# Patient Record
Sex: Female | Born: 1940 | Race: White | Hispanic: No | Smoking: Former smoker
Health system: Southern US, Community
[De-identification: ages and names within clinical notes are randomized; demographics above are authoritative.]

## PROBLEM LIST (undated history)

## (undated) DIAGNOSIS — I10 Essential (primary) hypertension: Secondary | ICD-10-CM

## (undated) DIAGNOSIS — M1990 Unspecified osteoarthritis, unspecified site: Secondary | ICD-10-CM

## (undated) DIAGNOSIS — K219 Gastro-esophageal reflux disease without esophagitis: Secondary | ICD-10-CM

## (undated) DIAGNOSIS — I4891 Unspecified atrial fibrillation: Secondary | ICD-10-CM

## (undated) DIAGNOSIS — N189 Chronic kidney disease, unspecified: Secondary | ICD-10-CM

## (undated) HISTORY — PX: CHOLECYSTECTOMY: SHX55

## (undated) HISTORY — PX: EYE SURGERY: SHX253

## (undated) HISTORY — PX: TONSILLECTOMY: SUR1361

## (undated) HISTORY — PX: BREAST ENHANCEMENT SURGERY: SHX7

---

## 1998-10-26 ENCOUNTER — Other Ambulatory Visit: Admission: RE | Admit: 1998-10-26 | Discharge: 1998-10-26 | Payer: Self-pay | Admitting: Obstetrics and Gynecology

## 2000-03-07 ENCOUNTER — Encounter: Admission: RE | Admit: 2000-03-07 | Discharge: 2000-03-07 | Payer: Self-pay | Admitting: Obstetrics and Gynecology

## 2000-03-07 ENCOUNTER — Encounter: Payer: Self-pay | Admitting: Obstetrics and Gynecology

## 2000-03-15 ENCOUNTER — Other Ambulatory Visit: Admission: RE | Admit: 2000-03-15 | Discharge: 2000-03-15 | Payer: Self-pay | Admitting: Obstetrics and Gynecology

## 2002-05-07 ENCOUNTER — Other Ambulatory Visit: Admission: RE | Admit: 2002-05-07 | Discharge: 2002-05-07 | Payer: Self-pay | Admitting: Obstetrics and Gynecology

## 2004-04-06 ENCOUNTER — Other Ambulatory Visit: Admission: RE | Admit: 2004-04-06 | Discharge: 2004-04-06 | Payer: Self-pay | Admitting: Obstetrics and Gynecology

## 2004-04-08 ENCOUNTER — Ambulatory Visit: Payer: Self-pay | Admitting: Urology

## 2005-04-07 ENCOUNTER — Other Ambulatory Visit: Admission: RE | Admit: 2005-04-07 | Discharge: 2005-04-07 | Payer: Self-pay | Admitting: Obstetrics and Gynecology

## 2005-07-19 ENCOUNTER — Ambulatory Visit: Payer: Self-pay

## 2010-06-29 ENCOUNTER — Ambulatory Visit: Payer: Self-pay | Admitting: Internal Medicine

## 2012-01-12 ENCOUNTER — Ambulatory Visit: Payer: Self-pay | Admitting: Gastroenterology

## 2012-01-13 LAB — PATHOLOGY REPORT

## 2013-05-02 ENCOUNTER — Observation Stay: Payer: Self-pay | Admitting: Internal Medicine

## 2013-05-02 LAB — BASIC METABOLIC PANEL
Anion Gap: 7 (ref 7–16)
BUN: 29 mg/dL — ABNORMAL HIGH (ref 7–18)
Calcium, Total: 9.4 mg/dL (ref 8.5–10.1)
Chloride: 105 mmol/L (ref 98–107)
Co2: 27 mmol/L (ref 21–32)
Creatinine: 1.13 mg/dL (ref 0.60–1.30)
EGFR (African American): 56 — ABNORMAL LOW
EGFR (Non-African Amer.): 49 — ABNORMAL LOW
Glucose: 135 mg/dL — ABNORMAL HIGH (ref 65–99)
Osmolality: 285 (ref 275–301)
Potassium: 3.1 mmol/L — ABNORMAL LOW (ref 3.5–5.1)
Sodium: 139 mmol/L (ref 136–145)

## 2013-05-02 LAB — CK TOTAL AND CKMB (NOT AT ARMC)
CK, Total: 186 U/L (ref 21–215)
CK, Total: 135 U/L (ref 21–215)
CK-MB: 5.3 ng/mL — ABNORMAL HIGH (ref 0.5–3.6)
CK-MB: 4.6 ng/mL — ABNORMAL HIGH (ref 0.5–3.6)

## 2013-05-02 LAB — CBC
HCT: 41.2 % (ref 35.0–47.0)
HGB: 14.4 g/dL (ref 12.0–16.0)
MCH: 30.2 pg (ref 26.0–34.0)
MCHC: 35 g/dL (ref 32.0–36.0)
MCV: 86 fL (ref 80–100)
Platelet: 215 10*3/uL (ref 150–440)
RBC: 4.78 10*6/uL (ref 3.80–5.20)
RDW: 13.5 % (ref 11.5–14.5)
WBC: 7.9 10*3/uL (ref 3.6–11.0)

## 2013-05-02 LAB — HEPATIC FUNCTION PANEL A (ARMC)
Albumin: 3.5 g/dL (ref 3.4–5.0)
Alkaline Phosphatase: 91 U/L (ref 50–136)
Bilirubin, Direct: <0.1 mg/dL (ref 0.00–0.20)
Bilirubin,Total: 0.7 mg/dL (ref 0.2–1.0)
SGOT(AST): 39 U/L — ABNORMAL HIGH (ref 15–37)
SGPT (ALT): 31 U/L (ref 12–78)
Total Protein: 7 g/dL (ref 6.4–8.2)

## 2013-05-02 LAB — LIPASE, BLOOD: Lipase: 233 U/L (ref 73–393)

## 2013-05-02 LAB — TROPONIN I
Troponin-I: <0.02 ng/mL
Troponin-I: <0.02 ng/mL

## 2013-05-02 LAB — TSH: Thyroid Stimulating Horm: 2.51 u[IU]/mL

## 2013-07-02 ENCOUNTER — Ambulatory Visit: Payer: Self-pay | Admitting: Internal Medicine

## 2014-01-08 ENCOUNTER — Encounter (INDEPENDENT_AMBULATORY_CARE_PROVIDER_SITE_OTHER): Payer: Medicare Other | Admitting: Ophthalmology

## 2014-01-08 DIAGNOSIS — I10 Essential (primary) hypertension: Secondary | ICD-10-CM

## 2014-01-08 DIAGNOSIS — H43819 Vitreous degeneration, unspecified eye: Secondary | ICD-10-CM

## 2014-01-08 DIAGNOSIS — H2510 Age-related nuclear cataract, unspecified eye: Secondary | ICD-10-CM

## 2014-01-08 DIAGNOSIS — H35039 Hypertensive retinopathy, unspecified eye: Secondary | ICD-10-CM

## 2014-03-29 ENCOUNTER — Emergency Department: Payer: Self-pay | Admitting: Emergency Medicine

## 2014-03-29 LAB — CBC
HCT: 41.6 % (ref 35.0–47.0)
HGB: 14.1 g/dL (ref 12.0–16.0)
MCH: 29.6 pg (ref 26.0–34.0)
MCHC: 33.9 g/dL (ref 32.0–36.0)
MCV: 87 fL (ref 80–100)
Platelet: 186 10*3/uL (ref 150–440)
RBC: 4.77 10*6/uL (ref 3.80–5.20)
RDW: 14.4 % (ref 11.5–14.5)
WBC: 6.5 10*3/uL (ref 3.6–11.0)

## 2014-03-29 LAB — BASIC METABOLIC PANEL
Anion Gap: 9 (ref 7–16)
BUN: 13 mg/dL (ref 7–18)
CO2: 26 mmol/L (ref 21–32)
CREATININE: 0.88 mg/dL (ref 0.60–1.30)
Calcium, Total: 8.7 mg/dL (ref 8.5–10.1)
Chloride: 101 mmol/L (ref 98–107)
EGFR (Non-African Amer.): >60
Glucose: 132 mg/dL — ABNORMAL HIGH (ref 65–99)
OSMOLALITY: 274 (ref 275–301)
POTASSIUM: 3.2 mmol/L — AB (ref 3.5–5.1)
Sodium: 136 mmol/L (ref 136–145)

## 2014-03-29 LAB — TROPONIN I: Troponin-I: 0.03 ng/mL

## 2014-03-29 LAB — PROTIME-INR
INR: 0.9
Prothrombin Time: 11.8 secs (ref 11.5–14.7)

## 2014-03-29 LAB — PRO B NATRIURETIC PEPTIDE: B-Type Natriuretic Peptide: 154 pg/mL — ABNORMAL HIGH (ref 0–125)

## 2014-10-10 NOTE — H&P (Signed)
PATIENT NAME:  [name], [name] MR#:  [id number] DATE OF BIRTH:  14-May-1941  DATE OF ADMISSION:  05/02/2013  PRIMARY CARE PHYSICIAN:  Dr. Bethann Punches.   REFERRING PHYSICIAN:  Dr. Chiquita Loth.   CHIEF COMPLAINT:  Chest pain and palpitations.   HISTORY OF PRESENT ILLNESS:  [name] is a 74 year old female with a history of hypertension, gastroesophageal reflux disease, presented to the Emergency Department with complaints of palpitations and chest pain, started about midnight.  The patient is well active,  dancing 4 times a week, independent of ADLs and IADLs.  The patient states that today lawn-mowing with her push mower, cleaned all the gutters, did not experience any chest pain.  Went for dancing and came back.  The patient states ate some steak with barbecue sauce.  Came back and looking into the computer.  The patient started to experience skipped beats with a rapid heart rate.  Concerning this, the patient checked her blood pressure.  It showed 206/130.  Rechecked the blood pressure, slightly improved to 102, diastolic and systolic remained over 200.  Concerning this, called husband and is brought to the Emergency Department.  Work-up in the Emergency Department with EKG and cardiac enzymes was completely unremarkable.  The patient did not have any episodes of irregular heartbeats while in the Emergency Department.  Considering the patient's age, the decision is made to observe the patient.   PAST MEDICAL HISTORY: 1.  Hypertension.  2.  Gastroesophageal reflux disease.   PAST SURGICAL HISTORY: 1.  Cholecystectomy.  2.  Lump removal from the left breast.  3.  Tummy tuck.   ALLERGIES:   1.  MELOXICAM. 2.  PREDNISONE.  3.  BIAXIN.   HOME MEDICATIONS: 1.  Ambien 10 mg at bedtime.  2.  Vicodin every six hours.  3.  Ranitidine 150 mg once a day.  4.  Omeprazole 20 mg once a day.  5.  Lisinopril hydrochlorothiazide 1 tablet once a day.   SOCIAL HISTORY:  Smoked heavily in the past,  quit 15 years back.  Smoked about a pack a day.  Drinks alcohol about 2 cocktail drinks at dancing.  No illicit drugs.  Lives by herself, retired.   FAMILY HISTORY:  All the family members have largely lived into their 39s.  Only father died at the age of 67 from the brain tumor.   REVIEW OF SYSTEMS:  CONSTITUTIONAL:  The patient experiences some generalized weakness about a week back.  EYES:  No change in vision.  EARS, NOSE, THROAT:  No change in hearing.  RESPIRATORY:  No cough, shortness of breath.  CARDIOVASCULAR:  Has experienced some chest pain and palpitations, et Karie Soda.  GASTROINTESTINAL:  No nausea, vomiting, abdominal pain.  GENITOURINARY:  No dysuria or hematuria.  ENDOCRINE:  No polyuria or polydipsia.  SKIN:  No rash or lesions. NEUROLOGIC:  No weakness or numbness in any part of the body.   PHYSICAL EXAMINATION: GENERAL:  This is a well-built, well-nourished, age-appropriate female lying down in the bed, not in distress.  VITAL SIGNS:  Temperature 98.2, pulse 70, blood pressure 160/60, respiratory rate of 16, oxygen saturation is 98% on room air.  HEENT:  Head normocephalic, atraumatic.  Eyes, no scleral icterus.  Conjunctivae normal.  Pupils equal and react to light.  Extraocular movements are intact.  Mucous membranes moist.  No pharyngeal erythema.  NECK:  Supple.  No lymphadenopathy.  No carotid bruit.  CHEST:  Has no focal tenderness.  LUNGS:  Bilaterally clear  to auscultation.  HEART:  S1, S2 regular.  No murmurs are heard.  ABDOMEN:  Bowel sounds plus.  Soft, nontender, nondistended.  No hepatosplenomegaly.  EXTREMITIES:  No pedal edema.  Pulses 2+.  NEUROLOGIC:  The patient is alert, oriented to place, person and time.  Cranial nerves II through XII intact.  Motor 5 by 5 in upper and lower extremities.  SKIN:  No rashes or lesions.  MUSCULOSKELETAL:  Good range of motion in all the extremities.   EKG 12-lead:  Normal sinus rhythm with no ST-T wave abnormalities.    LABORATORY DATA:  CBC and CMP are completely within normal limits.  Cardiac enzymes, CK-MB of 5.3.  Troponin less than 0.02.   ASSESSMENT AND PLAN:  [name] is a 74 year old female who comes to the Emergency Department with complaints of chest discomfort, palpitations.  1.  Chest pain.  We will rule out with cardiac enzymes.  If negative, we will obtain stress test prior to discharge.  The patient's risk factors are age, tobacco use. 2.  Hypertension, accelerated.  We will continue to follow up.  Continue the home medications.  3.  Palpitations.  We will continue to follow on telemetry.    4.  Keep the patient on DVT prophylaxis with Lovenox.   TIME SPENT:  45 minutes.    ____________________________ Susa GriffinsPadmaja Evangelina Delancey, MD pv:ea D: 05/02/2013 04:56:57 ET T: 05/02/2013 06:03:35 ET JOB#: [id number]  cc: Susa GriffinsPadmaja Kaedance Magos, MD, <Dictator> Danella PentonMark F. Miller, MD Susa GriffinsPADMAJA Shields Pautz MD ELECTRONICALLY SIGNED 05/19/2013 0:34

## 2014-10-10 NOTE — Discharge Summary (Signed)
PATIENT NAME:  [name], [name] MR#:  [id number] DATE OF BIRTH:  [birth year]  DATE OF ADMISSION:  05/02/2013 DATE OF DISCHARGE: 05/02/2013   DISCHARGE DIAGNOSES:  1.  Tachyarrhythmia with hypokalemia.  2.  Osteoarthritis with lumbar disk disease.  3.  Hypertension.  4.  Gastroesophageal reflux disease.   DISCHARGE MEDICATIONS: Lisinopril/HCT 20/25 mg daily, ranitidine 150 mg at bedtime, Ambien 10 mg at bedtime, Norco 5/325 mg 1 tab q.6 hours p.r.n. pain, omeprazole 20 mg q.a.Garcia., aspirin 81 mg daily, Toprol-XL 25 mg daily and potassium chloride 10 mEq daily.   REASON FOR ADMISSION: A 74 year old female presents with tachyarrhythmic feeling and some mild chest pain. Please see H and P for HPI, past medical history and physical exam.   HOSPITAL COURSE: The patient was admitted. Enzymes were negative. Her symptoms resolved prior to EMS coming. Laboratory evaluation significant for potassium of 3.1. She was replaced with 40 mEq potassium. More than likely, with all of her activities the day prior and then dancing with volume depletion, she became hypokalemic which led to, what sounds like, ventricular ectopy or possibly transient A. fib. She will be on aspirin daily, on low-dose Toprol-XL and electrolyte replacement as the main issue. We will follow up on her potassium and magnesium. Overall prognosis is good. There is no indication for acute coronary syndrome in that she daily has a stress test in blowing leaves and dancing and she has not had any issues as an outpatient. We will proceed with stress testing if clinically warranted. ____________________________ Danella PentonMark F. Jacobi Ryant, MD mfm:aw D: 05/02/2013 07:54:24 ET T: 05/02/2013 08:14:05 ET JOB#: [id number]  cc: Danella PentonMark F. Jillianna Stanek, MD, <Dictator> Kaylan Yates Sherlene ShamsF Rodgerick Gilliand MD ELECTRONICALLY SIGNED 05/03/2013 7:57

## 2014-11-24 ENCOUNTER — Other Ambulatory Visit: Payer: Self-pay | Admitting: Surgery

## 2014-11-24 DIAGNOSIS — M1711 Unilateral primary osteoarthritis, right knee: Secondary | ICD-10-CM

## 2014-11-24 DIAGNOSIS — M4726 Other spondylosis with radiculopathy, lumbar region: Secondary | ICD-10-CM

## 2014-12-01 ENCOUNTER — Ambulatory Visit
Admission: RE | Admit: 2014-12-01 | Discharge: 2014-12-01 | Disposition: A | Discharge status: Home / Self Care | Payer: Medicare Other | Source: Ambulatory Visit | Attending: Surgery | Admitting: Surgery

## 2014-12-01 DIAGNOSIS — M1711 Unilateral primary osteoarthritis, right knee: Secondary | ICD-10-CM | POA: Insufficient documentation

## 2014-12-01 DIAGNOSIS — G544 Lumbosacral root disorders, not elsewhere classified: Secondary | ICD-10-CM | POA: Insufficient documentation

## 2014-12-01 DIAGNOSIS — S83241A Other tear of medial meniscus, current injury, right knee, initial encounter: Secondary | ICD-10-CM | POA: Insufficient documentation

## 2014-12-01 DIAGNOSIS — M5126 Other intervertebral disc displacement, lumbar region: Secondary | ICD-10-CM | POA: Insufficient documentation

## 2014-12-01 DIAGNOSIS — M25461 Effusion, right knee: Secondary | ICD-10-CM | POA: Insufficient documentation

## 2014-12-01 DIAGNOSIS — S83101A Unspecified subluxation of right knee, initial encounter: Secondary | ICD-10-CM | POA: Insufficient documentation

## 2014-12-01 DIAGNOSIS — M4726 Other spondylosis with radiculopathy, lumbar region: Secondary | ICD-10-CM

## 2014-12-01 DIAGNOSIS — M2241 Chondromalacia patellae, right knee: Secondary | ICD-10-CM | POA: Diagnosis not present

## 2014-12-01 DIAGNOSIS — M545 Low back pain: Secondary | ICD-10-CM | POA: Diagnosis present

## 2014-12-01 DIAGNOSIS — M4806 Spinal stenosis, lumbar region: Secondary | ICD-10-CM | POA: Insufficient documentation

## 2014-12-01 DIAGNOSIS — M5416 Radiculopathy, lumbar region: Secondary | ICD-10-CM | POA: Diagnosis present

## 2015-01-24 ENCOUNTER — Emergency Department: Payer: Medicare Other

## 2015-01-24 ENCOUNTER — Emergency Department
Admission: EM | Admit: 2015-01-24 | Discharge: 2015-01-24 | Disposition: A | Discharge status: Home / Self Care | Payer: Medicare Other | Attending: Emergency Medicine | Admitting: Emergency Medicine

## 2015-01-24 DIAGNOSIS — Y9341 Activity, dancing: Secondary | ICD-10-CM | POA: Insufficient documentation

## 2015-01-24 DIAGNOSIS — W010XXA Fall on same level from slipping, tripping and stumbling without subsequent striking against object, initial encounter: Secondary | ICD-10-CM | POA: Insufficient documentation

## 2015-01-24 DIAGNOSIS — S0083XA Contusion of other part of head, initial encounter: Secondary | ICD-10-CM | POA: Insufficient documentation

## 2015-01-24 DIAGNOSIS — S0990XA Unspecified injury of head, initial encounter: Secondary | ICD-10-CM

## 2015-01-24 DIAGNOSIS — Y9289 Other specified places as the place of occurrence of the external cause: Secondary | ICD-10-CM | POA: Diagnosis not present

## 2015-01-24 DIAGNOSIS — S3992XA Unspecified injury of lower back, initial encounter: Secondary | ICD-10-CM | POA: Insufficient documentation

## 2015-01-24 DIAGNOSIS — Y998 Other external cause status: Secondary | ICD-10-CM | POA: Insufficient documentation

## 2015-01-24 DIAGNOSIS — M545 Low back pain, unspecified: Secondary | ICD-10-CM

## 2015-01-24 DIAGNOSIS — W19XXXA Unspecified fall, initial encounter: Secondary | ICD-10-CM

## 2015-01-24 MED ORDER — OXYCODONE-ACETAMINOPHEN 5-325 MG PO TABS
1.0000 | ORAL_TABLET | Freq: Once | ORAL | Status: AC
  Administered 2015-01-24: 1 via ORAL
  Filled 2015-01-24: qty 1

## 2015-01-24 NOTE — ED Notes (Signed)
Patient reports she dancing and hit a bad spot in the floor and she feel backwards and struck head on the floor.  Pt reports she felt dizzy at the time.  Patient reports she took 1/2 of an aspirin today.

## 2015-01-24 NOTE — ED Provider Notes (Signed)
[facility] Emergency Department Provider Note  ____________________________________________  Time seen: Approximately 6:34 AM  I have reviewed the triage vital signs and the nursing notes.   HISTORY  Chief Complaint Head Injury    HPI [name] is a 74 y.o. female who comes in today with a head injury. The patient reports that she was dancing and hit a bad place on the floor where her foot caught and she fell. The patient reports she fell initially on her bottom and hit her head on the floor. She reports that after the fall she felt lightheaded. The patient is concerned because she is older and has a history of breast arthritis. The patient does have a history also of back pain but denies any increased to her back pain since the fall. The patient did not have any loss of consciousness but the back of her head feels sore where she hit. The patient has no blurred vision no neck pain. She reports that her bottom also feels sore possibly from the fall and the hip.The patient reports that her pain is a 4 out of 10 in intensity which is no worse than what it normally is.   No past medical history on file. Osteoarthritis  There are no active problems to display for this patient.   No past surgical history on file.  No current outpatient prescriptions on file.  Allergies Meloxicam and Prednisone  No family history on file.  Social History History  Substance Use Topics  . Smoking status: Not on file  . Smokeless tobacco: Not on file  . Alcohol Use: Not on file    Review of Systems Constitutional: No fever/chills Eyes: No visual changes. ENT: No sore throat. Cardiovascular: Denies chest pain. Respiratory: Denies shortness of breath. Gastrointestinal: No abdominal pain.  No nausea, no vomiting.  No diarrhea.  No constipation. Genitourinary: Negative for dysuria. Musculoskeletal: back pain. Skin: Negative for rash. Neurological: Negative for  headaches, focal weakness or numbness.  10-point ROS otherwise negative.  ____________________________________________   PHYSICAL EXAM:  VITAL SIGNS: ED Triage Vitals  Enc Vitals Group     BP 01/24/15 0015 180/74 mmHg     Pulse Rate 01/24/15 0015 64     Resp 01/24/15 0015 20     Temp 01/24/15 0015 98.6 F (37 C)     Temp Source 01/24/15 0015 Oral     SpO2 01/24/15 0015 97 %     Weight 01/24/15 0015 170 lb (77.111 kg)     Height 01/24/15 0015  (1.626 m)     Head Cir --      Peak Flow --      Pain Score 01/24/15 0013 0     Pain Loc --      Pain Edu? --      Excl. in GC? --    Constitutional: Alert and oriented. Well appearing and in mild distress. Eyes: Conjunctivae are normal. PERRL. EOMI. Head: Normal Hematoma to occiput. Nose: No congestion/rhinnorhea. Mouth/Throat: Mucous membranes are moist.  Oropharynx non-erythematous. Neck: No cervical spine tenderness to palpation. Cardiovascular: Normal rate, regular rhythm. Grossly normal heart sounds.  Good peripheral circulation. Respiratory: Normal respiratory effort.  No retractions. Lungs CTAB. Gastrointestinal: Soft and nontender. No distention. No abdominal bruits. No CVA tenderness. Musculoskeletal: Pelvis stable, no lower extremity tenderness to palpation, no pain with passive range of motion. Mild pain to low spine S1-S2. Neurologic:  Normal speech and language. No gross focal neurologic deficits are appreciated.  Skin:  Skin is warm, dry and intact.  Psychiatric: Mood and affect are normal.   ____________________________________________   LABS (all labs ordered are listed, but only abnormal results are displayed)  Labs Reviewed - No data to display ____________________________________________  EKG  None ____________________________________________  RADIOLOGY  CT head: Normal brain Lumbar spine: No acute lumbar spine fracture or deformity, stable grade 1 L3-for anterolisthesis on degenerative  basis ____________________________________________   PROCEDURES  Procedure(s) performed: None  Critical Care performed: No  ____________________________________________   INITIAL IMPRESSION / ASSESSMENT AND PLAN / ED COURSE  Pertinent labs & imaging results that were available during my care of the patient were reviewed by me and considered in my medical decision making (see chart for details).  The patient is a 74 year old female who comes in tonight after a fall. The patient reports that she does have some low back pain which is not much increased from her typical pain. The patient has had an epidural injection recently. The patient's x-rays are unremarkable. I did the patient a dose of Percocet. At this time the patient be discharged home to follow-up with her primary care physician. Otherwise the patient's imaging is negative. ____________________________________________   FINAL CLINICAL IMPRESSION(S) / ED DIAGNOSES  Final diagnoses:  Fall, initial encounter  Head injury, initial encounter  Midline low back pain without sciatica      Rebecka Apley, MD 01/24/15 [phone]

## 2015-01-24 NOTE — Discharge Instructions (Signed)
Back Pain, Adult Low back pain is very common. About 1 in 5 people have back pain.The cause of low back pain is rarely dangerous. The pain often gets better over time.About half of people with a sudden onset of back pain feel better in just 2 weeks. About 8 in 10 people feel better by 6 weeks.  CAUSES Some common causes of back pain include:  Strain of the muscles or ligaments supporting the spine.  Wear and tear (degeneration) of the spinal discs.  Arthritis.  Direct injury to the back. DIAGNOSIS Most of the time, the direct cause of low back pain is not known.However, back pain can be treated effectively even when the exact cause of the pain is unknown.Answering your caregiver's questions about your overall health and symptoms is one of the most accurate ways to make sure the cause of your pain is not dangerous. If your caregiver needs more information, he or she may order lab work or imaging tests (X-rays or MRIs).However, even if imaging tests show changes in your back, this usually does not require surgery. HOME CARE INSTRUCTIONS For many people, back pain returns.Since low back pain is rarely dangerous, it is often a condition that people can learn to [facility] their own.   Remain active. It is stressful on the back to sit or stand in one place. Do not sit, drive, or stand in one place for more than 30 minutes at a time. Take short walks on level surfaces as soon as pain allows.Try to increase the length of time you walk each day.  Do not stay in bed.Resting more than 1 or 2 days can delay your recovery.  Do not avoid exercise or work.Your body is made to move.It is not dangerous to be active, even though your back may hurt.Your back will likely heal faster if you return to being active before your pain is gone.  Pay attention to your body when you bend and lift. Many people have less discomfortwhen lifting if they bend their knees, keep the load close to their bodies,and  avoid twisting. Often, the most comfortable positions are those that put less stress on your recovering back.  Find a comfortable position to sleep. Use a firm mattress and lie on your side with your knees slightly bent. If you lie on your back, put a pillow under your knees.  Only take over-the-counter or prescription medicines as directed by your caregiver. Over-the-counter medicines to reduce pain and inflammation are often the most helpful.Your caregiver may prescribe muscle relaxant drugs.These medicines help dull your pain so you can more quickly return to your normal activities and healthy exercise.  Put ice on the injured area.  Put ice in a plastic bag.  Place a towel between your skin and the bag.  Leave the ice on for 15-20 minutes, 03-04 times a day for the first 2 to 3 days. After that, ice and heat may be alternated to reduce pain and spasms.  Ask your caregiver about trying back exercises and gentle massage. This may be of some benefit.  Avoid feeling anxious or stressed.Stress increases muscle tension and can worsen back pain.It is important to recognize when you are anxious or stressed and learn ways to manage it.Exercise is a great option. SEEK MEDICAL CARE IF:  You have pain that is not relieved with rest or medicine.  You have pain that does not improve in 1 week.  You have new symptoms.  You are generally not feeling well. SEEK  IMMEDIATE MEDICAL CARE IF:   You have pain that radiates from your back into your legs.  You develop new bowel or bladder control problems.  You have unusual weakness or numbness in your arms or legs.  You develop nausea or vomiting.  You develop abdominal pain.  You feel faint. Document Released: 06/06/2005 Document Revised: 12/06/2011 Document Reviewed: 10/08/2013 [facility] Patient Information 2015 [place], [place]. This information is not intended to replace advice given to you by your health care provider. Make sure you  discuss any questions you have with your health care provider.  Concussion A concussion, or closed-head injury, is a brain injury caused by a direct blow to the head or by a quick and sudden movement (jolt) of the head or neck. Concussions are usually not life-threatening. Even so, the effects of a concussion can be serious. If you have had a concussion before, you are more likely to experience concussion-like symptoms after a direct blow to the head.  CAUSES  Direct blow to the head, such as from running into another player during a soccer game, being hit in a fight, or hitting your head on a hard surface.  A jolt of the head or neck that causes the brain to move back and forth inside the skull, such as in a car crash. SIGNS AND SYMPTOMS The signs of a concussion can be hard to notice. Early on, they may be missed by you, family members, and health care providers. You may look fine but act or feel differently. Symptoms are usually temporary, but they may last for days, weeks, or even longer. Some symptoms may appear right away while others may not show up for hours or days. Every head injury is different. Symptoms include:  Mild to moderate headaches that will not go away.  A feeling of pressure inside your head.  Having more trouble than usual:  Learning or remembering things you have heard.  Answering questions.  Paying attention or concentrating.  Organizing daily tasks.  Making decisions and solving problems.  Slowness in thinking, acting or reacting, speaking, or reading.  Getting lost or being easily confused.  Feeling tired all the time or lacking energy (fatigued).  Feeling drowsy.  Sleep disturbances.  Sleeping more than usual.  Sleeping less than usual.  Trouble falling asleep.  Trouble sleeping (insomnia).  Loss of balance or feeling lightheaded or dizzy.  Nausea or vomiting.  Numbness or tingling.  Increased sensitivity  to:  Sounds.  Lights.  Distractions.  Vision problems or eyes that tire easily.  Diminished sense of taste or smell.  Ringing in the ears.  Mood changes such as feeling sad or anxious.  Becoming easily irritated or angry for little or no reason.  Lack of motivation.  Seeing or hearing things other people do not see or hear (hallucinations). DIAGNOSIS Your health care provider can usually diagnose a concussion based on a description of your injury and symptoms. He or she will ask whether you passed out (lost consciousness) and whether you are having trouble remembering events that happened right before and during your injury. Your evaluation might include:  A brain scan to look for signs of injury to the brain. Even if the test shows no injury, you may still have a concussion.  Blood tests to be sure other problems are not present. TREATMENT  Concussions are usually treated in an emergency department, in urgent care, or at a clinic. You may need to stay in the hospital overnight for further treatment.  Tell your health care provider if you are taking any medicines, including prescription medicines, over-the-counter medicines, and natural remedies. Some medicines, such as blood thinners (anticoagulants) and aspirin, may increase the chance of complications. Also tell your health care provider whether you have had alcohol or are taking illegal drugs. This information may affect treatment.  Your health care provider will send you home with important instructions to follow.  How fast you will recover from a concussion depends on many factors. These factors include how severe your concussion is, what part of your brain was injured, your age, and how healthy you were before the concussion.  Most people with mild injuries recover fully. Recovery can take time. In general, recovery is slower in older persons. Also, persons who have had a concussion in the past or have other medical  problems may find that it takes longer to recover from their current injury. HOME CARE INSTRUCTIONS General Instructions  Carefully follow the directions your health care provider gave you.  Only take over-the-counter or prescription medicines for pain, discomfort, or fever as directed by your health care provider.  Take only those medicines that your health care provider has approved.  Do not drink alcohol until your health care provider says you are well enough to do so. Alcohol and certain other drugs may slow your recovery and can put you at risk of further injury.  If it is harder than usual to remember things, write them down.  If you are easily distracted, try to do one thing at a time. For example, do not try to watch TV while fixing dinner.  Talk with family members or close friends when making important decisions.  Keep all follow-up appointments. Repeated evaluation of your symptoms is recommended for your recovery.  Watch your symptoms and tell others to do the same. Complications sometimes occur after a concussion. Older adults with a brain injury may have a higher risk of serious complications, such as a blood clot on the brain.  Tell your teachers, school nurse, school counselor, coach, athletic trainer, or work Production designer, theatre/television/film about your injury, symptoms, and restrictions. Tell them about what you can or cannot do. They should watch for:  Increased problems with attention or concentration.  Increased difficulty remembering or learning new information.  Increased time needed to complete tasks or assignments.  Increased irritability or decreased ability to cope with stress.  Increased symptoms.  Rest. Rest helps the brain to heal. Make sure you:  Get plenty of sleep at night. Avoid staying up late at night.  Keep the same bedtime hours on weekends and weekdays.  Rest during the day. Take daytime naps or rest breaks when you feel tired.  Limit activities that require a  lot of thought or concentration. These include:  Doing homework or job-related work.  Watching TV.  Working on the computer.  Avoid any situation where there is potential for another head injury (football, hockey, soccer, basketball, martial arts, downhill snow sports and horseback riding). Your condition will get worse every time you experience a concussion. You should avoid these activities until you are evaluated by the appropriate follow-up health care providers. Returning To Your Regular Activities You will need to return to your normal activities slowly, not all at once. You must give your body and brain enough time for recovery.  Do not return to sports or other athletic activities until your health care provider tells you it is safe to do so.  Ask your health care provider when  you can drive, ride a bicycle, or operate heavy machinery. Your ability to react may be slower after a brain injury. Never do these activities if you are dizzy.  Ask your health care provider about when you can return to work or school. Preventing Another Concussion It is very important to avoid another brain injury, especially before you have recovered. In rare cases, another injury can lead to permanent brain damage, brain swelling, or death. The risk of this is greatest during the first 7-10 days after a head injury. Avoid injuries by:  Wearing a seat belt when riding in a car.  Drinking alcohol only in moderation.  Wearing a helmet when biking, skiing, skateboarding, skating, or doing similar activities.  Avoiding activities that could lead to a second concussion, such as contact or recreational sports, until your health care provider says it is okay.  Taking safety measures in your home.  Remove clutter and tripping hazards from floors and stairways.  Use grab bars in bathrooms and handrails by stairs.  Place non-slip mats on floors and in bathtubs.  Improve lighting in dim areas. SEEK MEDICAL  CARE IF:  You have increased problems paying attention or concentrating.  You have increased difficulty remembering or learning new information.  You need more time to complete tasks or assignments than before.  You have increased irritability or decreased ability to cope with stress.  You have more symptoms than before. Seek medical care if you have any of the following symptoms for more than 2 weeks after your injury:  Lasting (chronic) headaches.  Dizziness or balance problems.  Nausea.  Vision problems.  Increased sensitivity to noise or light.  Depression or mood swings.  Anxiety or irritability.  Memory problems.  Difficulty concentrating or paying attention.  Sleep problems.  Feeling tired all the time. SEEK IMMEDIATE MEDICAL CARE IF:  You have severe or worsening headaches. These may be a sign of a blood clot in the brain.  You have weakness (even if only in one hand, leg, or part of the face).  You have numbness.  You have decreased coordination.  You vomit repeatedly.  You have increased sleepiness.  One pupil is larger than the other.  You have convulsions.  You have slurred speech.  You have increased confusion. This may be a sign of a blood clot in the brain.  You have increased restlessness, agitation, or irritability.  You are unable to recognize people or places.  You have neck pain.  It is difficult to wake you up.  You have unusual behavior changes.  You lose consciousness. MAKE SURE YOU:  Understand these instructions.  Will watch your condition.  Will get help right away if you are not doing well or get worse. Document Released: 08/27/2003 Document Revised: 06/11/2013 Document Reviewed: 12/27/2012 [facility] Patient Information 2015 [place], [place]. This information is not intended to replace advice given to you by your health care provider. Make sure you discuss any questions you have with your health care provider.  Fall  Prevention and Home Safety Falls cause injuries and can affect all age groups. It is possible to use preventive measures to significantly decrease the likelihood of falls. There are many simple measures which can make your home safer and prevent falls. OUTDOORS  Repair cracks and edges of walkways and driveways.  Remove high doorway thresholds.  Trim shrubbery on the main path into your home.  Have good outside lighting.  Clear walkways of tools, rocks, debris, and clutter.  Check  that handrails are not broken and are securely fastened. Both sides of steps should have handrails.  Have leaves, snow, and ice cleared regularly.  Use sand or salt on walkways during winter months.  In the garage, clean up grease or oil spills. BATHROOM  Install night lights.  Install grab bars by the toilet and in the tub and shower.  Use non-skid mats or decals in the tub or shower.  Place a plastic non-slip stool in the shower to sit on, if needed.  Keep floors dry and clean up all water on the floor immediately.  Remove soap buildup in the tub or shower on a regular basis.  Secure bath mats with non-slip, double-sided rug tape.  Remove throw rugs and tripping hazards from the floors. BEDROOMS  Install night lights.  Make sure a bedside light is easy to reach.  Do not use oversized bedding.  Keep a telephone by your bedside.  Have a firm chair with side arms to use for getting dressed.  Remove throw rugs and tripping hazards from the floor. KITCHEN  Keep handles on pots and pans turned toward the center of the stove. Use back burners when possible.  Clean up spills quickly and allow time for drying.  Avoid walking on wet floors.  Avoid hot utensils and knives.  Position shelves so they are not too high or low.  Place commonly used objects within easy reach.  If necessary, use a sturdy step stool with a grab bar when reaching.  Keep electrical cables out of the  way.  Do not use floor polish or wax that makes floors slippery. If you must use wax, use non-skid floor wax.  Remove throw rugs and tripping hazards from the floor. STAIRWAYS  Never leave objects on stairs.  Place handrails on both sides of stairways and use them. Fix any loose handrails. Make sure handrails on both sides of the stairways are as long as the stairs.  Check carpeting to make sure it is firmly attached along stairs. Make repairs to worn or loose carpet promptly.  Avoid placing throw rugs at the top or bottom of stairways, or properly secure the rug with carpet tape to prevent slippage. Get rid of throw rugs, if possible.  Have an electrician put in a light switch at the top and bottom of the stairs. OTHER FALL PREVENTION TIPS  Wear low-heel or rubber-soled shoes that are supportive and fit well. Wear closed toe shoes.  When using a stepladder, make sure it is fully opened and both spreaders are firmly locked. Do not climb a closed stepladder.  Add color or contrast paint or tape to grab bars and handrails in your home. Place contrasting color strips on first and last steps.  Learn and use mobility aids as needed. Install an electrical emergency response system.  Turn on lights to avoid dark areas. Replace light bulbs that burn out immediately. Get light switches that glow.  Arrange furniture to create clear pathways. Keep furniture in the same place.  Firmly attach carpet with non-skid or double-sided tape.  Eliminate uneven floor surfaces.  Select a carpet pattern that does not visually hide the edge of steps.  Be aware of all pets. OTHER HOME SAFETY TIPS  Set the water temperature for 120 F (48.8 C).  Keep emergency numbers on or near the telephone.  Keep smoke detectors on every level of the home and near sleeping areas. Document Released: 05/27/2002 Document Revised: 12/06/2011 Document Reviewed: 08/26/2011 [employer] Patient Information  2015  [employer]. This information is not intended to replace advice given to you by your health care provider. Make sure you discuss any questions you have with your health care provider.

## 2015-01-24 NOTE — ED Notes (Signed)
Pt reports falling last night while dancing and hitting the back of her head. Pt denies any N/V, denies any LOC. Pt denies any loss of motor function or sensory perception in her extremities. No visible injury is observed, pt reports mild to moderate tenderness at the back of her head.

## 2015-02-12 ENCOUNTER — Encounter
Admission: RE | Admit: 2015-02-12 | Discharge: 2015-02-12 | Disposition: A | Discharge status: Home / Self Care | Payer: Medicare Other | Source: Ambulatory Visit | Attending: Surgery | Admitting: Surgery

## 2015-02-12 DIAGNOSIS — Z01810 Encounter for preprocedural cardiovascular examination: Secondary | ICD-10-CM | POA: Insufficient documentation

## 2015-02-12 HISTORY — DX: Gastro-esophageal reflux disease without esophagitis: K21.9

## 2015-02-12 HISTORY — DX: Unspecified atrial fibrillation: I48.91

## 2015-02-12 HISTORY — DX: Unspecified osteoarthritis, unspecified site: M19.90

## 2015-02-12 HISTORY — DX: Chronic kidney disease, unspecified: N18.9

## 2015-02-12 HISTORY — DX: Essential (primary) hypertension: I10

## 2015-02-12 LAB — SURGICAL PCR SCREEN
MRSA, PCR: NEGATIVE
STAPHYLOCOCCUS AUREUS: NEGATIVE

## 2015-02-12 LAB — PROTIME-INR
INR: 0.91
PROTHROMBIN TIME: 12.5 s (ref 11.4–15.0)

## 2015-02-12 LAB — CBC
HCT: 39.7 % (ref 35.0–47.0)
HEMOGLOBIN: 13.2 g/dL (ref 12.0–16.0)
MCH: 27.3 pg (ref 26.0–34.0)
MCHC: 33.3 g/dL (ref 32.0–36.0)
MCV: 82.2 fL (ref 80.0–100.0)
PLATELETS: 214 10*3/uL (ref 150–440)
RBC: 4.83 MIL/uL (ref 3.80–5.20)
RDW: 14.4 % (ref 11.5–14.5)
WBC: 5.9 10*3/uL (ref 3.6–11.0)

## 2015-02-12 LAB — URINALYSIS COMPLETE WITH MICROSCOPIC (ARMC ONLY)
BILIRUBIN URINE: NEGATIVE
Glucose, UA: NEGATIVE mg/dL
HGB URINE DIPSTICK: NEGATIVE
KETONES UR: NEGATIVE mg/dL
NITRITE: NEGATIVE
PROTEIN: NEGATIVE mg/dL
Specific Gravity, Urine: 1.023 (ref 1.005–1.030)
pH: 5 (ref 5.0–8.0)

## 2015-02-12 LAB — BASIC METABOLIC PANEL
Anion gap: 7 (ref 5–15)
BUN: 12 mg/dL (ref 6–20)
CO2: 29 mmol/L (ref 22–32)
CREATININE: 0.77 mg/dL (ref 0.44–1.00)
Calcium: 9.9 mg/dL (ref 8.9–10.3)
Chloride: 105 mmol/L (ref 101–111)
GFR calc Af Amer: >60 mL/min (ref >60)
Glucose, Bld: 91 mg/dL (ref 65–99)
Potassium: 3.7 mmol/L (ref 3.5–5.1)
SODIUM: 141 mmol/L (ref 135–145)

## 2015-02-12 LAB — ABO/RH: ABO/RH(D): O NEG

## 2015-02-12 LAB — APTT: aPTT: 26 seconds (ref 24–36)

## 2015-02-12 LAB — SEDIMENTATION RATE: Sed Rate: 16 mm/hr (ref 0–30)

## 2015-02-12 LAB — TYPE AND SCREEN
ABO/RH(D): O NEG
Antibody Screen: NEGATIVE

## 2015-02-12 NOTE — Patient Instructions (Signed)
  Your procedure is scheduled on: February 24, 2015 (Tuesday) Report to Day Surgery.[facility]) To find out your arrival time please call [phone] between 1PM - 3PM on February 20, 2015(Friday).  Remember: Instructions that are not followed completely may result in serious medical risk, up to and including death, or upon the discretion of your surgeon and anesthesiologist your surgery may need to be rescheduled.    __x__ 1. Do not eat food or drink liquids after midnight. No gum chewing or hard candies.     __x__ 2. No Alcohol for 24 hours before or after surgery.   ____ 3. Bring all medications with you on the day of surgery if instructed.    _x___ 4. Notify your doctor if there is any change in your medical condition     (cold, fever, infections).     Do not wear jewelry, make-up, hairpins, clips or nail polish.  Do not wear lotions, powders, or perfumes. You may wear deodorant.  Do not shave 48 hours prior to surgery. Men may shave face and neck.  Do not bring valuables to the hospital.    [facility] is not responsible for any belongings or valuables.               Contacts, dentures or bridgework may not be worn into surgery.  Leave your suitcase in the car. After surgery it may be brought to your room.  For patients admitted to the hospital, discharge time is determined by your                treatment team.   Patients discharged the day of surgery will not be allowed to drive home.   Please read over the following fact sheets that you were given:   MRSA Information and Surgical Site Infection Prevention   ____ Take these medicines the morning of surgery with A SIP OF WATER:    1. Omeprazole  2.   3.   4.  5.  6.  ____ Fleet Enema (as directed)   __x__ Use CHG Soap as directed  ____ Use inhalers on the day of surgery  ____ Stop metformin 2 days prior to surgery    ____ Take 1/2 of usual insulin dose the night before surgery and none on the  morning of surgery.   __x__ Stop Coumadin/Plavix/aspirin on (STOP ASPIRIN ONE WEEK PRIOR TO SURGERY)  ____ Stop Anti-inflammatories on    __x__ Stop supplements until after surgery.  (STOP ALIGN, BIOTIN, CRANBERRY, OSTEO-BI FLEX NOW) ____ Bring C-Pap to the hospital.

## 2015-02-24 ENCOUNTER — Encounter: Payer: Self-pay | Admitting: *Deleted

## 2015-02-24 ENCOUNTER — Inpatient Hospital Stay: Payer: Medicare Other | Admitting: Certified Registered Nurse Anesthetist

## 2015-02-24 ENCOUNTER — Inpatient Hospital Stay
Admission: RE | Admit: 2015-02-24 | Discharge: 2015-02-27 | DRG: 470 | Disposition: A | Discharge status: Skilled Nursing Facility | Payer: Medicare Other | Source: Ambulatory Visit | Attending: Surgery | Admitting: Surgery

## 2015-02-24 ENCOUNTER — Encounter
Admission: RE | Disposition: A | Discharge status: Skilled Nursing Facility | Payer: Self-pay | Source: Ambulatory Visit | Attending: Surgery

## 2015-02-24 DIAGNOSIS — M179 Osteoarthritis of knee, unspecified: Principal | ICD-10-CM | POA: Diagnosis present

## 2015-02-24 DIAGNOSIS — I129 Hypertensive chronic kidney disease with stage 1 through stage 4 chronic kidney disease, or unspecified chronic kidney disease: Secondary | ICD-10-CM | POA: Diagnosis present

## 2015-02-24 DIAGNOSIS — K219 Gastro-esophageal reflux disease without esophagitis: Secondary | ICD-10-CM | POA: Diagnosis present

## 2015-02-24 DIAGNOSIS — Z87891 Personal history of nicotine dependence: Secondary | ICD-10-CM | POA: Diagnosis not present

## 2015-02-24 DIAGNOSIS — I4891 Unspecified atrial fibrillation: Secondary | ICD-10-CM | POA: Diagnosis present

## 2015-02-24 DIAGNOSIS — R5082 Postprocedural fever: Secondary | ICD-10-CM

## 2015-02-24 DIAGNOSIS — N189 Chronic kidney disease, unspecified: Secondary | ICD-10-CM | POA: Diagnosis present

## 2015-02-24 DIAGNOSIS — R109 Unspecified abdominal pain: Secondary | ICD-10-CM

## 2015-02-24 DIAGNOSIS — Z79899 Other long term (current) drug therapy: Secondary | ICD-10-CM | POA: Diagnosis not present

## 2015-02-24 DIAGNOSIS — Z96651 Presence of right artificial knee joint: Secondary | ICD-10-CM

## 2015-02-24 HISTORY — PX: PARTIAL KNEE ARTHROPLASTY: SHX2174

## 2015-02-24 SURGERY — ARTHROPLASTY, KNEE, UNICOMPARTMENTAL
Anesthesia: Spinal | Site: Knee | Laterality: Right | Wound class: Clean

## 2015-02-24 MED ORDER — ADULT MULTIVITAMIN W/MINERALS CH
1.0000 | ORAL_TABLET | Freq: Every day | ORAL | Status: DC
  Administered 2015-02-27: 1 via ORAL
  Filled 2015-02-24 (×5): qty 1

## 2015-02-24 MED ORDER — FLEET ENEMA 7-19 GM/118ML RE ENEM: 1.0000 | ENEMA | Freq: Once | RECTAL | Status: DC | PRN

## 2015-02-24 MED ORDER — CLINDAMYCIN PHOSPHATE 600 MG/50ML IV SOLN
600.0000 mg | Freq: Four times a day (QID) | INTRAVENOUS | Status: AC
  Administered 2015-02-24 (×3): 600 mg via INTRAVENOUS
  Filled 2015-02-24 (×3): qty 50

## 2015-02-24 MED ORDER — LIDOCAINE HCL (CARDIAC) 20 MG/ML IV SOLN
INTRAVENOUS | Status: DC | PRN
  Administered 2015-02-24: 25 mg via INTRAVENOUS

## 2015-02-24 MED ORDER — ZOLPIDEM TARTRATE 5 MG PO TABS
10.0000 mg | ORAL_TABLET | Freq: Every evening | ORAL | Status: DC | PRN
  Administered 2015-02-24 – 2015-02-25 (×2): 10 mg via ORAL
  Filled 2015-02-24 (×2): qty 2

## 2015-02-24 MED ORDER — BISACODYL 5 MG PO TBEC
5.0000 mg | DELAYED_RELEASE_TABLET | Freq: Every day | ORAL | Status: DC
  Administered 2015-02-26 – 2015-02-27 (×2): 5 mg via ORAL
  Filled 2015-02-24 (×3): qty 1

## 2015-02-24 MED ORDER — CRANBERRY 400 MG PO CAPS: 1.0000 | ORAL_CAPSULE | Freq: Every day | ORAL | Status: DC

## 2015-02-24 MED ORDER — SODIUM CHLORIDE 0.9 % IV SOLN
INTRAVENOUS | Status: DC | PRN
  Administered 2015-02-24: 60 mL

## 2015-02-24 MED ORDER — MIDAZOLAM HCL 5 MG/5ML IJ SOLN
INTRAMUSCULAR | Status: DC | PRN
  Administered 2015-02-24: 2 mg via INTRAVENOUS

## 2015-02-24 MED ORDER — EPHEDRINE SULFATE 50 MG/ML IJ SOLN
INTRAMUSCULAR | Status: DC | PRN
  Administered 2015-02-24: 5 mg via INTRAVENOUS

## 2015-02-24 MED ORDER — BUPIVACAINE-EPINEPHRINE (PF) 0.5% -1:200000 IJ SOLN
INTRAMUSCULAR | Status: DC | PRN
  Administered 2015-02-24: 30 mL via PERINEURAL

## 2015-02-24 MED ORDER — PHENYLEPHRINE HCL 10 MG/ML IJ SOLN
INTRAMUSCULAR | Status: DC | PRN
  Administered 2015-02-24: 200 ug via INTRAVENOUS
  Administered 2015-02-24 (×2): 100 ug via INTRAVENOUS
  Administered 2015-02-24: 200 ug via INTRAVENOUS
  Administered 2015-02-24: 100 ug via INTRAVENOUS

## 2015-02-24 MED ORDER — FENTANYL CITRATE (PF) 100 MCG/2ML IJ SOLN
25.0000 ug | INTRAMUSCULAR | Status: AC | PRN
  Administered 2015-02-24 (×6): 25 ug via INTRAVENOUS

## 2015-02-24 MED ORDER — METOCLOPRAMIDE HCL 5 MG/ML IJ SOLN
5.0000 mg | Freq: Three times a day (TID) | INTRAMUSCULAR | Status: DC | PRN
  Administered 2015-02-25: 10 mg via INTRAVENOUS
  Filled 2015-02-24: qty 2

## 2015-02-24 MED ORDER — FENTANYL CITRATE (PF) 100 MCG/2ML IJ SOLN
INTRAMUSCULAR | Status: DC | PRN
  Administered 2015-02-24: 100 ug via INTRAVENOUS

## 2015-02-24 MED ORDER — PROPOFOL 10 MG/ML IV BOLUS
INTRAVENOUS | Status: DC | PRN
  Administered 2015-02-24: 20 mg via INTRAVENOUS

## 2015-02-24 MED ORDER — DIPHENHYDRAMINE HCL 12.5 MG/5ML PO ELIX: 12.5000 mg | ORAL_SOLUTION | ORAL | Status: DC | PRN

## 2015-02-24 MED ORDER — HYDROMORPHONE HCL 1 MG/ML IJ SOLN
0.5000 mg | INTRAMUSCULAR | Status: DC | PRN
  Administered 2015-02-24 (×3): 0.5 mg via INTRAVENOUS
  Administered 2015-02-24: 1 mg via INTRAVENOUS
  Administered 2015-02-25: 0.5 mg via INTRAVENOUS
  Filled 2015-02-24 (×6): qty 1

## 2015-02-24 MED ORDER — NEOMYCIN-POLYMYXIN B GU 40-200000 IR SOLN
Status: DC | PRN
  Administered 2015-02-24: 16 mL

## 2015-02-24 MED ORDER — CLINDAMYCIN PHOSPHATE 900 MG/50ML IV SOLN
900.0000 mg | Freq: Once | INTRAVENOUS | Status: AC
  Administered 2015-02-24: 900 mg via INTRAVENOUS

## 2015-02-24 MED ORDER — MAGNESIUM HYDROXIDE 400 MG/5ML PO SUSP: 30.0000 mL | Freq: Every day | ORAL | Status: DC | PRN

## 2015-02-24 MED ORDER — CALCIUM CARBONATE-VITAMIN D 500-200 MG-UNIT PO TABS
1.0000 | ORAL_TABLET | Freq: Every day | ORAL | Status: DC
  Administered 2015-02-26 – 2015-02-27 (×2): 1 via ORAL
  Filled 2015-02-24 (×3): qty 1

## 2015-02-24 MED ORDER — ALIGN 4 MG PO CAPS: 1.0000 | ORAL_CAPSULE | Freq: Every day | ORAL | Status: DC

## 2015-02-24 MED ORDER — POTASSIUM CHLORIDE CRYS ER 10 MEQ PO TBCR
10.0000 meq | EXTENDED_RELEASE_TABLET | Freq: Every day | ORAL | Status: DC
  Administered 2015-02-26 – 2015-02-27 (×2): 10 meq via ORAL
  Filled 2015-02-24 (×3): qty 1

## 2015-02-24 MED ORDER — BIOTIN 5000 MCG PO CAPS: 1.0000 | ORAL_CAPSULE | Freq: Every day | ORAL | Status: DC

## 2015-02-24 MED ORDER — ONDANSETRON HCL 4 MG/2ML IJ SOLN
INTRAMUSCULAR | Status: DC | PRN
  Administered 2015-02-24: 4 mg via INTRAVENOUS

## 2015-02-24 MED ORDER — ASPIRIN EC 81 MG PO TBEC
81.0000 mg | DELAYED_RELEASE_TABLET | Freq: Every day | ORAL | Status: DC
  Administered 2015-02-24 – 2015-02-27 (×3): 81 mg via ORAL
  Filled 2015-02-24 (×3): qty 1

## 2015-02-24 MED ORDER — ACETAMINOPHEN 325 MG PO TABS
650.0000 mg | ORAL_TABLET | Freq: Four times a day (QID) | ORAL | Status: DC | PRN
  Administered 2015-02-26: 650 mg via ORAL
  Filled 2015-02-24: qty 2

## 2015-02-24 MED ORDER — TRANEXAMIC ACID 1000 MG/10ML IV SOLN
1000.0000 mg | INTRAVENOUS | Status: DC | PRN
  Administered 2015-02-24: 1000 mg via INTRAVENOUS

## 2015-02-24 MED ORDER — ONDANSETRON HCL 4 MG/2ML IJ SOLN: 4.0000 mg | Freq: Once | INTRAMUSCULAR | Status: DC | PRN

## 2015-02-24 MED ORDER — GLYCOPYRROLATE 0.2 MG/ML IJ SOLN
INTRAMUSCULAR | Status: DC | PRN
  Administered 2015-02-24: .15 mg via INTRAVENOUS

## 2015-02-24 MED ORDER — ONDANSETRON HCL 4 MG PO TABS
4.0000 mg | ORAL_TABLET | Freq: Four times a day (QID) | ORAL | Status: DC | PRN
  Administered 2015-02-26 – 2015-02-27 (×2): 4 mg via ORAL
  Filled 2015-02-24 (×2): qty 1

## 2015-02-24 MED ORDER — BISACODYL 10 MG RE SUPP
10.0000 mg | Freq: Every day | RECTAL | Status: DC | PRN
  Administered 2015-02-25: 10 mg via RECTAL
  Filled 2015-02-24: qty 1

## 2015-02-24 MED ORDER — INFLUENZA VAC SPLIT QUAD 0.5 ML IM SUSY
0.5000 mL | PREFILLED_SYRINGE | INTRAMUSCULAR | Status: AC
  Administered 2015-02-26: 0.5 mL via INTRAMUSCULAR
  Filled 2015-02-24: qty 0.5

## 2015-02-24 MED ORDER — OXYCODONE HCL 5 MG PO TABS
5.0000 mg | ORAL_TABLET | ORAL | Status: DC | PRN
  Administered 2015-02-24 – 2015-02-25 (×7): 5 mg via ORAL
  Administered 2015-02-26 – 2015-02-27 (×6): 10 mg via ORAL
  Filled 2015-02-24 (×2): qty 1
  Filled 2015-02-24 (×2): qty 2
  Filled 2015-02-24: qty 1
  Filled 2015-02-24: qty 2
  Filled 2015-02-24: qty 1
  Filled 2015-02-24 (×2): qty 2
  Filled 2015-02-24: qty 1
  Filled 2015-02-24: qty 2
  Filled 2015-02-24: qty 1
  Filled 2015-02-24 (×2): qty 2

## 2015-02-24 MED ORDER — ESTRADIOL 0.1 MG/GM VA CREA
1.0000 | TOPICAL_CREAM | Freq: Every day | VAGINAL | Status: DC
  Administered 2015-02-25 – 2015-02-26 (×2): 1 via VAGINAL
  Filled 2015-02-24: qty 42.5

## 2015-02-24 MED ORDER — KCL IN DEXTROSE-NACL 20-5-0.9 MEQ/L-%-% IV SOLN
INTRAVENOUS | Status: DC
  Administered 2015-02-24 – 2015-02-25 (×3): via INTRAVENOUS
  Filled 2015-02-24 (×10): qty 1000

## 2015-02-24 MED ORDER — LACTATED RINGERS IV SOLN
INTRAVENOUS | Status: DC
  Administered 2015-02-24 (×2): via INTRAVENOUS

## 2015-02-24 MED ORDER — LISINOPRIL 20 MG PO TABS
20.0000 mg | ORAL_TABLET | Freq: Every day | ORAL | Status: DC
  Administered 2015-02-24 – 2015-02-26 (×2): 20 mg via ORAL
  Filled 2015-02-24 (×3): qty 1

## 2015-02-24 MED ORDER — OSTEO BI-FLEX TRIPLE STRENGTH PO TABS: 1.0000 | ORAL_TABLET | Freq: Two times a day (BID) | ORAL | Status: DC

## 2015-02-24 MED ORDER — PANTOPRAZOLE SODIUM 40 MG PO TBEC
40.0000 mg | DELAYED_RELEASE_TABLET | Freq: Two times a day (BID) | ORAL | Status: DC
  Administered 2015-02-24 – 2015-02-27 (×5): 40 mg via ORAL
  Filled 2015-02-24 (×6): qty 1

## 2015-02-24 MED ORDER — NYSTATIN-TRIAMCINOLONE 100000-0.1 UNIT/GM-% EX CREA: 1.0000 "application " | TOPICAL_CREAM | CUTANEOUS | Status: DC | PRN

## 2015-02-24 MED ORDER — DOCUSATE SODIUM 100 MG PO CAPS
100.0000 mg | ORAL_CAPSULE | Freq: Two times a day (BID) | ORAL | Status: DC
  Administered 2015-02-24 – 2015-02-27 (×6): 100 mg via ORAL
  Filled 2015-02-24 (×7): qty 1

## 2015-02-24 MED ORDER — FLUTICASONE PROPIONATE 50 MCG/ACT NA SUSP
1.0000 | Freq: Every day | NASAL | Status: DC
  Administered 2015-02-25 – 2015-02-26 (×2): 1 via NASAL
  Filled 2015-02-24: qty 16

## 2015-02-24 MED ORDER — ONDANSETRON HCL 4 MG/2ML IJ SOLN
4.0000 mg | Freq: Four times a day (QID) | INTRAMUSCULAR | Status: DC | PRN
  Administered 2015-02-24 – 2015-02-27 (×4): 4 mg via INTRAVENOUS
  Filled 2015-02-24 (×5): qty 2

## 2015-02-24 MED ORDER — HYDROCHLOROTHIAZIDE 12.5 MG PO CAPS
12.5000 mg | ORAL_CAPSULE | Freq: Every day | ORAL | Status: DC
  Administered 2015-02-24 – 2015-02-26 (×2): 12.5 mg via ORAL
  Filled 2015-02-24 (×3): qty 1

## 2015-02-24 MED ORDER — ACETAMINOPHEN 650 MG RE SUPP: 650.0000 mg | Freq: Four times a day (QID) | RECTAL | Status: DC | PRN

## 2015-02-24 MED ORDER — ENOXAPARIN SODIUM 40 MG/0.4ML ~~LOC~~ SOLN
40.0000 mg | SUBCUTANEOUS | Status: DC
  Administered 2015-02-25 – 2015-02-27 (×3): 40 mg via SUBCUTANEOUS
  Filled 2015-02-24 (×3): qty 0.4

## 2015-02-24 MED ORDER — VITAMIN D 1000 UNITS PO TABS
1000.0000 [IU] | ORAL_TABLET | Freq: Every day | ORAL | Status: DC
  Administered 2015-02-26 – 2015-02-27 (×2): 1000 [IU] via ORAL
  Filled 2015-02-24 (×3): qty 1

## 2015-02-24 MED ORDER — LISINOPRIL-HYDROCHLOROTHIAZIDE 20-12.5 MG PO TABS: 1.0000 | ORAL_TABLET | Freq: Every day | ORAL | Status: DC

## 2015-02-24 MED ORDER — POLYVINYL ALCOHOL 1.4 % OP SOLN
1.0000 [drp] | Freq: Two times a day (BID) | OPHTHALMIC | Status: DC
  Administered 2015-02-24 – 2015-02-26 (×5): 1 [drp] via OPHTHALMIC
  Filled 2015-02-24: qty 15

## 2015-02-24 MED ORDER — METOCLOPRAMIDE HCL 5 MG PO TABS
5.0000 mg | ORAL_TABLET | Freq: Three times a day (TID) | ORAL | Status: DC | PRN
  Filled 2015-02-24: qty 2

## 2015-02-24 MED ORDER — PSYLLIUM 95 % PO PACK
1.0000 | PACK | Freq: Every day | ORAL | Status: DC
  Administered 2015-02-24 – 2015-02-27 (×3): 1 via ORAL
  Filled 2015-02-24 (×4): qty 1

## 2015-02-24 MED ORDER — PROPOFOL INFUSION 10 MG/ML OPTIME
INTRAVENOUS | Status: DC | PRN
Start: 2015-02-24 — End: 2015-02-24
  Administered 2015-02-24: 70 ug/kg/min via INTRAVENOUS

## 2015-02-24 SURGICAL SUPPLY — 74 items
BANDAGE ELASTIC 6 CLIP ST LF (GAUZE/BANDAGES/DRESSINGS) ×3 IMPLANT
BIT DRILL QUICK REL 1/8 2PK SL (DRILL) ×1 IMPLANT
BLADE SURG SZ10 CARB STEEL (BLADE) ×12 IMPLANT
BNDG COHESIVE 4X5 TAN STRL (GAUZE/BANDAGES/DRESSINGS) ×3 IMPLANT
BNDG COHESIVE 6X5 TAN STRL LF (GAUZE/BANDAGES/DRESSINGS) ×3 IMPLANT
BNDG ESMARK 6X12 TAN STRL LF (GAUZE/BANDAGES/DRESSINGS) ×3 IMPLANT
BONE CEMENT PALACOSE (Orthopedic Implant) ×3 IMPLANT
BOWL CEMENT MIX W SPATULA BONE (MISCELLANEOUS) ×3 IMPLANT
CANISTER SUCT 1200ML W/VALVE (MISCELLANEOUS) ×3 IMPLANT
CANISTER SUCT 3000ML (MISCELLANEOUS) ×3 IMPLANT
CAPT KNEE PARTIAL 2 ×3 IMPLANT
CATH FOL LEG HOLDER (MISCELLANEOUS) ×3 IMPLANT
CATH TRAY METER 16FR LF (MISCELLANEOUS) ×3 IMPLANT
CEMENT BONE PALACOSE (Orthopedic Implant) ×1 IMPLANT
CHLORAPREP W/TINT 26ML (MISCELLANEOUS) ×6 IMPLANT
CNTNR SPEC C3OZ STD GRAD LEK (MISCELLANEOUS) IMPLANT
CONT SPEC 3OZ W/LID STRL (MISCELLANEOUS)
COOLER POLAR GLACIER W/PUMP (MISCELLANEOUS) ×3 IMPLANT
COVER MAYO STAND STRL (DRAPES) ×3 IMPLANT
DRAPE C-ARM XRAY 36X54 (DRAPES) ×3 IMPLANT
DRAPE INCISE IOBAN 66X45 STRL (DRAPES) ×3 IMPLANT
DRAPE TABLE BACK 80X90 (DRAPES) ×3 IMPLANT
DRILL QUICK RELEASE 1/8 INCH (DRILL) ×2
DRSG OPSITE POSTOP 4X12 (GAUZE/BANDAGES/DRESSINGS) IMPLANT
DRSG OPSITE POSTOP 4X14 (GAUZE/BANDAGES/DRESSINGS) IMPLANT
DRSG OPSITE POSTOP 4X6 (GAUZE/BANDAGES/DRESSINGS) ×3 IMPLANT
ELECT CAUTERY BLADE 6.4 (BLADE) ×3 IMPLANT
GAUZE PETRO XEROFOAM 1X8 (MISCELLANEOUS) IMPLANT
GAUZE SPONGE 4X4 12PLY STRL (GAUZE/BANDAGES/DRESSINGS) IMPLANT
GLOVE BIO SURGEON STRL SZ7.5 (GLOVE) ×6 IMPLANT
GLOVE BIO SURGEON STRL SZ8 (GLOVE) ×6 IMPLANT
GLOVE BIOGEL PI IND STRL 8 (GLOVE) ×1 IMPLANT
GLOVE BIOGEL PI INDICATOR 8 (GLOVE) ×2
GLOVE INDICATOR 8.0 STRL GRN (GLOVE) ×3 IMPLANT
GOWN STRL REUS W/ TWL LRG LVL3 (GOWN DISPOSABLE) ×2 IMPLANT
GOWN STRL REUS W/ TWL XL LVL3 (GOWN DISPOSABLE) ×1 IMPLANT
GOWN STRL REUS W/TWL LRG LVL3 (GOWN DISPOSABLE) ×4
GOWN STRL REUS W/TWL XL LVL3 (GOWN DISPOSABLE) ×2
GRADUATE 1200CC STRL 31836 (MISCELLANEOUS) ×3 IMPLANT
HANDLE YANKAUER SUCT BULB TIP (MISCELLANEOUS) ×3 IMPLANT
HANDPIECE SUCTION TUBG SURGILV (MISCELLANEOUS) ×3 IMPLANT
HOOD PEEL AWAY FACE SHEILD DIS (HOOD) ×9 IMPLANT
MAT BLUE FLOOR 46X72 FLO (MISCELLANEOUS) ×3 IMPLANT
NDL SAFETY 18GX1.5 (NEEDLE) ×3 IMPLANT
NEEDLE SPNL 18GX3.5 QUINCKE PK (NEEDLE) ×3 IMPLANT
NEEDLE SPNL 20GX3.5 QUINCKE YW (NEEDLE) ×3 IMPLANT
NS IRRIG 1000ML POUR BTL (IV SOLUTION) ×3 IMPLANT
PACK ARTHROSCOPY KNEE (MISCELLANEOUS) ×3 IMPLANT
PACK BLADE SAW RECIP 70 3 PT (BLADE) ×3 IMPLANT
PAD GROUND ADULT SPLIT (MISCELLANEOUS) ×3 IMPLANT
PAD WRAPON POLAR KNEE (MISCELLANEOUS) ×1 IMPLANT
PADDING CAST 4IN STRL (MISCELLANEOUS)
PADDING CAST BLEND 4X4 STRL (MISCELLANEOUS) IMPLANT
PENCIL ELECTRO HAND CTR (MISCELLANEOUS) ×3 IMPLANT
SOL .9 NS 3000ML IRR  AL (IV SOLUTION) ×2
SOL .9 NS 3000ML IRR UROMATIC (IV SOLUTION) ×1 IMPLANT
SPONGE LAP 18X18 5 PK (GAUZE/BANDAGES/DRESSINGS) ×6 IMPLANT
SPONGE XRAY 4X4 16PLY STRL (MISCELLANEOUS) ×3 IMPLANT
STAPLER SKIN PROX 35W (STAPLE) ×3 IMPLANT
STRAP SAFETY BODY (MISCELLANEOUS) ×3 IMPLANT
SUCTION FRAZIER TIP 10 FR DISP (SUCTIONS) ×3 IMPLANT
SUT VIC AB 0 CT1 36 (SUTURE) ×3 IMPLANT
SUT VIC AB 2-0 CT1 27 (SUTURE) ×4
SUT VIC AB 2-0 CT1 TAPERPNT 27 (SUTURE) ×2 IMPLANT
SUT VIC AB 2-0 CT2 27 (SUTURE) ×3 IMPLANT
SYR 20CC LL (SYRINGE) ×3 IMPLANT
SYR 30ML LL (SYRINGE) ×9 IMPLANT
SYR BULB IRRIG 60ML STRL (SYRINGE) ×3 IMPLANT
SYRINGE 10CC LL (SYRINGE) ×3 IMPLANT
TAPE TRANSPORE STRL 2 31045 (GAUZE/BANDAGES/DRESSINGS) IMPLANT
TUBING CONNECTING 10 (TUBING) ×2 IMPLANT
TUBING CONNECTING 10' (TUBING) ×1
WATER STERILE IRR 1000ML POUR (IV SOLUTION) IMPLANT
WRAPON POLAR PAD KNEE (MISCELLANEOUS) ×3

## 2015-02-24 NOTE — Evaluation (Signed)
Physical Therapy Evaluation Patient Details Name: [name] MRN: [id number] DOB: 1941/04/25 Today's Date: 02/24/2015   History of Present Illness  Pt is a 74 yo female who was admitted to the hospital s/p R partial knee replacement on 02/24/15  Clinical Impression  Pt presents with hx of A-fib, HTN, CKD, GERD, and arthritis. Examination reveals that pt performs bed mobility with min assist, transfers with CGA, and ambulation with CGA. Per Dr. Joice Lofts, pt is full weight bearing status, so mobility was performed as such. Pt also performed mobility on room air after she was found to be at 97% O2 sat at rest on room air. Following ambulation she was still at 97%, so was left off of the O2. Pt is a very pleasant person to work with and is motivated to participated in therapy. She has acute ROM, strength, and mobility deficits that will continue to benefit from skilled PT in order for her to safely return home. Pt was able to perform 10 AROM SLRs successfully and therefore no KI was donned for mobility.     Follow Up Recommendations Home health PT    Equipment Recommendations  None recommended by PT    Recommendations for Other Services       Precautions / Restrictions Precautions Precautions: Fall Restrictions Weight Bearing Restrictions: Yes RLE Weight Bearing: Weight bearing as tolerated      Mobility  Bed Mobility Overal bed mobility: Needs Assistance Bed Mobility: Supine to Sit     Supine to sit: Min assist     General bed mobility comments: Pt performs bed mobility with good strength and trunk control getting to EOB. She requires assist to guide leg to floor. Also needs cues on hand placement  Transfers Overall transfer level: Needs assistance Equipment used: Rolling walker (2 wheeled) Transfers: Sit to/from Stand Sit to Stand: Min guard         General transfer comment: Pt shows good strength in LLE and arms to get herself into standing with good confidence in her  movement. No LOB getting into standing  Ambulation/Gait Ambulation/Gait assistance: Min guard Ambulation Distance (Feet): 3 Feet Assistive device: Rolling walker (2 wheeled) Gait Pattern/deviations: Decreased step length - right;Decreased step length - left;Step-to pattern;Decreased stride length;Shuffle Gait velocity: decreased   General Gait Details: Pt ambulates with cues for sequencing of RW with gait. No buckling noted.    Stairs            Wheelchair Mobility    Modified Rankin (Stroke Patients Only)       Balance Overall balance assessment: No apparent balance deficits (not formally assessed)                                           Pertinent Vitals/Pain Pain Assessment: 0-10 Pain Score: 5  Pain Location: R hip (not operative site) Pain Descriptors / Indicators: Constant Pain Intervention(s): Limited activity within patient's tolerance;Monitored during session;Premedicated before session;Ice applied    Home Living Family/patient expects to be discharged to:: Private residence Living Arrangements: Alone Available Help at Discharge: Family;Available 24 hours/day Type of Home: House Home Access: Stairs to enter Entrance Stairs-Rails: Can reach both Entrance Stairs-Number of Steps: 2 Home Layout: One level   Additional Comments: Pt lives alone but daughter will be providing assistance after discharge     Prior Function Level of Independence: Independent  Comments: Pt was ambulating and very active in the community.      Hand Dominance        Extremity/Trunk Assessment   Upper Extremity Assessment: Overall WFL for tasks assessed           Lower Extremity Assessment: Overall WFL for tasks assessed;RLE deficits/detail RLE Deficits / Details: Pt gross MMT at least 3/5 in RLE.       Communication   Communication: No difficulties  Cognition Arousal/Alertness: Awake/alert Behavior During Therapy: WFL for tasks  assessed/performed Overall Cognitive Status: Within Functional Limits for tasks assessed                      General Comments      Exercises Total Joint Exercises Goniometric ROM: R knee AAROM: 5 - 86 degrees Other Exercises Other Exercises: Pt performed therex on RLE x 10 reps with supervision for proper technique. Exercises included: ankle pumps, quad sets, glute sets, SLR, hip abd/add      Assessment/Plan    PT Assessment Patient needs continued PT services  PT Diagnosis Difficulty walking;Abnormality of gait;Acute pain   PT Problem List Decreased strength;Decreased range of motion;Decreased activity tolerance;Decreased knowledge of use of DME;Decreased safety awareness;Decreased knowledge of precautions;Decreased mobility  PT Treatment Interventions DME instruction;Gait training;Stair training;Functional mobility training;Therapeutic activities;Therapeutic exercise;Balance training;Neuromuscular re-education;Cognitive remediation;Patient/family education;Manual techniques   PT Goals (Current goals can be found in the Care Plan section) Acute Rehab PT Goals Patient Stated Goal: to return home PT Goal Formulation: With patient Time For Goal Achievement: 03/10/15 Potential to Achieve Goals: Good    Frequency BID   Barriers to discharge        Co-evaluation               End of Session Equipment Utilized During Treatment: Gait belt Activity Tolerance: Patient tolerated treatment well Patient left: in chair;with call bell/phone within reach;with chair alarm set;with SCD's reapplied;with nursing/sitter in room Nurse Communication: Mobility status         Time: 1440-1505 PT Time Calculation (min) (ACUTE ONLY): 25 min   Charges:   PT Evaluation $Initial PT Evaluation Tier I: 1 Procedure PT Treatments $Therapeutic Exercise: 8-22 mins   PT G CodesBenna Dunks Mar 09, 2015, 4:58 PM  Benna Dunks, SPT. [phone]

## 2015-02-24 NOTE — Progress Notes (Signed)
To OR with thermal cap, thigh high stocking to non-op/left leg.  Sacral dressing sent to OR with patient if needed.

## 2015-02-24 NOTE — Anesthesia Preprocedure Evaluation (Addendum)
Anesthesia Evaluation  Patient identified by MRN, date of birth, ID band Patient awake    Reviewed: Allergy & Precautions, H&P , NPO status , Patient's Chart, lab work & pertinent test results, reviewed documented beta blocker date and time   History of Anesthesia Complications Negative for: history of anesthetic complications  Airway Mallampati: II  TM Distance: >3 FB Neck ROM: full    Dental no notable dental hx. (+) Poor Dentition, Edentulous Upper, Upper Dentures, Partial Lower   Pulmonary neg pulmonary ROS, former smoker,  breath sounds clear to auscultation  Pulmonary exam normal       Cardiovascular Exercise Tolerance: Good hypertension, - angina- CAD, - Past MI, - Cardiac Stents and - CABG Normal cardiovascular exam+ dysrhythmias (one episode) Atrial Fibrillation - Valvular Problems/MurmursRhythm:regular Rate:Normal     Neuro/Psych negative neurological ROS  negative psych ROS   GI/Hepatic Neg liver ROS, GERD-  Medicated and Controlled,  Endo/Other  negative endocrine ROS  Renal/GU CRFRenal disease  negative genitourinary   Musculoskeletal   Abdominal   Peds  Hematology negative hematology ROS (+)   Anesthesia Other Findings Past Medical History:   Atrial fibrillation                                          Hypertension                                                 Chronic kidney disease                                         Comment:UTI   GERD (gastroesophageal reflux disease)                       Arthritis                                                    Reproductive/Obstetrics negative OB ROS                            Anesthesia Physical Anesthesia Plan  ASA: II  Anesthesia Plan: Spinal   Post-op Pain Management:    Induction:   Airway Management Planned:   Additional Equipment:   Intra-op Plan:   Post-operative Plan:   Informed Consent: I have reviewed  the patients History and Physical, chart, labs and discussed the procedure including the risks, benefits and alternatives for the proposed anesthesia with the patient or authorized representative who has indicated his/her understanding and acceptance.   Dental Advisory Given  Plan Discussed with: Anesthesiologist, CRNA and Surgeon  Anesthesia Plan Comments:        Anesthesia Quick Evaluation

## 2015-02-24 NOTE — Evaluation (Signed)
Physical Therapy Evaluation Patient Details Name: [name] MRN: [id number] DOB: 10/18/1940 Today's Date: 02/24/2015   History of Present Illness  Pt is a 74 yo female who was admitted to the hospital s/p R partial knee replacement on 02/24/15  Clinical Impression  Pt presents with hx of A-fib, HTN, CKD, GERD, and arthritis. Examination reveals that pt performs bed mobility with min assist, transfers with CGA, and ambulation with CGA. Per Dr. Joice Lofts, pt is full weight bearing status, so mobility was performed as such. Pt also performed mobility on room air after she was found to be at 97% O2 sat at rest on room air. Following ambulation she was still at 97%, so was left off of the O2. Pt is a very pleasant person to work with and is motivated to participated in therapy. She has acute ROM, strength, and mobility deficits that will continue to benefit from skilled PT in order for her to safely return home.     Follow Up Recommendations Home health PT    Equipment Recommendations  None recommended by PT    Recommendations for Other Services       Precautions / Restrictions Precautions Precautions: Fall Restrictions Weight Bearing Restrictions: Yes RLE Weight Bearing: Weight bearing as tolerated      Mobility  Bed Mobility Overal bed mobility: Needs Assistance Bed Mobility: Supine to Sit     Supine to sit: Min assist     General bed mobility comments: Pt performs bed mobility with good strength and trunk control getting to EOB. She requires assist to guide leg to floor. Also needs cues on hand placement  Transfers Overall transfer level: Needs assistance Equipment used: Rolling walker (2 wheeled) Transfers: Sit to/from Stand Sit to Stand: Min guard         General transfer comment: Pt shows good strength in LLE and arms to get herself into standing with good confidence in her movement. No LOB getting into standing  Ambulation/Gait Ambulation/Gait assistance: Min  guard Ambulation Distance (Feet): 3 Feet Assistive device: Rolling walker (2 wheeled) Gait Pattern/deviations: Decreased step length - right;Decreased step length - left;Step-to pattern;Decreased stride length;Shuffle Gait velocity: decreased   General Gait Details: Pt ambulates with cues for sequencing of RW with gait. No buckling noted.    Stairs            Wheelchair Mobility    Modified Rankin (Stroke Patients Only)       Balance Overall balance assessment: No apparent balance deficits (not formally assessed)                                           Pertinent Vitals/Pain Pain Assessment: 0-10 Pain Score: 5  Pain Location: R hip (not operative site) Pain Descriptors / Indicators: Constant Pain Intervention(s): Limited activity within patient's tolerance;Monitored during session;Premedicated before session;Ice applied    Home Living Family/patient expects to be discharged to:: Private residence Living Arrangements: Alone Available Help at Discharge: Family;Available 24 hours/day Type of Home: House Home Access: Stairs to enter Entrance Stairs-Rails: Can reach both Entrance Stairs-Number of Steps: 2 Home Layout: One level   Additional Comments: Pt lives alone but daughter will be providing assistance after discharge     Prior Function Level of Independence: Independent         Comments: Pt was ambulating and very active in the community.  Hand Dominance        Extremity/Trunk Assessment   Upper Extremity Assessment: Overall WFL for tasks assessed           Lower Extremity Assessment: Overall WFL for tasks assessed;RLE deficits/detail RLE Deficits / Details: Pt gross MMT at least 3/5 in RLE.       Communication   Communication: No difficulties  Cognition Arousal/Alertness: Awake/alert Behavior During Therapy: WFL for tasks assessed/performed Overall Cognitive Status: Within Functional Limits for tasks assessed                       General Comments      Exercises Total Joint Exercises Goniometric ROM: R knee AAROM: 5 - 86 degrees Other Exercises Other Exercises: Pt performed therex on RLE x 10 reps with supervision for proper technique. Exercises included: ankle pumps, quad sets, glute sets, SLR, hip abd/add      Assessment/Plan    PT Assessment Patient needs continued PT services  PT Diagnosis Difficulty walking;Abnormality of gait;Acute pain   PT Problem List Decreased strength;Decreased range of motion;Decreased activity tolerance;Decreased knowledge of use of DME;Decreased safety awareness;Decreased knowledge of precautions;Decreased mobility  PT Treatment Interventions DME instruction;Gait training;Stair training;Functional mobility training;Therapeutic activities;Therapeutic exercise;Balance training;Neuromuscular re-education;Cognitive remediation;Patient/family education;Manual techniques   PT Goals (Current goals can be found in the Care Plan section) Acute Rehab PT Goals Patient Stated Goal: to return home PT Goal Formulation: With patient Time For Goal Achievement: 03/10/15 Potential to Achieve Goals: Good    Frequency BID   Barriers to discharge        Co-evaluation               End of Session Equipment Utilized During Treatment: Gait belt Activity Tolerance: Patient tolerated treatment well Patient left: in chair;with call bell/phone within reach;with chair alarm set;with SCD's reapplied;with nursing/sitter in room Nurse Communication: Mobility status         Time: 1440-1505 PT Time Calculation (min) (ACUTE ONLY): 25 min   Charges:         PT G CodesBenna Dunks March 22, 2015, 4:40 PM  Benna Dunks, SPT. [phone]

## 2015-02-24 NOTE — Anesthesia Procedure Notes (Signed)
Date/Time: 02/24/2015 7:35 AM Performed by: Derinda Late Pre-anesthesia Checklist: Patient identified, Emergency Drugs available, Suction available, Patient being monitored and Timeout performed Patient Re-evaluated:Patient Re-evaluated prior to inductionOxygen Delivery Method: Simple face mask Preoxygenation: Pre-oxygenation with 100% oxygen

## 2015-02-24 NOTE — Op Note (Signed)
02/24/2015  9:55 AM  Patient:   [name]  Pre-Op Diagnosis:   Osteoarthritis of medial compartment, right knee.  Post-Op Diagnosis:   Same  Procedure:   Right unicondylar knee arthroplasty.  Surgeon:   Maryagnes Amos, MD  Assistant:   Horris Latino, PA-C  Anesthesia:   Spinal  Findings:   As above.  Complications:   None  EBL:   10 cc  Fluids:   1000 cc crystalloid  UOP:   100 cc  TT:   90 minutes at 300 mmHg  Drains:   None  Closure:   Staples  Implants:   All-cemented Biomet Oxford system with a Small femoral component, a "C" sized tibial tray, and a 5 mm meniscal bearing insert.  Brief Clinical Note:   The patient is a 74 year old female with a 6+ month history of progressively worsening medial sided right knee pain. Her symptoms have progressed despite medications, activity modification, etc. Her history and examination were consistent with significant degenerative joint disease confirmed by plain radiographs. An MRI scan demonstrated excellent preservation of the lateral and patellofemoral compartments. She presents at this time for a right partial knee replacement.  Procedure:   The patient was brought into the operating room and a spinal placed by the anesthesiologist. She was lain in the supine position and a Foley catheter inserted. The patient was repositioned so that the non-surgical leg was placed in a flexed and abducted position in the yellow fin leg holder while the surgical site was placed over the Biomet leg holder. The right lower extremity was prepped with ChloraPrep solution before being draped sterilely. Preoperative antibiotics were administered. After performing a timeout to verify the appropriate surgical site, the limb was exsanguinated with an Esmarch and the tourniquet inflated to 300 mmHg. A standard anterior approach to the knee was made through an approximately 3.5-4 inch incision. The incision was carried down through the subcutaneous  tissues to expose the superficial retinaculum. This was split the length the incision and medial flap elevated sufficiently to expose the medial retinaculum. This was incised along the medial border of the patella tendon and extended proximally along the medial border of the patella, leaving a 3-4 mm cuff of tissue. The soft tissues were elevated off the anteromedial aspect of the proximal tibia. The anterior portion of the meniscus was removed after performing a subtotal excision of the infrapatellar fat pad. The anterior cruciate ligament was inspected and found to be in excellent condition. Osteophytes were removed from the inferior pole of the patella as well as from the notch using a quarter-inch osteotome. There were significant degenerative changes of both the femur and tibia on the medial side. The medial femoral condyle was sized using the small and medium sizers. It was felt that the small guide best optimized the contour of the femur. This was left in place and the external tibial guide positioned. The coupling device was used to connect the guide to the medial femoral condylar sizer to optimize appropriate orientation. Two guide pins were inserted into the cutting block before the coupling device and sizer were removed. The appropriate tibial cut was made using the oscillating and reciprocating saws. The piece was removed in its entirety and taken to the back table where it was sized and found to be optimally replicated by a "C" sized component. The 9 mm spacer was inserted to verify that sufficient bone had been removed.  Attention was directed to femoral side. Intramedullary canal was accessed  through a 4 mm drill hole. The intramedullary guide was positioned before the guide for the femoral condylar holes was positioned. The appropriate coupling device connected this in to the intramedullary guide before both drill holes were placed into the distal aspect of the medial femoral condyle. The devices  were removed and the posterior condylar cutting block inserted. The appropriate cut was made using the reciprocating saw and this piece removed. The #0 spigot was inserted and the initial bone milling performed. A trial femoral component was inserted and both the flexion and extension gaps measured. In flexion, the gap measured 8 mm whereas in extension, it measured 6 mm. Therefore, the #2 spigot was selected and the secondary bone milling performed. Repeat sizing demonstrated symmetric flexion and extension gaps. The bone was removed from the postero-medial and postero-lateral aspects of the femoral condyle, as well as from the beneath the collar of the spigot. Bone also was removed from the anterior portion of the femur so as to minimize any potential impingement with the meniscal bearing insert. The trial components removed and several drill holes placed into the distal femoral condyle to further augment cement fixation.  Attention was redirected to the tibial side. The "C" sized tibial tray was positioned and temporarily secured using the appropriate spiked nail. The keel was created using the bi-bladed reciprocating saw and hoe. The keeled "C" sized trial tibial tray was inserted to be sure that it seated properly. At this point, a total of 20 cc of Exparel diluted out to 60 cc with normal saline and 30 cc of half percent Sensorcaine was injected in and around the posterior and medial capsular tissues, as well as the peri-incisional tissues to help with postoperative pain control.  The bony surfaces were prepared for cementing by irrigating them thoroughly with bacitracin saline solution using the jet lavage system before packing them with a dry Ray-Tec sponge. Meanwhile, cement was being mixed on the back table. When the cement was ready, the tibial tray was cemented in first. The excess cement was removed using a Public house manager after impacting it into place. Next, the femoral component was impacted into  place. Again the excess cement was removed using a Public house manager. The 6 mm spacer was inserted and the knee brought into near full extension while the cement hardened. Once the cement hardened, the spacer was removed and the 5 mm meniscal bearing insert trial. This demonstrated excellent tracking while the knee was placed through a range of motion, and showed no evidence towards subluxation or dislocation. Therefore, the permanent 5 mm meniscal bearing insert was snapped into position after verifying that no cement in the retained posteriorly. Again the knee was placed through a range of motion with the findings as described above.  The wound was copiously irrigated with bacitracin saline solution via the jet lavage system before the retinacular layer was reapproximated using #0 Vicryl interrupted sutures. At this point, 1 g of transexemic acid in 10 cc of normal saline was injected intra-articularly. The subcutaneous tissues were closed in two layers using 2-0 Vicryl interrupted sutures before the skin was closed using staples. A sterile occlusive dressing was applied to the knee before the patient was awakened. She was transferred back to her hospital bed and returned to the recovery room in satisfactory condition after tolerating the procedure well. A Polar Care device was applied to the knee as well.

## 2015-02-24 NOTE — Care Management (Signed)
Patient received from PACU. List of home health agencies left with patient. RNCM will follow up with patient tomorrow.  

## 2015-02-24 NOTE — H&P (Signed)
Paper H&P to be scanned into permanent record. H&P reviewed. No changes. 

## 2015-02-24 NOTE — Transfer of Care (Signed)
Immediate Anesthesia Transfer of Care Note  Patient: [name]  Procedure(s) Performed: Procedure(s): UNICOMPARTMENTAL KNEE (Right)  Patient Location: PACU  Anesthesia Type:Spinal  Level of Consciousness: awake, alert  and oriented  Airway & Oxygen Therapy: Patient Spontanous Breathing and Patient connected to face mask oxygen  Post-op Assessment: Report given to RN and Post -op Vital signs reviewed and stable  Post vital signs: Reviewed and stable  Last Vitals:  Filed Vitals:   02/24/15 1015  BP:   Pulse:   Temp: 36.3 C  Resp:     Complications: No apparent anesthesia complications

## 2015-02-25 LAB — CBC WITH DIFFERENTIAL/PLATELET
Basophils Absolute: 0 10*3/uL (ref 0–0.1)
Basophils Relative: 0 %
EOS ABS: 0 10*3/uL (ref 0–0.7)
EOS PCT: 1 %
HCT: 32.9 % — ABNORMAL LOW (ref 35.0–47.0)
HEMOGLOBIN: 11.5 g/dL — AB (ref 12.0–16.0)
LYMPHS ABS: 0.9 10*3/uL — AB (ref 1.0–3.6)
LYMPHS PCT: 15 %
MCH: 28.9 pg (ref 26.0–34.0)
MCHC: 35 g/dL (ref 32.0–36.0)
MCV: 82.7 fL (ref 80.0–100.0)
MONOS PCT: 12 %
Monocytes Absolute: 0.7 10*3/uL (ref 0.2–0.9)
Neutro Abs: 4.4 10*3/uL (ref 1.4–6.5)
Neutrophils Relative %: 72 %
Platelets: 151 10*3/uL (ref 150–440)
RBC: 3.98 MIL/uL (ref 3.80–5.20)
RDW: 14.1 % (ref 11.5–14.5)
WBC: 6.1 10*3/uL (ref 3.6–11.0)

## 2015-02-25 LAB — BASIC METABOLIC PANEL
Anion gap: 4 — ABNORMAL LOW (ref 5–15)
BUN: 8 mg/dL (ref 6–20)
CHLORIDE: 106 mmol/L (ref 101–111)
CO2: 29 mmol/L (ref 22–32)
CREATININE: 0.8 mg/dL (ref 0.44–1.00)
Calcium: 8.6 mg/dL — ABNORMAL LOW (ref 8.9–10.3)
GFR calc Af Amer: >60 mL/min (ref >60)
GFR calc non Af Amer: >60 mL/min (ref >60)
GLUCOSE: 118 mg/dL — AB (ref 65–99)
POTASSIUM: 3.8 mmol/L (ref 3.5–5.1)
SODIUM: 139 mmol/L (ref 135–145)

## 2015-02-25 MED ORDER — TAPENTADOL HCL 50 MG PO TABS
50.0000 mg | ORAL_TABLET | ORAL | Status: DC | PRN
  Administered 2015-02-25 (×3): 50 mg via ORAL
  Filled 2015-02-25 (×3): qty 1

## 2015-02-25 MED ORDER — TRAMADOL HCL 50 MG PO TABS
50.0000 mg | ORAL_TABLET | Freq: Four times a day (QID) | ORAL | Status: DC | PRN
  Administered 2015-02-27: 100 mg via ORAL
  Filled 2015-02-25: qty 2

## 2015-02-25 NOTE — Progress Notes (Signed)
Referred to CSW for ?SNF. Chart reviewed and patient discussed in unit rounds with RNCM and RN who indicate patient plans to d/c home with [facility] and DME. CSW to sign off- please contact us if SW needs arise. Reece Levy, MSW, Theresia Majors [phone]

## 2015-02-25 NOTE — Progress Notes (Signed)
  Subjective: 1 Day Post-Op Procedure(s) (LRB): UNICOMPARTMENTAL KNEE (Right) Patient reports pain as 8 on 0-10 scale.   Patient is well, but has had some minor complaints of reflux symptoms Plan is to go home with homehealth PT after hospital stay. Negative for chest pain and shortness of breath Fever: no Gastrointestinal:Positive for nausea and vomiting last night.  She is not nauseated this AM.  Objective: Vital signs in last 24 hours: Temp:  [96.5 F (35.8 C)-98.4 F (36.9 C)] 98.1 F (36.7 C) (09/07 0404) Pulse Rate:  [43-90] 67 (09/07 0404) Resp:  [16-22] 22 (09/07 0404) BP: (105-149)/(48-67) 120/55 mmHg (09/07 0404) SpO2:  [94 %-100 %] 99 % (09/07 0404) FiO2 (%):  [28 %] 28 % (09/06 1118)  Intake/Output from previous day:  Intake/Output Summary (Last 24 hours) at 02/25/15 0808 Last data filed at 02/25/15 0600  Gross per 24 hour  Intake   3375 ml  Output   3235 ml  Net    140 ml    Intake/Output this shift:    Labs:  Recent Labs  02/25/15 0431  HGB 11.5*    Recent Labs  02/25/15 0431  WBC 6.1  RBC 3.98  HCT 32.9*  PLT 151    Recent Labs  02/25/15 0431  NA 139  K 3.8  CL 106  CO2 29  BUN 8  CREATININE 0.80  GLUCOSE 118*  CALCIUM 8.6*   No results for input(s): LABPT, INR in the last 72 hours.   EXAM General - Patient is Alert, Appropriate and Oriented Extremity - Neurologically intact Sensation intact distally Intact pulses distally Dorsiflexion/Plantar flexion intact Incision: dressing C/D/I No cellulitis present Dressing/Incision - clean, dry, no drainage Motor Function - intact, moving foot and toes well on exam.   Abdomen is slightly distended with tympany this AM.  Normal BS.  Past Medical History  Diagnosis Date  . Atrial fibrillation   . Hypertension   . Chronic kidney disease     UTI  . GERD (gastroesophageal reflux disease)   . Arthritis     Assessment/Plan: 1 Day Post-Op Procedure(s) (LRB): UNICOMPARTMENTAL KNEE  (Right) Active Problems:   Status post right partial knee replacement  Estimated body mass index is 29.51 kg/(m^2) as calculated from the following:   Height as of this encounter:  (1.626 m).   Weight as of this encounter: 78.019 kg (172 lb). Advance diet Up with therapy D/C IV fluids   Foley removed this AM.  Pt will need to urinate before discharge. Abdomen distended with tympany.  Dr. Joice Lofts spoke with her about using suppository or enema for BM.  If she develops abdominal pain and continues to vomit, will consider KUB for post-op ileus. D/C IVF when patient tolerating PO intake. Labs reviewed.  CBC and BMP ordered for tomorrow. Plan will be for discharge tomorrow.  DVT Prophylaxis - Lovenox, Foot Pumps and TED hose Weight-Bearing as tolerated to right leg  J. Horris Latino, PA-C [facility] Orthopaedic Surgery 02/25/2015, 8:08 AM

## 2015-02-25 NOTE — Progress Notes (Deleted)
Physical Therapy Treatment Patient Details Name: [name] MRN: [id number] DOB: 1941-06-20 Today's Date: 02/25/2015    History of Present Illness Pt is a 74 yo female who was admitted to the hospital s/p R partial knee replacement on 02/24/15    PT Comments    Pt limited by pain this afternoon and is unable to perform mobility with therapy. Pt states that she wants to, but is in too much pain. She was agreeable to bed exercise though. She understands that walking will be a priority for tomorrow, and she is prepared for that. Nurse notified of pain. Due to pain, ROM, and mobility deficits, pt will continue to benefit from skilled PT in order for her to return home safely.   Follow Up Recommendations  Home health PT     Equipment Recommendations  None recommended by PT    Recommendations for Other Services       Precautions / Restrictions Precautions Precautions: Fall Restrictions Weight Bearing Restrictions: Yes RLE Weight Bearing: Weight bearing as tolerated    Mobility  Bed Mobility Overal bed mobility:  (Not assessed secondary to pain) Bed Mobility: Supine to Sit     Supine to sit: Min assist     General bed mobility comments: Pt performs bed mobility with good strength and trunk control getting to EOB. She requires assist to guide leg to floor. Also needs cues on hand placement (Mobility causes nausea today)  Transfers Overall transfer level:  (Not assessed secondary to pain) Equipment used: Rolling walker (2 wheeled) Transfers: Sit to/from Stand Sit to Stand: Min guard         General transfer comment: Pt shows good strength in LLE and arms to get herself into standing with good confidence in her movement. No LOB getting into standing  Ambulation/Gait Ambulation/Gait assistance:  (Not assessed secondary to pain) Ambulation Distance (Feet): 5 Feet Assistive device: Rolling walker (2 wheeled) Gait Pattern/deviations: Step-to pattern;Decreased step length  - right;Decreased step length - left;Decreased stride length;Shuffle;Antalgic Gait velocity: decreased   General Gait Details: Pt ambulates with cues for sequencing of RW with gait. No buckling noted.   (Pt limited this AM secondary to nausea)   Stairs            Wheelchair Mobility    Modified Rankin (Stroke Patients Only)       Balance Overall balance assessment: No apparent balance deficits (not formally assessed)                                  Cognition Arousal/Alertness: Awake/alert Behavior During Therapy: WFL for tasks assessed/performed Overall Cognitive Status: Within Functional Limits for tasks assessed                      Exercises Total Joint Exercises Goniometric ROM: R knee AAROM: 5 - 91 degrees Other Exercises Other Exercises: Pt performed therex on RLE x 10 reps with supervision for proper technique. Exercises included: ankle pumps, quad sets, glute sets, SLR, hip abd/add (Limited to bed exercise this afternoon due to pain)    General Comments        Pertinent Vitals/Pain Pain Assessment: 0-10 Pain Score: 9  Pain Location: R knee, R hip, lower back Pain Descriptors / Indicators: Constant Pain Intervention(s): Limited activity within patient's tolerance;Monitored during session;Premedicated before session;Ice applied    Home Living  Prior Function            PT Goals (current goals can now be found in the care plan section) Acute Rehab PT Goals Patient Stated Goal: to stay in bed PT Goal Formulation: With patient Time For Goal Achievement: 03/10/15 Potential to Achieve Goals: Good    Frequency  BID    PT Plan Current plan remains appropriate    Co-evaluation             End of Session Equipment Utilized During Treatment: Gait belt Activity Tolerance: Patient limited by pain Patient left: in bed;with bed alarm set;with call bell/phone within reach;with family/visitor  present;with SCD's reapplied     Time: 1455-1505 PT Time Calculation (min) (ACUTE ONLY): 10 min  Charges:  $Gait Training: 8-22 mins $Therapeutic Exercise: 8-22 mins                    G CodesBenna Dunks 20-Mar-2015, 4:56 PM  Benna Dunks, SPT. [phone]

## 2015-02-25 NOTE — Care Management Note (Addendum)
Case Management Note  Patient Details  Name: [name] MRN: [id number] Date of Birth: 02-18-1941  Subjective/Objective:                   Met with patient to discuss discharge planning. She would like to return home at discharge. She is limited at this time with information due to feeling sick and "like she is going to throw up". RN is aware and has treated symptoms. She has a rolling walker and a 4-pronged cane. She states she uses [facility] : [phone] for Rx. She is undecided on home health agency and wishes that I return.   Action/Plan: List of home health agencies provided. RNCM will continue to follow. Lovenox 56m #14 called in to pharmacy/[facility].   Expected Discharge Date:                  Expected Discharge Plan:     In-House Referral:     Discharge planning Services  CM Consult  Post Acute Care Choice:  Home Health, Durable Medical Equipment Choice offered to:  Patient  DME Arranged:    DME Agency:     HH Arranged:  PT HH Agency:     Status of Service:  In process, will continue to follow  Medicare Important Message Given:    Date Medicare IM Given:    Medicare IM give by:    Date Additional Medicare IM Given:    Additional Medicare Important Message give by:     If discussed at [place] Meetings, dates discussed:    Additional Comments: [facility] to Tim/Jerry with [facility] Lovenox $127.69 Nucynta will probably be more expensive. I will share this with patient.   AMarshell Garfinkel RN 02/25/2015, 9:31 AM

## 2015-02-25 NOTE — Progress Notes (Signed)
MD Poggi notified about pts nausea, small emesis and pain, also that pt requested to not take her morning meds due to her nausea. See [facility] for new orders that MD ordered. Will continue to monitor.

## 2015-02-25 NOTE — Progress Notes (Signed)
Physical Therapy Treatment Patient Details Name: [name] MRN: [id number] DOB: 1941-05-27 Today's Date: 02/25/2015    History of Present Illness Pt is a 74 yo female who was admitted to the hospital s/p R partial knee replacement on 02/24/15    PT Comments    Pt very nauseous this morning, which limited her ability to perform mobility tasks. Pt was still willing to participate despite this and was able to perform therex and minor ambulation. Pt did not vomit. Pt also complaining of greater pain with ambulation. Nurse notified of nausea and pain. Due to range of motion, strength, and gait impairments, pt will benefit from skilled PT in order for her to return home safely.    Follow Up Recommendations  Home health PT     Equipment Recommendations  None recommended by PT    Recommendations for Other Services       Precautions / Restrictions Precautions Precautions: Fall Restrictions Weight Bearing Restrictions: Yes RLE Weight Bearing: Weight bearing as tolerated    Mobility  Bed Mobility Overal bed mobility: Needs Assistance Bed Mobility: Supine to Sit     Supine to sit: Min assist     General bed mobility comments: Pt performs bed mobility with good strength and trunk control getting to EOB. She requires assist to guide leg to floor. Also needs cues on hand placement (Mobility causes nausea today)  Transfers Overall transfer level: Needs assistance Equipment used: Rolling walker (2 wheeled) Transfers: Sit to/from Stand Sit to Stand: Min guard         General transfer comment: Pt shows good strength in LLE and arms to get herself into standing with good confidence in her movement. No LOB getting into standing  Ambulation/Gait Ambulation/Gait assistance: Min guard Ambulation Distance (Feet): 5 Feet Assistive device: Rolling walker (2 wheeled) Gait Pattern/deviations: Step-to pattern;Decreased step length - right;Decreased step length - left;Decreased stride  length;Shuffle;Antalgic Gait velocity: decreased   General Gait Details: Pt ambulates with cues for sequencing of RW with gait. No buckling noted.   (Pt limited this AM secondary to nausea)   Stairs            Wheelchair Mobility    Modified Rankin (Stroke Patients Only)       Balance Overall balance assessment: No apparent balance deficits (not formally assessed)                                  Cognition Arousal/Alertness: Awake/alert Behavior During Therapy: WFL for tasks assessed/performed Overall Cognitive Status: Within Functional Limits for tasks assessed                      Exercises Total Joint Exercises Goniometric ROM: R knee AAROM: 5 - 91 degrees Other Exercises Other Exercises: Pt performed therex on RLE x 10 reps with supervision for proper technique. Exercises included: ankle pumps, quad sets, glute sets, SLR, hip abd/add    General Comments        Pertinent Vitals/Pain Pain Assessment: 0-10 Pain Score: 10-Worst pain ever Pain Location: R knee Pain Descriptors / Indicators: Constant Pain Intervention(s): Limited activity within patient's tolerance;Monitored during session;Utilized relaxation techniques;Ice applied    Home Living                      Prior Function            PT Goals (current goals can now  be found in the care plan section) Acute Rehab PT Goals Patient Stated Goal: to return home PT Goal Formulation: With patient Time For Goal Achievement: 03/10/15 Potential to Achieve Goals: Good    Frequency  BID    PT Plan Current plan remains appropriate    Co-evaluation             End of Session Equipment Utilized During Treatment: Gait belt Activity Tolerance: Patient tolerated treatment well Patient left: in chair;with call bell/phone within reach;with chair alarm set;with SCD's reapplied;with nursing/sitter in room     Time: 1610-9604 PT Time Calculation (min) (ACUTE ONLY): 24  min  Charges:                       G CodesBenna Dunks 2015-03-16, 1:16 PM  Benna Dunks, SPT. [phone]

## 2015-02-25 NOTE — Progress Notes (Signed)
Physical Therapy Treatment Patient Details Name: [name] MRN: [id number] DOB: Nov 12, 1940 Today's Date: 02/25/2015    History of Present Illness Pt is a 74 yo female who was admitted to the hospital s/p R partial knee replacement on 02/24/15    PT Comments    Pt limited by pain this afternoon and is unable to perform mobility with therapy. Pt states that she wants to, but is in too much pain. She was agreeable to bed exercise though. She understands that walking will be a priority for tomorrow, and she is prepared for that. Nurse notified of pain. Due to pain, ROM, and mobility deficits, pt will continue to benefit from skilled PT in order for her to return home safely.   Follow Up Recommendations  Home health PT     Equipment Recommendations  None recommended by PT    Recommendations for Other Services       Precautions / Restrictions Precautions Precautions: Fall Restrictions Weight Bearing Restrictions: Yes RLE Weight Bearing: Weight bearing as tolerated    Mobility  Bed Mobility Overal bed mobility:  (Not assessed secondary to pain)                Transfers Overall transfer level:  (Not assessed secondary to pain)                  Ambulation/Gait Ambulation/Gait assistance:  (Not assessed secondary to pain)               Stairs            Wheelchair Mobility    Modified Rankin (Stroke Patients Only)       Balance Overall balance assessment: No apparent balance deficits (not formally assessed)                                  Cognition Arousal/Alertness: Awake/alert Behavior During Therapy: WFL for tasks assessed/performed Overall Cognitive Status: Within Functional Limits for tasks assessed                      Exercises Other Exercises Other Exercises: Pt performed therex on RLE x 10 reps with supervision for proper technique. Exercises included: ankle pumps, quad sets, glute sets, SLR, hip  abd/add (Limited to bed exercise this afternoon due to pain)    General Comments        Pertinent Vitals/Pain Pain Assessment: 0-10 Pain Score: 9  Pain Location: R knee, R hip, lower back Pain Descriptors / Indicators: Constant Pain Intervention(s): Limited activity within patient's tolerance;Monitored during session;Premedicated before session;Ice applied    Home Living                      Prior Function            PT Goals (current goals can now be found in the care plan section) Acute Rehab PT Goals Patient Stated Goal: to stay in bed PT Goal Formulation: With patient Time For Goal Achievement: 03/10/15 Potential to Achieve Goals: Good    Frequency  BID    PT Plan Current plan remains appropriate    Co-evaluation             End of Session Equipment Utilized During Treatment: Gait belt Activity Tolerance: Patient limited by pain Patient left: in bed;with bed alarm set;with call bell/phone within reach;with family/visitor present;with SCD's reapplied     Time: 4098-1191  PT Time Calculation (min) (ACUTE ONLY): 10 min  Charges:  $Therapeutic Exercise: 8-22 mins                    G CodesBenna Dunks 03/25/2015, 5:16 PM  Benna Dunks, SPT. [phone]

## 2015-02-25 NOTE — Anesthesia Postprocedure Evaluation (Signed)
  Anesthesia Post-op Note  Patient: [name]  Procedure(s) Performed: Procedure(s): UNICOMPARTMENTAL KNEE (Right)  Anesthesia type:Spinal  Patient location: Floor  Post pain: Pain level controlled  Post assessment: Post-op Vital signs reviewed, Patient's Cardiovascular Status Stable, Respiratory Function Stable, Patent Airway and No signs of Nausea or vomiting  Post vital signs: Reviewed and stable  Last Vitals:  Filed Vitals:   02/25/15 0404  BP: 120/55  Pulse: 67  Temp: 36.7 C  Resp: 22    Level of consciousness: awake, alert  and patient cooperative  Complications: No apparent anesthesia complications

## 2015-02-26 ENCOUNTER — Inpatient Hospital Stay: Payer: Medicare Other

## 2015-02-26 DIAGNOSIS — M179 Osteoarthritis of knee, unspecified: Secondary | ICD-10-CM | POA: Diagnosis not present

## 2015-02-26 LAB — BASIC METABOLIC PANEL
Anion gap: 7 (ref 5–15)
BUN: 9 mg/dL (ref 6–20)
CHLORIDE: 106 mmol/L (ref 101–111)
CO2: 24 mmol/L (ref 22–32)
CREATININE: 0.74 mg/dL (ref 0.44–1.00)
Calcium: 8.8 mg/dL — ABNORMAL LOW (ref 8.9–10.3)
GFR calc Af Amer: >60 mL/min (ref >60)
GFR calc non Af Amer: >60 mL/min (ref >60)
GLUCOSE: 166 mg/dL — AB (ref 65–99)
Potassium: 4 mmol/L (ref 3.5–5.1)
Sodium: 137 mmol/L (ref 135–145)

## 2015-02-26 LAB — CBC
HCT: 33.9 % — ABNORMAL LOW (ref 35.0–47.0)
Hemoglobin: 11.7 g/dL — ABNORMAL LOW (ref 12.0–16.0)
MCH: 28.1 pg (ref 26.0–34.0)
MCHC: 34.5 g/dL (ref 32.0–36.0)
MCV: 81.4 fL (ref 80.0–100.0)
PLATELETS: 157 10*3/uL (ref 150–440)
RBC: 4.16 MIL/uL (ref 3.80–5.20)
RDW: 14.4 % (ref 11.5–14.5)
WBC: 8.4 10*3/uL (ref 3.6–11.0)

## 2015-02-26 MED ORDER — OXYCODONE HCL 5 MG PO TABS: 5.0000 mg | ORAL_TABLET | ORAL | Status: DC | PRN

## 2015-02-26 NOTE — Discharge Summary (Addendum)
Physician Discharge Summary  Patient ID: [name] MRN: [id number] DOB/AGE: 03-17-1941 74 y.o.  Admit date: 02/24/2015 Discharge date: 02/27/15  Admission Diagnoses:  Osteoarthritis of medial compartment, right knee  Discharge Diagnoses: Patient Active Problem List   Diagnosis Date Noted  . Status post right partial knee replacement 02/24/2015  Osteoarthritis of medial compartment, right knee  Past Medical History  Diagnosis Date  . Atrial fibrillation   . Hypertension   . Chronic kidney disease     UTI  . GERD (gastroesophageal reflux disease)   . Arthritis      Transfusion: None   Consultants (if any):  None  Discharged Condition: Improved  Hospital Course: [name] is an 74 y.o. female who was admitted 02/24/2015 with a diagnosis of osteoarthritis of medial compartment, right knee and went to the operating room on 02/24/2015 and underwent the above named procedures.    Surgeries: Procedure(s): UNICOMPARTMENTAL KNEE on 02/24/2015 Patient tolerated the surgery well. Taken to PACU where she was stabilized and then transferred to the orthopedic floor.  Started on Lovenox  q 24 hrs. Foot pumps applied bilaterally at 80 mm. Heels elevated on bed with rolled towels. No evidence of DVT. Negative Homan. Physical therapy started on day #1 for gait training and transfer. OT started day #1 for ADL and assisted devices.  Patient's IV was d/c on POD2 and Foley was removed POD1.  Implants: All-cemented [employer] [place] system with a Small femoral component, a "C" sized tibial tray, and a 5 mm meniscal bearing insert.  She was given perioperative antibiotics:  Anti-infectives    Start     Dose/Rate Route Frequency Ordered Stop   02/24/15 1130  clindamycin (CLEOCIN) IVPB 600 mg     600 mg 100 mL/hr over 30 Minutes Intravenous Every 6 hours 02/24/15 1118 02/24/15 2309   02/24/15 0600  clindamycin (CLEOCIN) IVPB 900 mg     900 mg 100 mL/hr over 30 Minutes Intravenous   Once 02/24/15 0548 02/24/15 0750    .  She was given sequential compression devices, early ambulation, and lovenox for DVT prophylaxis.  She benefited maximally from the hospital stay and there were no complications.    Recent vital signs:  Filed Vitals:   02/26/15 0744  BP: 129/66  Pulse: 80  Temp: 98.7 F (37.1 C)  Resp: 18    Recent laboratory studies:  Lab Results  Component Value Date   HGB 11.7* 02/26/2015   HGB 11.5* 02/25/2015   HGB 13.2 02/12/2015   Lab Results  Component Value Date   WBC 8.4 02/26/2015   PLT 157 02/26/2015   Lab Results  Component Value Date   INR 0.91 02/12/2015   Lab Results  Component Value Date   NA 137 02/26/2015   K 4.0 02/26/2015   CL 106 02/26/2015   CO2 24 02/26/2015   BUN 9 02/26/2015   CREATININE 0.74 02/26/2015   GLUCOSE 166* 02/26/2015    Discharge Medications:     Medication List    TAKE these medications        ALIGN 4 MG Caps  Take 1 capsule by mouth daily.     BAYER ASPIRIN EC LOW DOSE 81 MG EC tablet  Generic drug:  aspirin  Take 81 mg by mouth daily. Swallow whole.     Biotin 5000 MCG Caps  Take 1 capsule by mouth daily.     bisacodyl 5 MG EC tablet  Commonly known as:  DULCOLAX  Take 5 mg  by mouth daily.     CALTRATE 600+D 600-800 MG-UNIT Tabs  Generic drug:  Calcium Carb-Cholecalciferol  Take 1 tablet by mouth daily.     CENTRUM PO  Take 1 tablet by mouth daily.     cholecalciferol 1000 UNITS tablet  Commonly known as:  VITAMIN D  Take 1,000 Units by mouth daily.     Cranberry 400 MG Caps  Take 1 capsule by mouth daily.     estradiol 0.1 MG/GM vaginal cream  Commonly known as:  ESTRACE  Place 1 Applicatorful vaginally daily.     fluticasone 50 MCG/ACT nasal spray  Commonly known as:  FLONASE  Place 1 spray into both nostrils daily.     HYDROcodone-acetaminophen 5-325 MG per tablet  Commonly known as:  NORCO/VICODIN  Take 1 tablet by mouth every 6 (six) hours as needed for moderate  pain.     lisinopril-hydrochlorothiazide 20-12.5 MG per tablet  Commonly known as:  PRINZIDE,ZESTORETIC  Take 1 tablet by mouth daily.     nystatin-triamcinolone cream  Commonly known as:  MYCOLOG II  Apply 1 application topically as needed.     omeprazole 20 MG capsule  Commonly known as:  PRILOSEC  Take 20 mg by mouth at bedtime.     OSTEO BI-FLEX TRIPLE STRENGTH Tabs  Take 1 tablet by mouth 2 (two) times daily.     oxyCODONE 5 MG immediate release tablet  Commonly known as:  Oxy IR/ROXICODONE  Take 1-2 tablets (5-10 mg total) by mouth every 4 (four) hours as needed for breakthrough pain.     potassium chloride 10 MEQ CR capsule  Commonly known as:  MICRO-K  Take 10 mEq by mouth daily.     psyllium 58.6 % powder  Commonly known as:  METAMUCIL  Take 1 packet by mouth daily.     ranitidine 150 MG tablet  Commonly known as:  ZANTAC  Take 150 mg by mouth daily.     SYSTANE BALANCE 0.6 % Soln  Generic drug:  Propylene Glycol  Apply 1 drop to eye 2 (two) times daily.     zolpidem 10 MG tablet  Commonly known as:  AMBIEN  Take 10 mg by mouth at bedtime as needed for sleep.        Diagnostic Studies: No results found.  Disposition: 01-Home or Self Care with home-health PT.  The patient's nausea and vomiting has significantly improved.  She complains of mild pain this AM.  Most recent temp 98.7 and HR 80.  Pt has urinated without a Foley and is tolerating PO intake well.  Will ambulate with PT today.  Depending on PT evaluation today, and pain under control, will discharge home today with home-health PT.  Pain medications prescribed and Care-management has called in Lovenox 40mg  q 24 hours.  Addendum:  The patient was kept in the hospital for one more additional day due to poor results with PT and pain control.  She has urinated without a Foley and is tolerating PO intake well.  Pt will be discharged to SNF due to slow advancement with PT.  Pain medications have been  prescribe, will prescribe Lovenox 40mg  q 24 hours as well as some Zofran for the patient.       Follow-up Information    Follow up with Christena Flake, MD In 10 days.   Specialty:  Surgery   Why:  For staple removal, For wound re-check   Contact information:   [address] [place]  [address] [phone]      Signed: Meriel Pica PA-C 02/26/2015, 7:48 AM

## 2015-02-26 NOTE — Progress Notes (Signed)
Physical Therapy Treatment Patient Details Name: [name] MRN: [id number] DOB: 04/29/1941 Today's Date: 02/26/2015    History of Present Illness Pt is a 74 yo female who was admitted to the hospital s/p R partial knee replacement on 02/24/15    PT Comments    Pt progressing slowly. Pt dealing with severe pain currently in RLE and nausea. Pt demonstrates significant weakness in R quad. Limited range of motion in R knee extension and flexion. Pt demonstrates safety concerns with STS transfers and requires assist for RLE in and out of bed. Pt relies on trapeze with cues to use BLEs as well for repositioning upward in bed. Ambulation limited and slow with difficulty weightbearing on RLE. Pt also requires cueing for proper rolling walker placement and to avoid stepping too close into rolling walker for balance/safety. Pt relies heavily on rolling walker. Pt states "I;m wiped out" post session. Lengthy discussion with patient regarding home situation. Pt notes she does have a means to enter the home without steps; however it is a narrow space and she is unsure if she can maneuver; therapist did have pt practice sidestepping with rolling walker. Pt performs with effort. Upon further questioning pt lives alone and will not have 24 hour family care; pt's daughter lives in [place] with her own commitments. Pt cannot safely return home alone at this time to care for herself and manage household activities. Discussed with care management. Plan to see pt this pm.   Follow Up Recommendations  SNF     Equipment Recommendations  None recommended by PT    Recommendations for Other Services       Precautions / Restrictions Precautions Precautions: Fall Restrictions Weight Bearing Restrictions: Yes RLE Weight Bearing: Weight bearing as tolerated    Mobility  Bed Mobility Overal bed mobility: Needs Assistance Bed Mobility: Supine to Sit     Supine to sit: Min assist     General bed mobility  comments:  (Needs assist for RLE)  Transfers Overall transfer level: Needs assistance Equipment used: Rolling walker (2 wheeled) Transfers: Sit to/from Stand Sit to Stand: Min guard (Cues for proper hand placement and use of RLE)         General transfer comment: Pt attempts to pull up from rw despite cueing. Performs with little weightbearing through RLE due to pain increasing overall effort  Ambulation/Gait Ambulation/Gait assistance: Min guard Ambulation Distance (Feet): 65 Feet Assistive device: Rolling walker (2 wheeled) Gait Pattern/deviations: Step-to pattern;Decreased step length - right;Decreased step length - left;Decreased stance time - right;Decreased weight shift to right;Decreased dorsiflexion - right;Antalgic (cues for proper rw placement before stepping) Gait velocity: decreased Gait velocity interpretation: <1.8 ft/sec, indicative of risk for recurrent falls General Gait Details: Difficulty placing R foot flat on floor and bearing weight through RLE. R knee partial flexion. Pt notes she feels as if R knee will buckle (note pt has very poor QS)    Stairs            Wheelchair Mobility    Modified Rankin (Stroke Patients Only)       Balance Overall balance assessment: No apparent balance deficits (not formally assessed)                                  Cognition Arousal/Alertness: Awake/alert Behavior During Therapy: WFL for tasks assessed/performed Overall Cognitive Status: Within Functional Limits for tasks assessed  Exercises Total Joint Exercises Ankle Circles/Pumps: AROM;20 reps;Supine;Both Quad Sets: Strengthening;Both;20 reps;Supine (with RLE elevated at ankle. Poor QS on R) Short Arc Quad: AAROM;Right;20 reps;Supine Long Arc Quad: AAROM;Right;10 reps;Seated Knee Flexion: AAROM;Right;10 reps;Seated (performed in sets of 3 positions each rep for stretch) Goniometric ROM: 5-68 degrees Other  Exercises Other Exercises: also attempted QS in stand with weight shifting to the R with poor result    General Comments        Pertinent Vitals/Pain Pain Assessment: 0-10 Pain Score: 10-Worst pain ever Pain Location: R knee Pain Descriptors / Indicators: Constant (nauseating) Pain Intervention(s): Limited activity within patient's tolerance;Monitored during session;Premedicated before session    Home Living                      Prior Function            PT Goals (current goals can now be found in the care plan section) Progress towards PT goals: Progressing toward goals (slowly)    Frequency  BID    PT Plan Discharge plan needs to be updated    Co-evaluation             End of Session Equipment Utilized During Treatment: Gait belt Activity Tolerance: Patient limited by pain Patient left: in bed;with call bell/phone within reach;with bed alarm set (nursing contacted for nausea medication)     Time: 6045-4098 PT Time Calculation (min) (ACUTE ONLY): 39 min  Charges:  $Gait Training: 8-22 mins $Therapeutic Exercise: 23-37 mins                    G Codes:      [name] 02/26/2015, 10:37 AM

## 2015-02-26 NOTE — Care Management Important Message (Signed)
Important Message  Patient Details  Name: MARIVEL MCCLARTY MRN: [id number] Date of Birth: March 23, 1941   Medicare Important Message Given:  Yes-second notification given    Olegario Messier A Allmond 02/26/2015, 10:58 AM

## 2015-02-26 NOTE — Care Management (Signed)
PT is now recommending SNF and patient requests [facility]; CSW following. RNCM to notify [facility] and cancel Lovenox with her pharmacy if patient goes to SNF.

## 2015-02-26 NOTE — Progress Notes (Signed)
CSW seeking SNF placement for patient.  Full assessment to follow.   Shresta Risden, MSW, LCSWA  [phone]   

## 2015-02-26 NOTE — Progress Notes (Signed)
Pt. Transferred to room 158 d/t buzzing noise in room.

## 2015-02-26 NOTE — Discharge Instructions (Signed)

## 2015-02-26 NOTE — Progress Notes (Signed)
  Subjective: 2 Days Post-Op Procedure(s) (LRB): UNICOMPARTMENTAL KNEE (Right) Patient reports pain as patient is just waking up and is reporting no pain at the moment.  Nurse stating that when she ambulates she complains of 10 out of 10 pain. Patient is well, and has had no acute complaints or problems Plan is to go home with homehealth PT. after hospital stay. Negative for chest pain and shortness of breath Fever: yes, most recent temp 101.6.  Pt's O2 dropped during the night following dosage of Nucenta. Gastrointestinal:Negative for nausea and vomiting  Objective: Vital signs in last 24 hours: Temp:  [99.1 F (37.3 C)-101.6 F (38.7 C)] 101.6 F (38.7 C) (09/08 0405) Pulse Rate:  [72-115] 114 (09/08 0408) Resp:  [16-18] 18 (09/08 0405) BP: (111-158)/(44-79) 158/54 mmHg (09/08 0405) SpO2:  [77 %-100 %] 91 % (09/08 0410)  Intake/Output from previous day:  Intake/Output Summary (Last 24 hours) at 02/26/15 0734 Last data filed at 02/26/15 0427  Gross per 24 hour  Intake   3120 ml  Output   1000 ml  Net   2120 ml    Intake/Output this shift:    Labs:  Recent Labs  02/25/15 0431 02/26/15 0436  HGB 11.5* 11.7*    Recent Labs  02/25/15 0431 02/26/15 0436  WBC 6.1 8.4  RBC 3.98 4.16  HCT 32.9* 33.9*  PLT 151 157    Recent Labs  02/25/15 0431 02/26/15 0436  NA 139 137  K 3.8 4.0  CL 106 106  CO2 29 24  BUN 8 9  CREATININE 0.80 0.74  GLUCOSE 118* 166*  CALCIUM 8.6* 8.8*   No results for input(s): LABPT, INR in the last 72 hours.   EXAM General - Patient is Alert, Appropriate and Oriented Extremity - Neurologically intact ABD soft Intact pulses distally Dorsiflexion/Plantar flexion intact Incision: scant drainage No cellulitis present Dressing/Incision - blood tinged drainage Motor Function - intact, moving foot and toes well on exam.   Abdomen is soft this morning, less distention than yesterday.  Past Medical History  Diagnosis Date  .  Atrial fibrillation   . Hypertension   . Chronic kidney disease     UTI  . GERD (gastroesophageal reflux disease)   . Arthritis     Assessment/Plan: 2 Days Post-Op Procedure(s) (LRB): UNICOMPARTMENTAL KNEE (Right) Active Problems:   Status post right partial knee replacement  Estimated body mass index is 29.51 kg/(m^2) as calculated from the following:   Height as of this encounter:  (1.626 m).   Weight as of this encounter: 78.019 kg (172 lb). Advance diet Up with therapy D/C IV fluids   Temp 101.6 and HR 114 at 0405, she took Nucynta for sleep and her O2 sats dropped during the night.  WBC 8.4 and she has no complaints of SOB or burning with urination. Repeat vitals at 745 show temp 98.7, HR 80 and SpO2 95. Encouraged using incentive spirometer. Pt has urinated since Foley has been removed.  Pt is tolerating PO intake without N/V. Will ambulate with PT today without O2.  If patient is able to accomplish this and her pain is tolerated while ambulating will discharge home today with PT.  DVT Prophylaxis - Lovenox, Foot Pumps and TED hose Weight-Bearing as tolerated to right leg  J. Horris Latino, PA-C [facility] 02/26/2015, 7:34 AM

## 2015-02-26 NOTE — Progress Notes (Signed)
Physical Therapy Treatment Patient Details Name: [name] MRN: [id number] DOB: 10/18/1940 Today's Date: 02/26/2015    History of Present Illness Pt is a 74 yo female who was admitted to the hospital s/p R partial knee replacement on 02/24/15    PT Comments    Pt with less nausea this p.m and less pain although pt notes R knee is still very painful. Pt's daughter is present for session. Pt re educated and daughter educated on bed mobility, transfers, exercises and ambulation for proper safety and techniques. Pt continues to require assist for in/out of bed with RLE and taught self assist, which pt able to demonstrate getting out of bed, but not into bed. Continues to require cues for safe hand placement with STS transfers. Increased time spent with seated active assisted range of motion and stretching of R knee and in static stand with weight shifting and quad set in preparation for ambulation. Pt demonstrated an improved ability to place R foot flat on the floor for ambulation for weight bearing. Pt continues to rely heavily on rolling walker. Continue to recommend skilled nursing facility for discharge to progress all functional mobility quality and safety.   Follow Up Recommendations  SNF     Equipment Recommendations  None recommended by PT    Recommendations for Other Services       Precautions / Restrictions Precautions Precautions: Fall Restrictions Weight Bearing Restrictions: Yes RLE Weight Bearing: Weight bearing as tolerated    Mobility  Bed Mobility Overal bed mobility: Needs Assistance Bed Mobility: Supine to Sit     Supine to sit: Min assist     General bed mobility comments:  (; taught pt self assist as well)  Transfers Overall transfer level: Needs assistance Equipment used: Rolling walker (2 wheeled) Transfers: Sit to/from Stand Sit to Stand: Min guard (Cues for proper hand placement and use of RLE)         General transfer comment: Pt attempts  to pull up from rw despite cueing. Performs with little weightbearing through RLE due to pain increasing overall effort  Ambulation/Gait Ambulation/Gait assistance: Min guard Ambulation Distance (Feet): 60 Feet Assistive device: Rolling walker (2 wheeled) Gait Pattern/deviations: Step-through pattern (Partial step through) Gait velocity: decreased Gait velocity interpretation: <1.8 ft/sec, indicative of risk for recurrent falls General Gait Details: Improved ability to place weight through RLE after practicing weight shifting with quad set in static stand x several minutes. Also performed seated stretching and aarom exercises to loosen R knee    Stairs            Wheelchair Mobility    Modified Rankin (Stroke Patients Only)       Balance Overall balance assessment: No apparent balance deficits (not formally assessed)                                  Cognition Arousal/Alertness: Awake/alert Behavior During Therapy: WFL for tasks assessed/performed Overall Cognitive Status: Within Functional Limits for tasks assessed                      Exercises Total Joint Exercises Ankle Circles/Pumps: AROM;20 reps;Supine;Both Quad Sets: Strengthening;Standing;20 reps (with weightshifting and 3-5 second hold) Short Arc Quad: AAROM;Right;20 reps;Supine Long Arc Quad: AAROM;Right;10 reps;Seated Knee Flexion: AAROM;Right;10 reps;Seated (performed in sets of 3 positions each rep for stretch) Other Exercises Other Exercises: also attempted QS in stand with weight shifting to the  R with poor result    General Comments        Pertinent Vitals/Pain Pain Assessment: 0-10 Pain Score: 6  Pain Location: R knee Pain Descriptors / Indicators: Constant;Aching;Operative site guarding Pain Intervention(s): Monitored during session;Ice applied    Home Living                      Prior Function            PT Goals (current goals can now be found in the  care plan section) Progress towards PT goals: Progressing toward goals (slowly)    Frequency  BID    PT Plan Current plan remains appropriate    Co-evaluation             End of Session Equipment Utilized During Treatment: Gait belt Activity Tolerance: Patient limited by pain Patient left: in bed;with call bell/phone within reach;with bed alarm set (nursing contacted for nausea medication)     Time: 1610-9604 PT Time Calculation (min) (ACUTE ONLY): 39 min  Charges:  $Gait Training: 8-22 mins $Therapeutic Exercise: 23-37 mins                    G Codes:      [name] 02/26/2015, 2:15 PM

## 2015-02-27 ENCOUNTER — Encounter
Admission: RE | Admit: 2015-02-27 | Discharge: 2015-02-27 | Disposition: A | Discharge status: Home / Self Care | Payer: Medicare Other | Source: Ambulatory Visit | Attending: Internal Medicine | Admitting: Internal Medicine

## 2015-02-27 ENCOUNTER — Inpatient Hospital Stay: Payer: Medicare Other

## 2015-02-27 LAB — LIPASE, BLOOD: LIPASE: 15 U/L — AB (ref 22–51)

## 2015-02-27 LAB — URINALYSIS COMPLETE WITH MICROSCOPIC (ARMC ONLY)
BILIRUBIN URINE: NEGATIVE
Bacteria, UA: NONE SEEN
GLUCOSE, UA: NEGATIVE mg/dL
Hgb urine dipstick: NEGATIVE
Ketones, ur: NEGATIVE mg/dL
Leukocytes, UA: NEGATIVE
Nitrite: NEGATIVE
Protein, ur: NEGATIVE mg/dL
Specific Gravity, Urine: 1.005 (ref 1.005–1.030)
pH: 5 (ref 5.0–8.0)

## 2015-02-27 LAB — BASIC METABOLIC PANEL
ANION GAP: 7 (ref 5–15)
BUN: 10 mg/dL (ref 6–20)
CHLORIDE: 99 mmol/L — AB (ref 101–111)
CO2: 32 mmol/L (ref 22–32)
CREATININE: 0.87 mg/dL (ref 0.44–1.00)
Calcium: 9 mg/dL (ref 8.9–10.3)
GFR calc non Af Amer: >60 mL/min (ref >60)
Glucose, Bld: 99 mg/dL (ref 65–99)
POTASSIUM: 3.2 mmol/L — AB (ref 3.5–5.1)
SODIUM: 138 mmol/L (ref 135–145)

## 2015-02-27 LAB — BILIRUBIN, TOTAL: BILIRUBIN TOTAL: 0.8 mg/dL (ref 0.3–1.2)

## 2015-02-27 MED ORDER — ONDANSETRON HCL 4 MG PO TABS: 4.0000 mg | ORAL_TABLET | Freq: Four times a day (QID) | ORAL | Status: DC | PRN

## 2015-02-27 MED ORDER — POTASSIUM CHLORIDE CRYS ER 20 MEQ PO TBCR: 20.0000 meq | EXTENDED_RELEASE_TABLET | Freq: Two times a day (BID) | ORAL | Status: DC

## 2015-02-27 MED ORDER — SIMETHICONE 80 MG PO CHEW
80.0000 mg | CHEWABLE_TABLET | Freq: Four times a day (QID) | ORAL | Status: DC | PRN
  Administered 2015-02-27 (×2): 80 mg via ORAL
  Filled 2015-02-27 (×2): qty 1

## 2015-02-27 MED ORDER — SIMETHICONE 80 MG PO CHEW: 80.0000 mg | CHEWABLE_TABLET | Freq: Four times a day (QID) | ORAL | Status: DC | PRN

## 2015-02-27 MED ORDER — ENOXAPARIN SODIUM 40 MG/0.4ML ~~LOC~~ SOLN: 40.0000 mg | SUBCUTANEOUS | Status: DC

## 2015-02-27 MED ORDER — POTASSIUM CHLORIDE CRYS ER 20 MEQ PO TBCR
20.0000 meq | EXTENDED_RELEASE_TABLET | Freq: Three times a day (TID) | ORAL | Status: DC
  Administered 2015-02-27 (×2): 20 meq via ORAL
  Filled 2015-02-27 (×2): qty 1

## 2015-02-27 NOTE — Progress Notes (Signed)
Pt. Awake with c/o nausea

## 2015-02-27 NOTE — Clinical Social Work Placement (Addendum)
   CLINICAL SOCIAL WORK PLACEMENT  NOTE  Date:  02/27/2015  Patient Details  Name: [name] MRN: [id number] Date of Birth: 1940/08/09  Clinical Social Work is seeking post-discharge placement for this patient at the Skilled  Nursing Facility level of care (*CSW will initial, date and re-position this form in  chart as items are completed):  Yes   Patient/family provided with [facility] Clinical Social Work Department's list of facilities offering this level of care within the geographic area requested by the patient (or if unable, by the patient's family).  Yes   Patient/family informed of their freedom to choose among providers that offer the needed level of care, that participate in Medicare, Medicaid or managed care program needed by the patient, have an available bed and are willing to accept the patient.  Yes   Patient/family informed of [facility]'s ownership interest in [facility] and [facility], as well as of the fact that they are under no obligation to receive care at these facilities.  PASRR submitted to EDS on 02/27/15     PASRR number received on 02/27/15     Existing PASRR number confirmed on       FL2 transmitted to all facilities in geographic area requested by pt/family on 02/26/15     FL2 transmitted to all facilities within larger geographic area on       Patient informed that his/her managed care company has contracts with or will negotiate with certain facilities, including the following:            Patient/family informed of bed offers received.  Patient chooses bed at  [facility])     Physician recommends and patient chooses bed at      Patient to be transferred to  [facility]) on 02/27/15.  Patient to be transferred to facility by [facility] place     Patient family notified on 02/27/15 of transfer.  Name of family member notified:  daughter-    Aram Beecham  PHYSICIAN       Additional Comment:     _______________________________________________ Liliana Cline, LCSW 02/27/2015, 11:15 AM

## 2015-02-27 NOTE — Progress Notes (Signed)
Pt. Refused dilaudid IV d/t it makes her nauseated and requested oxycodone.

## 2015-02-27 NOTE — Progress Notes (Addendum)
Patient alert and oriented. Patient has no complaints of pain. Report called to [facility] facility - Nade[facility]. Accepting report. Patient will be transported via EMS.Daughter at bedside.    Edit: Patient received zofran@ 1411  and tramadol @ 1536. Patient has poor po intake.

## 2015-02-27 NOTE — Care Management Note (Signed)
Case Management Note  Patient Details  Name: MYSTIE ORMAND MRN: [id number] Date of Birth: November 24, 1940  Subjective/Objective:   Tim with [facility] notified of patient seeking SNF placement. TC to [facility] and lovenox cancelled.                 Action/Plan: SNF  Expected Discharge Date:                  Expected Discharge Plan:  Skilled Nursing Facility  In-House Referral:     Discharge planning Services  CM Consult  Post Acute Care Choice:    Choice offered to:  Patient  DME Arranged:    DME Agency:     HH Arranged:    HH Agency:     Status of Service:  In process, will continue to follow  Medicare Important Message Given:  Yes-second notification given Date Medicare IM Given:    Medicare IM give by:    Date Additional Medicare IM Given:    Additional Medicare Important Message give by:     If discussed at Long Length of Stay Meetings, dates discussed:    Additional Comments:  Marily Memos, RN 02/27/2015, 10:06 AM

## 2015-02-27 NOTE — Progress Notes (Signed)
Patient for d/c today to SNF bed at [facility]. Daughter at bedside- she and patient agreeable to this plan- plan transfer via EMS. Reece Levy, MSW, [place] [phone]  --

## 2015-02-27 NOTE — Progress Notes (Signed)
  Subjective: 3 Days Post-Op Procedure(s) (LRB): UNICOMPARTMENTAL KNEE (Right) Patient reports pain as severe according to the patient.  Appears to be resting comfortably during the exam. Patient is well, but has had some minor complaints of GERD symptoms. Plan is to go Skilled nursing facility after hospital stay. Negative for chest pain and shortness of breath.  Pt complaining of GERD symptoms. Fever: no Gastrointestinal:Negative for nausea and vomiting this AM.  Objective: Vital signs in last 24 hours: Temp:  [98.5 F (36.9 C)-99.4 F (37.4 C)] 98.6 F (37 C) (09/09 0337) Pulse Rate:  [67-95] 73 (09/09 0338) Resp:  [18] 18 (09/09 0337) BP: (101-139)/(42-66) 114/42 mmHg (09/09 0338) SpO2:  [93 %-96 %] 93 % (09/09 0337)  Intake/Output from previous day:  Intake/Output Summary (Last 24 hours) at 02/27/15 0733 Last data filed at 02/26/15 2041  Gross per 24 hour  Intake    480 ml  Output   1035 ml  Net   -555 ml    Intake/Output this shift:    Labs:  Recent Labs  02/25/15 0431 02/26/15 0436  HGB 11.5* 11.7*    Recent Labs  02/25/15 0431 02/26/15 0436  WBC 6.1 8.4  RBC 3.98 4.16  HCT 32.9* 33.9*  PLT 151 157    Recent Labs  02/26/15 0436 02/27/15 0631  NA 137 138  K 4.0 3.2*  CL 106 99*  CO2 24 32  BUN 9 10  CREATININE 0.74 0.87  GLUCOSE 166* 99  CALCIUM 8.8* 9.0   No results for input(s): LABPT, INR in the last 72 hours.   EXAM General - Patient is Alert, Appropriate and Oriented Extremity - Neurologically intact ABD soft Sensation intact distally Dorsiflexion/Plantar flexion intact Incision: scant drainage No cellulitis present Dressing/Incision - blood tinged drainage Motor Function - intact, moving foot and toes well on exam.   Abdomen soft on exam with normal BS without tympany.  Past Medical History  Diagnosis Date  . Atrial fibrillation   . Hypertension   . Chronic kidney disease     UTI  . GERD (gastroesophageal reflux  disease)   . Arthritis     Assessment/Plan: 3 Days Post-Op Procedure(s) (LRB): UNICOMPARTMENTAL KNEE (Right) Active Problems:   Status post right partial knee replacement  Estimated body mass index is 29.51 kg/(m^2) as calculated from the following:   Height as of this encounter:  (1.626 m).   Weight as of this encounter: 78.019 kg (172 lb). Advance diet Up with therapy   K+ 3.2 this AM.  Klor-Con 20 mEq BID given for today in addition to her scheduled Klor-Con 10 mEq dosage. Pt is complaining of acid reflux this AM.  Likely due to prolonged periods in the supine position. Most recent temp 98.6 and HR 73.  UA results not seen, CXR shows no signs of infection. Pt is urinating well and has had a BM since being in the hospital. Pt will be going to [facility] for rehab following discharge from hospital.  DVT Prophylaxis - Lovenox, Foot Pumps and TED hose Weight-Bearing as tolerated to right leg  J. Horris Latino, PA-C [facility] 02/27/2015, 7:33 AM

## 2015-02-27 NOTE — Progress Notes (Signed)
POD 3. VSS. Pt. Has had a very restless night. Up to bathroom voiding multiple times. C/o nausea and pain throughout the night. Medicated per MAR. Dressing clean dry and intact. Polarcare on and running. Ambulating around room with walker and stand-by assist. Seems to be resting quietly at this time. Will continue to monitor.

## 2015-02-27 NOTE — Progress Notes (Signed)
Physical Therapy Treatment Patient Details Name: TODD ARGABRIGHT MRN: [id number] DOB: 03-22-1941 Today's Date: 02/27/2015    History of Present Illness Pt is a 74 yo female who was admitted to the hospital s/p R partial knee replacement on 02/24/15    PT Comments    Pt continues to be pain limited and though she shows good effort and generally does well with ambulation/exercises despite nausea and pain.  She shows good strength, increasing (though still stiff) ROM and mobility. She is hesitant and unsure of herself but has shown consistent improvement.   Follow Up Recommendations  SNF     Equipment Recommendations       Recommendations for Other Services       Precautions / Restrictions Precautions Precautions: Fall Restrictions RLE Weight Bearing: Weight bearing as tolerated    Mobility  Bed Mobility Overal bed mobility: Needs Assistance Bed Mobility: Supine to Sit     Supine to sit: Min guard     General bed mobility comments: Pt able to use rails effectively, needs only VCs and encouragement  Transfers Overall transfer level: Modified independent Equipment used: Rolling walker (2 wheeled) Transfers: Sit to/from Stand Sit to Stand: Min guard         General transfer comment: Pt needs some time at EOB secondary to dizziness, pain and feeling sick to her stomach.    Ambulation/Gait Ambulation/Gait assistance: Min guard Ambulation Distance (Feet): 100 Feet Assistive device: Rolling walker (2 wheeled)       General Gait Details: Pt still with some hesitancy on her R LE and generally is pain limited but ultimately she is showing improvement with ambulation distance and tolerance.  Pt feeling ill on arrival back to room, also signficantly fatigued.   Stairs            Wheelchair Mobility    Modified Rankin (Stroke Patients Only)       Balance                                    Cognition Arousal/Alertness: Awake/alert Behavior  During Therapy: WFL for tasks assessed/performed Overall Cognitive Status: Within Functional Limits for tasks assessed                      Exercises Total Joint Exercises Ankle Circles/Pumps: AROM;20 reps;Supine;Both Quad Sets: Strengthening;Standing;20 reps Heel Slides: 10 reps;AROM;AAROM Hip ABduction/ADduction: AROM;Strengthening;10 reps Straight Leg Raises: AROM;10 reps;Strengthening (pt shows good strength with SLR against some resistance) Goniometric ROM: 3-83    General Comments        Pertinent Vitals/Pain Pain Assessment: 0-10 Pain Score: 8  Pain Location: R knee, pt reports that pain is very signficant    Home Living                      Prior Function            PT Goals (current goals can now be found in the care plan section) Progress towards PT goals: Progressing toward goals    Frequency  BID    PT Plan Current plan remains appropriate    Co-evaluation             End of Session Equipment Utilized During Treatment: Gait belt Activity Tolerance: Patient limited by pain       Time: 4098-1191 PT Time Calculation (min) (ACUTE ONLY): 32 min  Charges:  $Gait Training:  8-22 mins $Therapeutic Exercise: 8-22 mins                    G Codes:     Loran Senters, [place], DPT [phone]  Malachi Pro 02/27/2015, 12:04 PM

## 2015-02-27 NOTE — Clinical Social Work Note (Signed)
Clinical Social Work Assessment  Patient Details  Name: [name] MRN: [id number] Date of Birth: 01/22/1941  Date of referral:  02/26/15               Reason for consult:  Discharge Planning                Permission sought to share information with:  Family Supports Permission granted to share information::  Yes, Verbal Permission Granted  Name::      (daughter)  Agency::  SNFs  Relationship::     Contact Information:     Housing/Transportation Living arrangements for the past 2 months:  Single Family Home Source of Information:  Patient Patient Interpreter Needed:  None Criminal Activity/Legal Involvement Pertinent to Current Situation/Hospitalization:  No - Comment as needed Significant Relationships:  Adult Children Lives with:  Self Do you feel safe going back to the place where you live?  No Need for family participation in patient care:  No (Coment)  Care giving concerns:  PT recommending SNF    Social Worker assessment / plan:  Patient agreeable to SNF- FL[facility] and PASARR completion for SNF search  Employment status:  Retired Community education officer information:  Medicare PT Recommendations:  Skilled Nursing Facility Information / Referral to community resources:  Skilled Nursing Facility  Patient/Family's Response to care:  Patient is interested in SNF at [facility]-  She prefers [facility]	[place] and is agreeable to [facility] in the county.  Patient/Family's Understanding of and Emotional Response to Diagnosis, Current Treatment, and Prognosis:  Patient is working with therapy and is motivated to progress and get better- she feels SNF will be beneficial post hospitalization pior to returning home alone. Daughter lives in [place] per patient.  Emotional Assessment Appearance:  Appears younger than stated age Attitude/Demeanor/Rapport:  Other (appropriate) Affect (typically observed):  Accepting, Appropriate Orientation:  Oriented to Self, Oriented to Place, Oriented to  Time, Oriented  to Situation Alcohol / Substance use:  Not Applicable Psych involvement (Current and /or in the community):  No (Comment)  Discharge Needs  Concerns to be addressed:  Discharge Planning Concerns Readmission within the last 30 days:  No Current discharge risk:  Lives alone, Physical Impairment Barriers to Discharge:  No Barriers Identified   Liliana Cline, LCSW 02/27/2015, 11:08 AM

## 2015-03-01 ENCOUNTER — Other Ambulatory Visit
Admission: RE | Admit: 2015-03-01 | Discharge: 2015-03-01 | Disposition: A | Discharge status: Home / Self Care | Payer: Medicare Other | Source: Ambulatory Visit | Attending: Internal Medicine | Admitting: Internal Medicine

## 2015-03-01 DIAGNOSIS — R410 Disorientation, unspecified: Secondary | ICD-10-CM | POA: Insufficient documentation

## 2015-03-01 LAB — URINALYSIS COMPLETE WITH MICROSCOPIC (ARMC ONLY)
Bilirubin Urine: NEGATIVE
GLUCOSE, UA: NEGATIVE mg/dL
Hgb urine dipstick: NEGATIVE
Ketones, ur: NEGATIVE mg/dL
LEUKOCYTES UA: NEGATIVE
Nitrite: NEGATIVE
PROTEIN: NEGATIVE mg/dL
SPECIFIC GRAVITY, URINE: 1.015 (ref 1.005–1.030)
pH: 8 (ref 5.0–8.0)

## 2015-03-02 LAB — URINE CULTURE

## 2015-03-03 LAB — CBC WITH DIFFERENTIAL/PLATELET
BASOS ABS: 0 10*3/uL (ref 0–0.1)
BASOS PCT: 1 %
Eosinophils Absolute: 0.1 10*3/uL (ref 0–0.7)
Eosinophils Relative: 1 %
HEMATOCRIT: 35.6 % (ref 35.0–47.0)
HEMOGLOBIN: 11.9 g/dL — AB (ref 12.0–16.0)
LYMPHS PCT: 24 %
Lymphs Abs: 1.4 10*3/uL (ref 1.0–3.6)
MCH: 27.2 pg (ref 26.0–34.0)
MCHC: 33.5 g/dL (ref 32.0–36.0)
MCV: 81.3 fL (ref 80.0–100.0)
Monocytes Absolute: 0.5 10*3/uL (ref 0.2–0.9)
Monocytes Relative: 9 %
NEUTROS ABS: 3.8 10*3/uL (ref 1.4–6.5)
NEUTROS PCT: 65 %
Platelets: 223 10*3/uL (ref 150–440)
RBC: 4.38 MIL/uL (ref 3.80–5.20)
RDW: 14.2 % (ref 11.5–14.5)
WBC: 5.7 10*3/uL (ref 3.6–11.0)

## 2015-03-03 LAB — COMPREHENSIVE METABOLIC PANEL
ALBUMIN: 3.2 g/dL — AB (ref 3.5–5.0)
ALK PHOS: 53 U/L (ref 38–126)
ALT: 26 U/L (ref 14–54)
AST: 46 U/L — AB (ref 15–41)
Anion gap: 8 (ref 5–15)
BILIRUBIN TOTAL: 0.4 mg/dL (ref 0.3–1.2)
BUN: 16 mg/dL (ref 6–20)
CALCIUM: 9.6 mg/dL (ref 8.9–10.3)
CO2: 27 mmol/L (ref 22–32)
CREATININE: 0.7 mg/dL (ref 0.44–1.00)
Chloride: 104 mmol/L (ref 101–111)
GFR calc Af Amer: >60 mL/min (ref >60)
GLUCOSE: 82 mg/dL (ref 65–99)
POTASSIUM: 3.9 mmol/L (ref 3.5–5.1)
Sodium: 139 mmol/L (ref 135–145)
TOTAL PROTEIN: 6.3 g/dL — AB (ref 6.5–8.1)

## 2015-03-20 ENCOUNTER — Encounter: Payer: Self-pay | Admitting: *Deleted

## 2015-03-20 ENCOUNTER — Emergency Department: Payer: Medicare Other

## 2015-03-20 ENCOUNTER — Other Ambulatory Visit: Payer: Self-pay

## 2015-03-20 ENCOUNTER — Emergency Department
Admission: EM | Admit: 2015-03-20 | Discharge: 2015-03-21 | Disposition: A | Discharge status: Home / Self Care | Payer: Medicare Other | Attending: Student | Admitting: Student

## 2015-03-20 DIAGNOSIS — M9689 Other intraoperative and postprocedural complications and disorders of the musculoskeletal system: Secondary | ICD-10-CM | POA: Diagnosis not present

## 2015-03-20 DIAGNOSIS — N189 Chronic kidney disease, unspecified: Secondary | ICD-10-CM | POA: Diagnosis not present

## 2015-03-20 DIAGNOSIS — I129 Hypertensive chronic kidney disease with stage 1 through stage 4 chronic kidney disease, or unspecified chronic kidney disease: Secondary | ICD-10-CM | POA: Insufficient documentation

## 2015-03-20 DIAGNOSIS — R1031 Right lower quadrant pain: Secondary | ICD-10-CM | POA: Diagnosis not present

## 2015-03-20 DIAGNOSIS — R42 Dizziness and giddiness: Secondary | ICD-10-CM | POA: Insufficient documentation

## 2015-03-20 DIAGNOSIS — Z7901 Long term (current) use of anticoagulants: Secondary | ICD-10-CM | POA: Insufficient documentation

## 2015-03-20 DIAGNOSIS — Z7982 Long term (current) use of aspirin: Secondary | ICD-10-CM | POA: Insufficient documentation

## 2015-03-20 DIAGNOSIS — R110 Nausea: Secondary | ICD-10-CM | POA: Diagnosis present

## 2015-03-20 DIAGNOSIS — Y831 Surgical operation with implant of artificial internal device as the cause of abnormal reaction of the patient, or of later complication, without mention of misadventure at the time of the procedure: Secondary | ICD-10-CM | POA: Diagnosis not present

## 2015-03-20 DIAGNOSIS — R410 Disorientation, unspecified: Secondary | ICD-10-CM | POA: Diagnosis not present

## 2015-03-20 DIAGNOSIS — Z79899 Other long term (current) drug therapy: Secondary | ICD-10-CM | POA: Diagnosis not present

## 2015-03-20 DIAGNOSIS — Z87891 Personal history of nicotine dependence: Secondary | ICD-10-CM | POA: Diagnosis not present

## 2015-03-20 LAB — COMPREHENSIVE METABOLIC PANEL
ALT: 22 U/L (ref 14–54)
AST: 38 U/L (ref 15–41)
Albumin: 3.7 g/dL (ref 3.5–5.0)
Alkaline Phosphatase: 64 U/L (ref 38–126)
Anion gap: 8 (ref 5–15)
BILIRUBIN TOTAL: 0.4 mg/dL (ref 0.3–1.2)
BUN: 14 mg/dL (ref 6–20)
CO2: 23 mmol/L (ref 22–32)
CREATININE: 0.72 mg/dL (ref 0.44–1.00)
Calcium: 9.8 mg/dL (ref 8.9–10.3)
Chloride: 105 mmol/L (ref 101–111)
GFR calc Af Amer: >60 mL/min (ref >60)
Glucose, Bld: 89 mg/dL (ref 65–99)
Potassium: 3.3 mmol/L — ABNORMAL LOW (ref 3.5–5.1)
Sodium: 136 mmol/L (ref 135–145)
TOTAL PROTEIN: 6.6 g/dL (ref 6.5–8.1)

## 2015-03-20 LAB — URINALYSIS COMPLETE WITH MICROSCOPIC (ARMC ONLY)
BILIRUBIN URINE: NEGATIVE
Bacteria, UA: NONE SEEN
GLUCOSE, UA: NEGATIVE mg/dL
Hgb urine dipstick: NEGATIVE
Ketones, ur: NEGATIVE mg/dL
Leukocytes, UA: NEGATIVE
Nitrite: NEGATIVE
PH: 7 (ref 5.0–8.0)
Protein, ur: NEGATIVE mg/dL
Specific Gravity, Urine: 1.005 (ref 1.005–1.030)

## 2015-03-20 LAB — CBC WITH DIFFERENTIAL/PLATELET
BASOS ABS: 0.1 10*3/uL (ref 0–0.1)
Basophils Relative: 1 %
Eosinophils Absolute: 0 10*3/uL (ref 0–0.7)
Eosinophils Relative: 0 %
HEMATOCRIT: 37.6 % (ref 35.0–47.0)
Hemoglobin: 12.9 g/dL (ref 12.0–16.0)
LYMPHS PCT: 23 %
Lymphs Abs: 1.6 10*3/uL (ref 1.0–3.6)
MCH: 27.4 pg (ref 26.0–34.0)
MCHC: 34.2 g/dL (ref 32.0–36.0)
MCV: 80.1 fL (ref 80.0–100.0)
MONO ABS: 0.6 10*3/uL (ref 0.2–0.9)
Monocytes Relative: 9 %
NEUTROS ABS: 4.9 10*3/uL (ref 1.4–6.5)
Neutrophils Relative %: 67 %
Platelets: 258 10*3/uL (ref 150–440)
RBC: 4.7 MIL/uL (ref 3.80–5.20)
RDW: 14.4 % (ref 11.5–14.5)
WBC: 7.3 10*3/uL (ref 3.6–11.0)

## 2015-03-20 LAB — LIPASE, BLOOD: LIPASE: 53 U/L — AB (ref 22–51)

## 2015-03-20 LAB — FIBRIN DERIVATIVES D-DIMER (ARMC ONLY): FIBRIN DERIVATIVES D-DIMER (ARMC): 2350 — AB (ref 0–499)

## 2015-03-20 LAB — TROPONIN I: Troponin I: <0.03 ng/mL (ref <0.031)

## 2015-03-20 MED ORDER — IOHEXOL 350 MG/ML SOLN
100.0000 mL | Freq: Once | INTRAVENOUS | Status: AC | PRN
  Administered 2015-03-20: 100 mL via INTRAVENOUS

## 2015-03-20 MED ORDER — IOHEXOL 240 MG/ML SOLN
25.0000 mL | Freq: Once | INTRAMUSCULAR | Status: AC | PRN
  Administered 2015-03-20: 25 mL via ORAL

## 2015-03-20 MED ORDER — ONDANSETRON HCL 4 MG/2ML IJ SOLN
4.0000 mg | Freq: Once | INTRAMUSCULAR | Status: AC
Start: 2015-03-20 — End: 2015-03-20
  Administered 2015-03-20: 4 mg via INTRAVENOUS
  Filled 2015-03-20: qty 2

## 2015-03-20 MED ORDER — SODIUM CHLORIDE 0.9 % IV BOLUS (SEPSIS)
500.0000 mL | Freq: Once | INTRAVENOUS | Status: AC
  Administered 2015-03-20: 500 mL via INTRAVENOUS

## 2015-03-20 NOTE — ED Notes (Signed)
Called CT informed that pt finished PO contrast.

## 2015-03-20 NOTE — ED Provider Notes (Signed)
[facility] Emergency Department Provider Note  ____________________________________________  Time seen: Approximately 7:41 PM  I have reviewed the triage vital signs and the nursing notes.   HISTORY  Chief Complaint Nausea    HPI [name] is a 74 y.o. female with history of paroxysmal atrial fibrillation, hypertension and GERD who presents for evaluation of gradual onset lightheadedness today as well as nausea without vomiting, constant. At one point she reports she felt as if she might faint but she never fainted. She reports that while she was lightheaded and nauseated she had several minutes where she was very confused and had difficulty remembering things. She denies any numbness or weakness, no speech difficulties, no facial drooping.She had a partial knee replacement on the right earlier this month and reports that she has been recuperating. Her daughter at bedside reports that over the past month, since her operation, she has been evaluated for continued nausea as well as intermittent "memory problems" however today her symptoms were much more severe. She is prescribed to block her medications and previously was on Zofran however it made her "dizzy". She denies any chest pain, shortness of breath, abdominal pain, no modifying factors. Currently her symptoms are moderate.   Past Medical History  Diagnosis Date  . Atrial fibrillation   . Hypertension   . Chronic kidney disease     UTI  . GERD (gastroesophageal reflux disease)   . Arthritis     Patient Active Problem List   Diagnosis Date Noted  . 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 Tonsillectomy    . Breast enhancement surgery    . Eye surgery Bilateral     Cataract Extraction  . Cholecystectomy    . Partial knee arthroplasty Right 02/24/2015    Procedure: UNICOMPARTMENTAL KNEE;  Surgeon: Christena Flake, MD;  Location:  [facility] ORS;  Service: Orthopedics;  Laterality: Right;    Current Outpatient Rx  Name  Route  Sig  Dispense  Refill  . aspirin (BAYER ASPIRIN EC LOW DOSE) 81 MG EC tablet   Oral   Take 81 mg by mouth daily. Swallow whole.         . Biotin 5000 MCG CAPS   Oral   Take 1 capsule by mouth daily.         . bisacodyl (DULCOLAX) 5 MG EC tablet   Oral   Take 5 mg by mouth daily.         . Calcium Carb-Cholecalciferol (CALTRATE 600+D) 600-800 MG-UNIT TABS   Oral   Take 1 tablet by mouth daily.         . cholecalciferol (VITAMIN D) 1000 UNITS tablet   Oral   Take 1,000 Units by mouth daily.         . Cranberry 400 MG CAPS   Oral   Take 1 capsule by mouth daily.         Marland Kitchen enoxaparin (LOVENOX) 40 MG/0.4ML injection   Subcutaneous   Inject 0.4 mLs (40 mg total) into the skin daily. 1 everyday for 14 days.   14 Syringe   0   . estradiol (ESTRACE) 0.1 MG/GM vaginal cream   Vaginal   Place 1 Applicatorful vaginally daily.         . fluticasone (FLONASE) 50 MCG/ACT nasal spray   Each Nare   Place 1 spray into both nostrils daily.         Marland Kitchen  HYDROcodone-acetaminophen (NORCO/VICODIN) 5-325 MG per tablet   Oral   Take 1 tablet by mouth every 6 (six) hours as needed for moderate pain.         Marland Kitchen lisinopril-hydrochlorothiazide (PRINZIDE,ZESTORETIC) 20-12.5 MG per tablet   Oral   Take 1 tablet by mouth daily.         . Misc Natural Products (OSTEO BI-FLEX TRIPLE STRENGTH) TABS   Oral   Take 1 tablet by mouth 2 (two) times daily.         . Multiple Vitamins-Minerals (CENTRUM PO)   Oral   Take 1 tablet by mouth daily.         Marland Kitchen nystatin-triamcinolone (MYCOLOG II) cream   Topical   Apply 1 application topically as needed.         Marland Kitchen omeprazole (PRILOSEC) 20 MG capsule   Oral   Take 20 mg by mouth at bedtime.         . ondansetron (ZOFRAN) 4 MG tablet   Oral   Take 1 tablet (4 mg total) by mouth every 6 (six) hours as needed for nausea.   20 tablet    0   . oxyCODONE (OXY IR/ROXICODONE) 5 MG immediate release tablet   Oral   Take 1-2 tablets (5-10 mg total) by mouth every 4 (four) hours as needed for breakthrough pain.   60 tablet   0   . potassium chloride (MICRO-K) 10 MEQ CR capsule   Oral   Take 10 mEq by mouth daily.         . Probiotic Product (ALIGN) 4 MG CAPS   Oral   Take 1 capsule by mouth daily.         Marland Kitchen Propylene Glycol (SYSTANE BALANCE) 0.6 % SOLN   Ophthalmic   Apply 1 drop to eye 2 (two) times daily.         . psyllium (METAMUCIL) 58.6 % powder   Oral   Take 1 packet by mouth daily.         . ranitidine (ZANTAC) 150 MG tablet   Oral   Take 150 mg by mouth daily.         . simethicone (MYLICON) 80 MG chewable tablet   Oral   Chew 1 tablet (80 mg total) by mouth every 6 (six) hours as needed for flatulence.   30 tablet   0   . zolpidem (AMBIEN) 10 MG tablet   Oral   Take 10 mg by mouth at bedtime as needed for sleep.           Allergies Ceclor; Clarithromycin; Doxycycline; Macrobid; Meloxicam; and Prednisone  History reviewed. No pertinent family history.  Social History Social History  Substance Use Topics  . Smoking status: Former Smoker -- 1.00 packs/day    Types: Cigarettes    Quit date: 01/19/1999  . Smokeless tobacco: Never Used  . Alcohol Use: Yes     Comment: social    Review of Systems Constitutional: No fever/chills Eyes: No visual changes. ENT: No sore throat. Cardiovascular: Denies chest pain. Respiratory: Denies shortness of breath. Gastrointestinal: No abdominal pain.  + nausea, no vomiting.  No diarrhea.  No constipation. Genitourinary: Negative for dysuria. Musculoskeletal: Negative for back pain. Skin: Negative for rash. Neurological: Negative for headaches, focal weakness or numbness.  10-point ROS otherwise negative.  ____________________________________________   PHYSICAL EXAM:  VITAL SIGNS: ED Triage Vitals  Enc Vitals Group     BP 03/20/15  1919 148/62 mmHg  Pulse Rate 03/20/15 1919 75     Resp 03/20/15 1919 16     Temp --      Temp Source 03/20/15 1919 Oral     SpO2 03/20/15 1919 100 %     Weight 03/20/15 1919 165 lb (74.844 kg)     Height 03/20/15 1919  (1.626 m)     Head Cir --      Peak Flow --      Pain Score 03/20/15 1921 4     Pain Loc --      Pain Edu? --      Excl. in GC? --     Constitutional: Alert and oriented. Nontoxic-appearing and in no acute distress. Eyes: Conjunctivae are normal. PERRL. EOMI. Head: Atraumatic. Nose: No congestion/rhinnorhea. Mouth/Throat: Mucous membranes are moist.  Oropharynx non-erythematous. Neck: No stridor.   Cardiovascular: Normal rate, regular rhythm. Grossly normal heart sounds.  Good peripheral circulation. Respiratory: Normal respiratory effort.  No retractions. Lungs CTAB. Gastrointestinal: Soft with moderate tenderness to palpation in the right lower quadrant and the suprapubic region. No CVA tenderness. Genitourinary: deferred Musculoskeletal: Mild postoperative swelling of the right knee, no erythema, good range of motion, 2+ right DP pulse. Neurologic:  Normal speech and language. No gross focal neurologic deficits are appreciated.  Skin:  Skin is warm, dry and intact. No rash noted. Psychiatric: Mood and affect are normal. Speech and behavior are normal.  ____________________________________________   LABS (all labs ordered are listed, but only abnormal results are displayed)  Labs Reviewed  COMPREHENSIVE METABOLIC PANEL - Abnormal; Notable for the following:    Potassium 3.3 (*)    All other components within normal limits  LIPASE, BLOOD - Abnormal; Notable for the following:    Lipase 53 (*)    All other components within normal limits  URINALYSIS COMPLETEWITH MICROSCOPIC ([facility] ONLY) - Abnormal; Notable for the following:    Color, Urine STRAW (*)    APPearance CLEAR (*)    Squamous Epithelial / LPF 0-5 (*)    All other components within normal  limits  FIBRIN DERIVATIVES D-DIMER ([facility] ONLY) - Abnormal; Notable for the following:    Fibrin derivatives D-dimer ([facility]) 2350 (*)    All other components within normal limits  CBC WITH DIFFERENTIAL/PLATELET  TROPONIN I   ____________________________________________  EKG  ED ECG REPORT I, Gayla Doss, the attending physician, personally viewed and interpreted this ECG.   Date: 03/20/2015  EKG Time: 19:42  Rate: 61  Rhythm: normal sinus rhythm  Axis: normal  Intervals:none  ST&T Change: No acute ST elevation  ____________________________________________  RADIOLOGY  CT abdomen and pelvis, CTangio chest  IMPRESSION: 1. No evidence of pulmonary embolus. 2. Mild hazy bilateral atelectasis noted. Lungs otherwise clear. 3. Small bilateral renal cysts, more prominent on the right. 4. Scattered diverticulosis along the sigmoid colon, without evidence of diverticulitis. 5. Scattered calcification along the abdominal aorta and its branches. 6. Small bilateral inguinal hernias, containing only fat. 7. Minimal degenerative  ____________________________________________   PROCEDURES  Procedure(s) performed: None  Critical Care performed: No  ____________________________________________   INITIAL IMPRESSION / ASSESSMENT AND PLAN / ED COURSE  Pertinent labs & imaging results that were available during my care of the patient were reviewed by me and considered in my medical decision making (see chart for details).  [name] is a 74 y.o. female with history of paroxysmal atrial fibrillation, hypertension and GERD who presents for evaluation of gradual onset lightheadedness today as well as  nausea without vomiting. On exam, she is nontoxic appearing and in no acute distress. Vital signs stable, she is afebrile. She does have some tenderness to palpation throughout the right abdomen and the suprapubic region. She is alert and oriented 4 and has an intact neurological  exam. Her symptoms today do not sound consistent with acute CVA. Plan for screening labs, EKG, urinalysis will give antibiotics and light IV fluids. Reassess for need for advanced imaging and reassess for disposition.  ----------------------------------------- 12:15 AM on 03/21/2015 -----------------------------------------  D-dimer was elevated at 2300. CT angios chest was obtained to rule out PE, there is no evidence of PE. CT abdomen and pelvis obtained to evaluate the abdominal tenderness however there are no acute findings and her pain is resolved after urination. CBC, CMP, urinalysis are unremarkable. Troponin is negative. Lipase is mildly elevated at 53 but CT abdomen and pelvis shows no radio graphic evidence of acute pancreatitis. At this time, she is sitting up in bed, appears comfortable. She is tolerated all of her oral contrast for CT scan without vomiting. Doubt ACS or acute CVA but family is requesting a CT scan of her head given her intermittent confusion since her operation. CT head pending. Care transferred to Dr. Zenda Alpers pending CT head and final disposition. ____________________________________________   FINAL CLINICAL IMPRESSION(S) / ED DIAGNOSES  Final diagnoses:  Nausea  Lightheadedness  Confusion      Gayla Doss, MD 03/21/15 [phone]

## 2015-03-20 NOTE — ED Notes (Signed)
Pt had recent left knee surgery, was suppose to attend PT today was unable due to nausea w/o emesis

## 2015-03-21 ENCOUNTER — Emergency Department: Payer: Medicare Other

## 2015-03-21 DIAGNOSIS — N189 Chronic kidney disease, unspecified: Secondary | ICD-10-CM | POA: Diagnosis not present

## 2015-03-21 DIAGNOSIS — Z79899 Other long term (current) drug therapy: Secondary | ICD-10-CM | POA: Diagnosis not present

## 2015-03-21 DIAGNOSIS — Y831 Surgical operation with implant of artificial internal device as the cause of abnormal reaction of the patient, or of later complication, without mention of misadventure at the time of the procedure: Secondary | ICD-10-CM | POA: Diagnosis not present

## 2015-03-21 DIAGNOSIS — I129 Hypertensive chronic kidney disease with stage 1 through stage 4 chronic kidney disease, or unspecified chronic kidney disease: Secondary | ICD-10-CM | POA: Diagnosis not present

## 2015-03-21 DIAGNOSIS — R42 Dizziness and giddiness: Secondary | ICD-10-CM | POA: Diagnosis not present

## 2015-03-21 DIAGNOSIS — Z7982 Long term (current) use of aspirin: Secondary | ICD-10-CM | POA: Diagnosis not present

## 2015-03-21 DIAGNOSIS — R1031 Right lower quadrant pain: Secondary | ICD-10-CM | POA: Diagnosis not present

## 2015-03-21 DIAGNOSIS — Z87891 Personal history of nicotine dependence: Secondary | ICD-10-CM | POA: Diagnosis not present

## 2015-03-21 DIAGNOSIS — Z7901 Long term (current) use of anticoagulants: Secondary | ICD-10-CM | POA: Diagnosis not present

## 2015-03-21 DIAGNOSIS — R110 Nausea: Secondary | ICD-10-CM | POA: Diagnosis present

## 2015-03-21 DIAGNOSIS — R410 Disorientation, unspecified: Secondary | ICD-10-CM | POA: Diagnosis not present

## 2015-03-21 DIAGNOSIS — M9689 Other intraoperative and postprocedural complications and disorders of the musculoskeletal system: Secondary | ICD-10-CM | POA: Diagnosis not present

## 2015-03-21 MED ORDER — SUCRALFATE 1 G PO TABS: 1.0000 g | ORAL_TABLET | Freq: Two times a day (BID) | ORAL | Status: DC

## 2015-03-21 MED ORDER — METOCLOPRAMIDE HCL 10 MG PO TABS: 10.0000 mg | ORAL_TABLET | Freq: Three times a day (TID) | ORAL | Status: DC

## 2015-03-21 NOTE — Discharge Instructions (Signed)
Confusion Confusion is the inability to think with your usual speed or clarity. Confusion may come on quickly or slowly over time. How quickly the confusion comes on depends on the cause. Confusion can be due to any number of causes. CAUSES   Concussion, head injury, or head trauma.  Seizures.  Stroke.  Fever.  Brain tumor.  Age related decreased brain function (dementia).  Heightened emotional states like rage or terror.  Mental illness in which the person loses the ability to determine what is real and what is not (hallucinations).  Infections such as a urinary tract infection (UTI).  Toxic effects from alcohol, drugs, or prescription medicines.  Dehydration and an imbalance of salts in the body (electrolytes).  Lack of sleep.  Low blood sugar (diabetes).  Low levels of oxygen from conditions such as chronic lung disorders.  Drug interactions or other medicine side effects.  Nutritional deficiencies, especially niacin, thiamine, vitamin C, or vitamin B.  Sudden drop in body temperature (hypothermia).  Change in routine, such as when traveling or hospitalized. SIGNS AND SYMPTOMS  People often describe their thinking as cloudy or unclear when they are confused. Confusion can also include feeling disoriented. That means you are unaware of where or who you are. You may also not know what the date or time is. If confused, you may also have difficulty paying attention, remembering, and making decisions. Some people also act aggressively when they are confused.  DIAGNOSIS  The medical evaluation of confusion may include:  Blood and urine tests.  X-rays.  Brain and nervous system tests.  Analyzing your brain waves (electroencephalogram or EEG).  Magnetic resonance imaging (MRI) of your head.  Computed tomography (CT) scan of your head.  Mental status tests in which your health care provider may ask many questions. Some of these questions may seem silly or strange,  but they are a very important test to help diagnose and treat confusion. TREATMENT  An admission to the hospital may not be needed, but a person with confusion should not be left alone. Stay with a family member or friend until the confusion clears. Avoid alcohol, pain relievers, or sedative drugs until you have fully recovered. Do not drive until directed by your health care provider. HOME CARE INSTRUCTIONS  What family and friends can do:  To find out if someone is confused, ask the person to state his or her name, age, and the date. If the person is unsure or answers incorrectly, he or she is confused.  Always introduce yourself, no matter how well the person knows you.  Often remind the person of his or her location.  Place a calendar and clock near the confused person.  Help the person with his or her medicines. You may want to use a pill box, an alarm as a reminder, or give the person each dose as prescribed.  Talk about current events and plans for the day.  Try to keep the environment calm, quiet, and peaceful.  Make sure the person keeps follow-up visits with his or her health care provider. PREVENTION  Ways to prevent confusion:  Avoid alcohol.  Eat a balanced diet.  Get enough sleep.  Take medicine only as directed by your health care provider.  Do not become isolated. Spend time with other people and make plans for your days.  Keep careful watch on your blood sugar levels if you are diabetic. SEEK IMMEDIATE MEDICAL CARE IF:   You develop severe headaches, repeated vomiting, seizures, blackouts, or  slurred speech.  There is increasing confusion, weakness, numbness, restlessness, or personality changes.  You develop a loss of balance, have marked dizziness, feel uncoordinated, or fall.  You have delusions, hallucinations, or develop severe anxiety.  Your family members think you need to be rechecked. Document Released: 07/14/2004 Document Revised: 10/21/2013  Document Reviewed: 07/12/2013 [facility] Patient Information 2015 [place], [place]. This information is not intended to replace advice given to you by your health care provider. Make sure you discuss any questions you have with your health care provider.  Nausea, Adult Nausea is the feeling that you have an upset stomach or have to vomit. Nausea by itself is not likely a serious concern, but it may be an early sign of more serious medical problems. As nausea gets worse, it can lead to vomiting. If vomiting develops, there is the risk of dehydration.  CAUSES   Viral infections.  Food poisoning.  Medicines.  Pregnancy.  Motion sickness.  Migraine headaches.  Emotional distress.  Severe pain from any source.  Alcohol intoxication. HOME CARE INSTRUCTIONS  Get plenty of rest.  Ask your caregiver about specific rehydration instructions.  Eat small amounts of food and sip liquids more often.  Take all medicines as told by your caregiver. SEEK MEDICAL CARE IF:  You have not improved after 2 days, or you get worse.  You have a headache. SEEK IMMEDIATE MEDICAL CARE IF:   You have a fever.  You faint.  You keep vomiting or have blood in your vomit.  You are extremely weak or dehydrated.  You have dark or bloody stools.  You have severe chest or abdominal pain. MAKE SURE YOU:  Understand these instructions.  Will watch your condition.  Will get help right away if you are not doing well or get worse. Document Released: 07/14/2004 Document Revised: 02/29/2012 Document Reviewed: 02/16/2011 [facility] Patient Information 2015 [place], [place]. This information is not intended to replace advice given to you by your health care provider. Make sure you discuss any questions you have with your health care provider.

## 2015-03-21 NOTE — ED Provider Notes (Signed)
-----------------------------------------   1:40 AM on 03/21/2015 -----------------------------------------   Blood pressure 138/55, pulse 57, resp. rate 13, height  (1.626 m), weight 165 lb (74.844 kg), SpO2 100 %.  Assuming care from Dr. Inocencio Homes.  In short, [name] is a 74 y.o. female with a chief complaint of Nausea .  Refer to the original H&P for additional details.  The current plan of care is to follow-up the results of the CT head.  CT head: Negative  I did discuss the results with the patient and her daughter. I also informed him that the remainder of her DTs unremarkable. The patient's daughter reports that she is frustrated as she does not have any answers for her mother symptoms. The patient at this time reports that she is tired and she is hungry and is eating a sandwich. I informed the patient that she should follow-up with GI for her nausea as well as neurology for her episodes of confusion. I did inform her that this could be a result of her surgery but she does need to follow-up for further evaluation and testing. The family does understand and report that they will follow-up with the patient's primary care physician Dr. Hyacinth Meeker to determine what the next steps would be. The patient will be discharged home to follow-up with her primary care physician.   Rebecka Apley, MD 03/21/15 [phone]

## 2015-03-21 NOTE — ED Notes (Signed)
Provider at bedside talking with pt, Pt was given an sandwich tray

## 2016-03-28 ENCOUNTER — Emergency Department: Payer: Medicare Other

## 2016-03-28 ENCOUNTER — Emergency Department
Admission: EM | Admit: 2016-03-28 | Discharge: 2016-03-28 | Disposition: A | Discharge status: Home / Self Care | Payer: Medicare Other | Attending: Emergency Medicine | Admitting: Emergency Medicine

## 2016-03-28 ENCOUNTER — Encounter: Payer: Self-pay | Admitting: Emergency Medicine

## 2016-03-28 DIAGNOSIS — Z87891 Personal history of nicotine dependence: Secondary | ICD-10-CM | POA: Insufficient documentation

## 2016-03-28 DIAGNOSIS — I129 Hypertensive chronic kidney disease with stage 1 through stage 4 chronic kidney disease, or unspecified chronic kidney disease: Secondary | ICD-10-CM | POA: Insufficient documentation

## 2016-03-28 DIAGNOSIS — N189 Chronic kidney disease, unspecified: Secondary | ICD-10-CM | POA: Insufficient documentation

## 2016-03-28 DIAGNOSIS — I10 Essential (primary) hypertension: Secondary | ICD-10-CM

## 2016-03-28 DIAGNOSIS — R42 Dizziness and giddiness: Secondary | ICD-10-CM

## 2016-03-28 DIAGNOSIS — Z79899 Other long term (current) drug therapy: Secondary | ICD-10-CM | POA: Diagnosis not present

## 2016-03-28 LAB — URINALYSIS COMPLETE WITH MICROSCOPIC (ARMC ONLY)
BILIRUBIN URINE: NEGATIVE
Bacteria, UA: NONE SEEN
Glucose, UA: NEGATIVE mg/dL
HGB URINE DIPSTICK: NEGATIVE
KETONES UR: NEGATIVE mg/dL
LEUKOCYTES UA: NEGATIVE
NITRITE: NEGATIVE
PH: 6 (ref 5.0–8.0)
Protein, ur: NEGATIVE mg/dL
RBC / HPF: NONE SEEN RBC/hpf (ref 0–5)
SPECIFIC GRAVITY, URINE: 1.002 — AB (ref 1.005–1.030)

## 2016-03-28 LAB — CBC WITH DIFFERENTIAL/PLATELET
BASOS ABS: 0 10*3/uL (ref 0–0.1)
Basophils Relative: 1 %
EOS ABS: 0.1 10*3/uL (ref 0–0.7)
EOS PCT: 1 %
HCT: 38 % (ref 35.0–47.0)
HEMOGLOBIN: 13.3 g/dL (ref 12.0–16.0)
LYMPHS ABS: 1.6 10*3/uL (ref 1.0–3.6)
Lymphocytes Relative: 28 %
MCH: 28.1 pg (ref 26.0–34.0)
MCHC: 35 g/dL (ref 32.0–36.0)
MCV: 80.3 fL (ref 80.0–100.0)
Monocytes Absolute: 0.5 10*3/uL (ref 0.2–0.9)
Monocytes Relative: 9 %
Neutro Abs: 3.5 10*3/uL (ref 1.4–6.5)
Neutrophils Relative %: 61 %
PLATELETS: 179 10*3/uL (ref 150–440)
RBC: 4.74 MIL/uL (ref 3.80–5.20)
RDW: 15 % — ABNORMAL HIGH (ref 11.5–14.5)
WBC: 5.8 10*3/uL (ref 3.6–11.0)

## 2016-03-28 LAB — BASIC METABOLIC PANEL
ANION GAP: 7 (ref 5–15)
BUN: 10 mg/dL (ref 6–20)
CHLORIDE: 105 mmol/L (ref 101–111)
CO2: 25 mmol/L (ref 22–32)
Calcium: 9.7 mg/dL (ref 8.9–10.3)
Creatinine, Ser: 0.7 mg/dL (ref 0.44–1.00)
Glucose, Bld: 101 mg/dL — ABNORMAL HIGH (ref 65–99)
POTASSIUM: 3.7 mmol/L (ref 3.5–5.1)
SODIUM: 137 mmol/L (ref 135–145)

## 2016-03-28 LAB — MAGNESIUM: Magnesium: 1.8 mg/dL (ref 1.7–2.4)

## 2016-03-28 LAB — TROPONIN I

## 2016-03-28 NOTE — ED Triage Notes (Signed)
Pt arrived via ems from home with complaints of HTN. Pt was out dancing last night and starting to feel bad. Pt went home and took at potasium, thinking that low potassium was the cause of her not feeling well. After no relief pt checked her blood pressure and got a reading of 215/70/

## 2016-03-28 NOTE — ED Notes (Signed)
Additional warm blankets provided

## 2016-03-28 NOTE — ED Provider Notes (Signed)
[facility] Emergency Department Provider Note   ____________________________________________   First MD Initiated Contact with Patient 03/28/16 [phone]     (approximate)  I have reviewed the triage vital signs and the nursing notes.   HISTORY  Chief Complaint Hypertension   HPI [name] is a 75 y.o. female with a history of atrial fibrillation as well as hypertension who is presenting to the emergency department with hypertension. She says that she went dancing last night and then afterward at home at about 9 PM began to feel lightheaded. She checked her blood pressure every 6-8 minutes and found herself to be hypertensive with a maximum systolic pressure of 215. She took an extra dose of her lisinopril HCTZ combination pill. Denies any chest pain or shortness of breath at this time. Called EMS because of concern for her high blood pressure. Denies any nausea vomiting.   Past Medical History:  Diagnosis Date  . Arthritis   . Atrial fibrillation (HCC)   . Chronic kidney disease    UTI  . GERD (gastroesophageal reflux disease)   . Hypertension     Patient Active Problem List   Diagnosis Date Noted  . 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 BREAST ENHANCEMENT SURGERY    . CHOLECYSTECTOMY    . EYE SURGERY Bilateral    Cataract Extraction  . PARTIAL KNEE ARTHROPLASTY Right 02/24/2015   Procedure: UNICOMPARTMENTAL KNEE;  Surgeon: Christena Flake, MD;  Location: [facility] ORS;  Service: Orthopedics;  Laterality: Right;  . TONSILLECTOMY      Prior to Admission medications   Medication Sig Start Date End Date Taking? Authorizing Provider  aspirin (BAYER ASPIRIN EC LOW DOSE) 81 MG EC tablet Take 81 mg by mouth daily. Swallow whole.   Yes Historical Provider, MD  Biotin 5000 MCG CAPS Take 1 capsule by mouth daily.   Yes Historical Provider, MD  bisacodyl (DULCOLAX) 5 MG EC tablet Take  5 mg by mouth daily.   Yes Historical Provider, MD  Calcium Carb-Cholecalciferol (CALTRATE 600+D) 600-800 MG-UNIT TABS Take 1 tablet by mouth daily.   Yes Historical Provider, MD  Cranberry 400 MG CAPS Take 1 capsule by mouth daily.   Yes Historical Provider, MD  estradiol (ESTRACE) 0.1 MG/GM vaginal cream Place 1 Applicatorful vaginally daily.   Yes Historical Provider, MD  fluticasone (FLONASE) 50 MCG/ACT nasal spray Place 1 spray into both nostrils daily.   Yes Historical Provider, MD  lisinopril-hydrochlorothiazide (PRINZIDE,ZESTORETIC) 20-12.5 MG per tablet Take 1 tablet by mouth daily.   Yes Historical Provider, MD  Misc Natural Products (OSTEO BI-FLEX TRIPLE STRENGTH) TABS Take 1 tablet by mouth 2 (two) times daily.   Yes Historical Provider, MD  Multiple Vitamins-Minerals (CENTRUM PO) Take 1 tablet by mouth daily.   Yes Historical Provider, MD  omeprazole (PRILOSEC) 20 MG capsule Take 20 mg by mouth at bedtime.   Yes Historical Provider, MD  potassium chloride (MICRO-K) 10 MEQ CR capsule Take 10 mEq by mouth daily.   Yes Historical Provider, MD  Probiotic Product (ALIGN) 4 MG CAPS Take 1 capsule by mouth daily.   Yes Historical Provider, MD  Propylene Glycol (SYSTANE BALANCE) 0.6 % SOLN Apply 1 drop to eye 2 (two) times daily.   Yes Historical Provider, MD  ranitidine (ZANTAC) 150 MG tablet Take 150 mg by mouth daily.   Yes Historical Provider, MD  zolpidem (AMBIEN) 10 MG tablet Take 10  mg by mouth at bedtime as needed for sleep.   Yes Historical Provider, MD    Allergies Ceclor [cefaclor]; Clarithromycin; Doxycycline; Macrobid [nitrofurantoin]; Meloxicam; and Prednisone  History reviewed. No pertinent family history.  Social History Social History  Substance Use Topics  . Smoking status: Former Smoker    Packs/day: 1.00    Types: Cigarettes    Quit date: 01/19/1999  . Smokeless tobacco: Never Used  . Alcohol use Yes     Comment: social    Review of Systems Constitutional: No  fever/chills Eyes: No visual changes. ENT: No sore throat. Cardiovascular: Denies chest pain. Respiratory: Denies shortness of breath. Gastrointestinal: No abdominal pain.  No nausea, no vomiting.  No diarrhea.  No constipation. Genitourinary: Negative for dysuria. Musculoskeletal: Negative for back pain. Skin: Negative for rash. Neurological: Negative for headaches, focal weakness or numbness.  10-point ROS otherwise negative.  ____________________________________________   PHYSICAL EXAM:  VITAL SIGNS: ED Triage Vitals  Enc Vitals Group     BP 03/28/16 0401 (!) 174/59     Pulse Rate 03/28/16 0401 62     Resp 03/28/16 0401 16     Temp 03/28/16 0401 98.5 F (36.9 C)     Temp Source 03/28/16 0401 Oral     SpO2 03/28/16 0401 98 %     Weight 03/28/16 0404 161 lb (73 kg)     Height 03/28/16 0404 5\' 4"  (1.626 m)     Head Circumference --      Peak Flow --      Pain Score --      Pain Loc --      Pain Edu? --      Excl. in GC? --     Constitutional: Alert and oriented. Well appearing and in no acute distress. Eyes: Conjunctivae are normal. PERRL. EOMI. Head: Atraumatic. Nose: No congestion/rhinnorhea. Mouth/Throat: Mucous membranes are moist.  Neck: No stridor.   Cardiovascular: Normal rate, regular rhythm. Grossly normal heart sounds.  Respiratory: Normal respiratory effort.  No retractions. Lungs CTAB. Gastrointestinal: Soft and nontender. No distention.  Musculoskeletal: No lower extremity tenderness nor edema.  No joint effusions. Neurologic:  Normal speech and language. No gross focal neurologic deficits are appreciated. Skin:  Skin is warm, dry and intact. No rash noted. Psychiatric: Mood and affect are normal. Speech and behavior are normal.  ____________________________________________   LABS (all labs ordered are listed, but only abnormal results are displayed)  Labs Reviewed  CBC WITH DIFFERENTIAL/PLATELET - Abnormal; Notable for the following:        Result Value   RDW 15.0 (*)    All other components within normal limits  BASIC METABOLIC PANEL - Abnormal; Notable for the following:    Glucose, Bld 101 (*)    All other components within normal limits  URINALYSIS COMPLETEWITH MICROSCOPIC ([facility] ONLY) - Abnormal; Notable for the following:    Color, Urine COLORLESS (*)    APPearance CLEAR (*)    Specific Gravity, Urine 1.002 (*)    Squamous Epithelial / LPF 0-5 (*)    All other components within normal limits  TROPONIN I  MAGNESIUM   ____________________________________________  EKG  ED ECG REPORT I, Arelia LongestSchaevitz,  Derry Kassel M, the attending physician, personally viewed and interpreted this ECG.   Date: 03/28/2016  EKG Time: 0356   Rate: 68  Rhythm: normal sinus rhythm with intermittent bigeminy.  Axis: Normal  Intervals:none  ST&T Change: No ST segment elevation or depression. No abnormal T-wave inversion.  ____________________________________________  RADIOLOGY  DG Chest 2 View (Accession [id number]) (Order [id number])  Imaging  Date: 03/28/2016 Department: [facility] EMERGENCY DEPARTMENT Released By/Authorizing: Myrna Blazer, MD (auto-released)  Exam Information   Status Exam Begun  Exam Ended   Final [99] 03/28/2016 4:13 AM 03/28/2016 4:19 AM  PACS Images   Show images for DG Chest 2 View  Study Result   CLINICAL DATA:  New syncopal event  EXAM: CHEST  2 VIEW  COMPARISON:  Chest CT 03/20/2015  FINDINGS: Cardiomediastinal contours are normal. No pneumothorax or pleural effusion.  No focal airspace consolidation or pulmonary edema.  IMPRESSION: Clear lungs.   Electronically Signed   By: Deatra Robinson M.D.   On: 03/28/2016 04:25     ____________________________________________   PROCEDURES  Procedure(s) performed:   Procedures  Critical Care performed:   ____________________________________________   INITIAL IMPRESSION / ASSESSMENT AND PLAN / ED  COURSE  Pertinent labs & imaging results that were available during my care of the patient were reviewed by me and considered in my medical decision making (see chart for details).  ----------------------------------------- 6:22 AM on 03/28/2016 -----------------------------------------  He is without any complaints at this time. Latest blood pressure is 126/61. I discussed with her making sure to keep taking lots of her blood pressures and to follow-up with her primary care doctor, Dr. Hyacinth Meeker. She has had very reassuring labs as well as imaging. Possible lightheadedness secondary to hypertension but without any headache or other pain such as chest pain. I feel that she is appropriate for discharge to home at this time. She had ectopy on her EKG earlier tonight but no ectopy on the monitor at this time and reassuring electrolytes. She understands the plan for discharge and to not take her morning dose of lisinopril HCTZ this morning. We discussed resuming it tomorrow morning as normal. She is understanding of the plan and willing to comply.  Clinical Course     ____________________________________________   FINAL CLINICAL IMPRESSION(S) / ED DIAGNOSES  Hypertension. Lightheadedness.    NEW MEDICATIONS STARTED DURING THIS VISIT:  New Prescriptions   No medications on file     Note:  This document was prepared using Dragon voice recognition software and may include unintentional dictation errors.    Myrna Blazer, MD 03/28/16 [phone]

## 2016-11-24 ENCOUNTER — Emergency Department
Admission: EM | Admit: 2016-11-24 | Discharge: 2016-11-24 | Disposition: A | Discharge status: Home / Self Care | Payer: Medicare Other | Attending: Emergency Medicine | Admitting: Emergency Medicine

## 2016-11-24 ENCOUNTER — Encounter: Payer: Self-pay | Admitting: *Deleted

## 2016-11-24 DIAGNOSIS — Z87891 Personal history of nicotine dependence: Secondary | ICD-10-CM | POA: Insufficient documentation

## 2016-11-24 DIAGNOSIS — L0231 Cutaneous abscess of buttock: Secondary | ICD-10-CM | POA: Diagnosis not present

## 2016-11-24 DIAGNOSIS — I129 Hypertensive chronic kidney disease with stage 1 through stage 4 chronic kidney disease, or unspecified chronic kidney disease: Secondary | ICD-10-CM | POA: Diagnosis not present

## 2016-11-24 DIAGNOSIS — Z9049 Acquired absence of other specified parts of digestive tract: Secondary | ICD-10-CM | POA: Insufficient documentation

## 2016-11-24 DIAGNOSIS — N189 Chronic kidney disease, unspecified: Secondary | ICD-10-CM | POA: Insufficient documentation

## 2016-11-24 DIAGNOSIS — Z96651 Presence of right artificial knee joint: Secondary | ICD-10-CM | POA: Insufficient documentation

## 2016-11-24 LAB — CBC WITH DIFFERENTIAL/PLATELET
Basophils Absolute: 0 10*3/uL (ref 0–0.1)
Basophils Relative: 0 %
EOS ABS: 0 10*3/uL (ref 0–0.7)
EOS PCT: 0 %
HCT: 37.8 % (ref 35.0–47.0)
Hemoglobin: 13 g/dL (ref 12.0–16.0)
LYMPHS PCT: 7 %
Lymphs Abs: 0.8 10*3/uL — ABNORMAL LOW (ref 1.0–3.6)
MCH: 28 pg (ref 26.0–34.0)
MCHC: 34.3 g/dL (ref 32.0–36.0)
MCV: 81.8 fL (ref 80.0–100.0)
Monocytes Absolute: 0.7 10*3/uL (ref 0.2–0.9)
Monocytes Relative: 7 %
Neutro Abs: 9.3 10*3/uL — ABNORMAL HIGH (ref 1.4–6.5)
Neutrophils Relative %: 86 %
PLATELETS: 208 10*3/uL (ref 150–440)
RBC: 4.63 MIL/uL (ref 3.80–5.20)
RDW: 15.2 % — ABNORMAL HIGH (ref 11.5–14.5)
WBC: 10.9 10*3/uL (ref 3.6–11.0)

## 2016-11-24 LAB — COMPREHENSIVE METABOLIC PANEL
ALT: 18 U/L (ref 14–54)
ANION GAP: 7 (ref 5–15)
AST: 29 U/L (ref 15–41)
Albumin: 3.6 g/dL (ref 3.5–5.0)
Alkaline Phosphatase: 59 U/L (ref 38–126)
BILIRUBIN TOTAL: 0.7 mg/dL (ref 0.3–1.2)
BUN: 17 mg/dL (ref 6–20)
CO2: 23 mmol/L (ref 22–32)
Calcium: 9.8 mg/dL (ref 8.9–10.3)
Chloride: 107 mmol/L (ref 101–111)
Creatinine, Ser: 1.03 mg/dL — ABNORMAL HIGH (ref 0.44–1.00)
GFR calc Af Amer: >60 mL/min (ref >60)
GFR, EST NON AFRICAN AMERICAN: 52 mL/min — AB (ref >60)
Glucose, Bld: 146 mg/dL — ABNORMAL HIGH (ref 65–99)
POTASSIUM: 3.5 mmol/L (ref 3.5–5.1)
Sodium: 137 mmol/L (ref 135–145)
TOTAL PROTEIN: 7.1 g/dL (ref 6.5–8.1)

## 2016-11-24 LAB — LACTIC ACID, PLASMA: Lactic Acid, Venous: 1.4 mmol/L (ref 0.5–1.9)

## 2016-11-24 MED ORDER — LIDOCAINE HCL (PF) 1 % IJ SOLN
5.0000 mL | Freq: Once | INTRAMUSCULAR | Status: AC
  Administered 2016-11-24: 5 mL
  Filled 2016-11-24: qty 5

## 2016-11-24 MED ORDER — VANCOMYCIN HCL IN DEXTROSE 1-5 GM/200ML-% IV SOLN
1000.0000 mg | Freq: Once | INTRAVENOUS | Status: AC
  Administered 2016-11-24: 1000 mg via INTRAVENOUS
  Filled 2016-11-24: qty 200

## 2016-11-24 MED ORDER — KETOROLAC TROMETHAMINE 30 MG/ML IJ SOLN
15.0000 mg | Freq: Once | INTRAMUSCULAR | Status: AC
  Administered 2016-11-24: 15 mg via INTRAVENOUS
  Filled 2016-11-24: qty 1

## 2016-11-24 MED ORDER — MORPHINE SULFATE (PF) 4 MG/ML IV SOLN: 4.0000 mg | Freq: Once | INTRAVENOUS | Status: DC

## 2016-11-24 MED ORDER — LIDOCAINE HCL (PF) 1 % IJ SOLN
INTRAMUSCULAR | Status: AC
  Filled 2016-11-24: qty 10

## 2016-11-24 MED ORDER — MUPIROCIN 2 % EX OINT: TOPICAL_OINTMENT | CUTANEOUS | 0 refills | Status: AC

## 2016-11-24 NOTE — ED Provider Notes (Signed)
[facility] Emergency Department Provider Note       Time seen: ----------------------------------------- 5:47 PM on 11/24/2016 -----------------------------------------     I have reviewed the triage vital signs and the nursing notes.   HISTORY   Chief Complaint Abscess    HPI Larry SierrasMartha M Hohn is a 76 y.o. female who presents to the ED for an abscess to the back of her right thigh. 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 She also reported a rash in her right arm which she was treating as poison ivy. She reports low-grade fevers for the past 2 nights with significant drainage from the right thigh. She denies chills, severe pain or other complaints.   Past Medical History:  Diagnosis Date  . Arthritis   . Atrial fibrillation (HCC)   . Chronic kidney disease    UTI  . GERD (gastroesophageal reflux disease)   . Hypertension     Patient Active Problem List   Diagnosis Date Noted  . 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 BREAST ENHANCEMENT SURGERY    . CHOLECYSTECTOMY    . EYE SURGERY Bilateral    Cataract Extraction  . PARTIAL KNEE ARTHROPLASTY Right 02/24/2015   Procedure: UNICOMPARTMENTAL KNEE;  Surgeon: Christena FlakeJohn J Poggi, MD;  Location: [facility] ORS;  Service: Orthopedics;  Laterality: Right;  . TONSILLECTOMY      Allergies Ceclor [cefaclor]; Clarithromycin; Doxycycline; Macrobid [nitrofurantoin]; Meloxicam; and Prednisone  Social History Social History  Substance Use Topics  . Smoking status: Former Smoker    Packs/day: 1.00    Types: Cigarettes    Quit date: 01/19/1999  . Smokeless tobacco: Never Used  . Alcohol use Yes     Comment: social    Review of Systems Constitutional: Negative for fever. Cardiovascular: Negative for chest pain. Respiratory: Negative for shortness of breath. Gastrointestinal: Negative for abdominal pain, vomiting  and diarrhea. Musculoskeletal: Negative for back pain. Skin: Positive for draining wound on the right thigh Neurological: Negative for headaches, focal weakness or numbness.  All systems negative/normal/unremarkable except as stated in the HPI  ____________________________________________   PHYSICAL EXAM:  VITAL SIGNS: ED Triage Vitals  Enc Vitals Group     BP 11/24/16 1530 (!) 142/62     Pulse Rate 11/24/16 1530 94     Resp 11/24/16 1530 16     Temp 11/24/16 1530 99 F (37.2 C)     Temp Source 11/24/16 1530 Oral     SpO2 11/24/16 1530 96 %     Weight 11/24/16 1535 160 lb (72.6 kg)     Height 11/24/16 1535 5\' 4"  (1.626 m)     Head Circumference --      Peak Flow --      Pain Score 11/24/16 1530 5     Pain Loc --      Pain Edu? --      Excl. in GC? --     Constitutional: Alert and oriented. Well appearing and in no distress. Eyes: Conjunctivae are normal. Normal extraocular movements. Cardiovascular: Normal rate, regular rhythm. No murmurs, rubs, or gallops. Respiratory: Normal respiratory effort without tachypnea nor retractions. Breath sounds are clear and equal bilaterally. No wheezes/rales/rhonchi. Gastrointestinal: Soft and nontender. Normal bowel sounds Musculoskeletal: Tenderness noted to the Right inferior gluteal area Neurologic:  Normal speech and language. No gross focal neurologic deficits are appreciated.  Skin:  Abscess with surrounding erythema and purulent drainage noted  centrally in the right inferior gluteal region Psychiatric: Mood and affect are normal. Speech and behavior are normal.  ___________________________________________  ED COURSE:  Pertinent labs & imaging results that were available during my care of the patient were reviewed by me and considered in my medical decision making (see chart for details). Patient presents for skin abscess, we will assess with labs as indicated.   Marland Kitchen.Incision and Drainage Date/Time: 11/24/2016 6:23 PM Performed  by: Emily Filbert Authorized by: Daryel November E   Consent:    Consent obtained:  Verbal Location:    Type:  Abscess   Location:  Lower extremity   Lower extremity location:  Buttock   Buttock location:  R buttock Pre-procedure details:    Skin preparation:  Betadine Anesthesia (see MAR for exact dosages):    Anesthesia method:  Local infiltration   Local anesthetic:  Lidocaine 1% w/o epi Procedure type:    Complexity:  Complex Procedure details:    Incision types:  Single straight   Incision depth:  Subcutaneous   Scalpel blade:  11   Wound management:  Probed and deloculated, irrigated with saline and extensive cleaning   Drainage:  Purulent   Drainage amount:  Moderate   Wound treatment:  Drain placed   Packing materials:  1/4 in gauze Post-procedure details:    Patient tolerance of procedure:  Tolerated well, no immediate complications   ____________________________________________   LABS (pertinent positives/negatives)  Labs Reviewed  COMPREHENSIVE METABOLIC PANEL - Abnormal; Notable for the following:       Result Value   Glucose, Bld 146 (*)    Creatinine, Ser 1.03 (*)    GFR calc non Af Amer 52 (*)    All other components within normal limits  CBC WITH DIFFERENTIAL/PLATELET - Abnormal; Notable for the following:    RDW 15.2 (*)    Neutro Abs 9.3 (*)    Lymphs Abs 0.8 (*)    All other components within normal limits  AEROBIC/ANAEROBIC CULTURE (SURGICAL/DEEP WOUND)  LACTIC ACID, PLASMA   ____________________________________________  FINAL ASSESSMENT AND PLAN  Abscess  Plan: Patient's labs were dictated above. Patient had presented for a right gluteal abscess that has undergone incision and drainage. She did receive IV antibiotics here. I will encourage sitz bath, topical antibiotic ointment and dressing changes. She is stable for outpatient follow-up.    Emily Filbert, MD   Note: This note was generated in part or whole with  voice recognition software. Voice recognition is usually quite accurate but there are transcription errors that can and very often do occur. I apologize for any typographical errors that were not detected and corrected.     Emily Filbert, MD 11/24/16 [phone]

## 2016-11-24 NOTE — ED Notes (Signed)

## 2016-11-24 NOTE — ED Triage Notes (Signed)
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 PT reports she had a rash on right arm that has since subsided but reports with two separate antibiotics PO and two separate antibacterial creams the area has worsened. Pt reports having had low grade fevers for the past 2 nights.   Wound has large red area surrounding the open wound. Wound is draining yellow and blood tingled drainage. New dressing applied.

## 2016-11-24 NOTE — ED Notes (Signed)
Pt's wound cleaned and bandaged by this RN; blue paper scrubs provide for the trip home; pt escorted to ED lobby in a wheel chair to be picked up by friends.

## 2016-11-24 NOTE — ED Notes (Signed)
Patient with large abscess noted to right buttock. Scabbed head noted with purulent yellow, bloody drainage noted.

## 2016-11-25 ENCOUNTER — Encounter: Payer: Medicare Other | Attending: Surgery | Admitting: Surgery

## 2016-11-25 DIAGNOSIS — Z9049 Acquired absence of other specified parts of digestive tract: Secondary | ICD-10-CM | POA: Diagnosis not present

## 2016-11-25 DIAGNOSIS — I129 Hypertensive chronic kidney disease with stage 1 through stage 4 chronic kidney disease, or unspecified chronic kidney disease: Secondary | ICD-10-CM | POA: Diagnosis not present

## 2016-11-25 DIAGNOSIS — J449 Chronic obstructive pulmonary disease, unspecified: Secondary | ICD-10-CM | POA: Diagnosis not present

## 2016-11-25 DIAGNOSIS — Z96651 Presence of right artificial knee joint: Secondary | ICD-10-CM | POA: Insufficient documentation

## 2016-11-25 DIAGNOSIS — N189 Chronic kidney disease, unspecified: Secondary | ICD-10-CM | POA: Diagnosis not present

## 2016-11-25 DIAGNOSIS — S71101A Unspecified open wound, right thigh, initial encounter: Secondary | ICD-10-CM | POA: Diagnosis not present

## 2016-11-25 DIAGNOSIS — I482 Chronic atrial fibrillation: Secondary | ICD-10-CM | POA: Insufficient documentation

## 2016-11-25 DIAGNOSIS — Z79899 Other long term (current) drug therapy: Secondary | ICD-10-CM | POA: Diagnosis not present

## 2016-11-25 DIAGNOSIS — L02415 Cutaneous abscess of right lower limb: Secondary | ICD-10-CM | POA: Diagnosis not present

## 2016-11-25 DIAGNOSIS — X58XXXA Exposure to other specified factors, initial encounter: Secondary | ICD-10-CM | POA: Diagnosis not present

## 2016-11-25 DIAGNOSIS — Z87891 Personal history of nicotine dependence: Secondary | ICD-10-CM | POA: Insufficient documentation

## 2016-11-25 DIAGNOSIS — M1990 Unspecified osteoarthritis, unspecified site: Secondary | ICD-10-CM | POA: Diagnosis not present

## 2016-11-26 NOTE — Progress Notes (Addendum)
TRISTYN, PHARRIS ([id number]) Visit Report for 11/25/2016 Allergy List Details Patient Name: [name], [name]. Date of Service: 11/25/2016 1:30 PM Medical Record Number: [id number] Patient Account Number: [id number] Date of Birth/Sex: August 28, 1940 (76 y.o. Female) Treating RN: Huel Coventry Primary Care Akeema Broder: Bethann Punches Other Clinician: Referring Sheryl Saintil: Bethann Punches Treating Yamili Lichtenwalner/Extender: Rudene Re in Treatment: 0 Allergies Active Allergies Ceclor clarithromycin doxycycline Macrobid meloxicam prednisone Allergy Notes Electronic Signature(Garcia) Signed: 11/25/2016 8:36:17 AM By: Elliot Gurney, BSN, RN, CWS, Kim RN, BSN Entered By: Elliot Gurney, BSN, RN, CWS, Kim on 11/25/2016 08:36:17 Standifer, Kelby Aline ([id number]) -------------------------------------------------------------------------------- Arrival Information Details Patient Name: [name]. Date of Service: 11/25/2016 1:30 PM Medical Record Number: [id number] Patient Account Number: [id number] Date of Birth/Sex: 26-Mar-1941 (76 y.o. Female) Treating RN: Huel Coventry Primary Care Tayton Decaire: Bethann Punches Other Clinician: Referring Bryndon Cumbie: Bethann Punches Treating Berkley Wrightsman/Extender: Rudene Re in Treatment: 0 Visit Information Patient Arrived: Ambulatory Arrival Time: 13:34 Accompanied By: self Transfer Assistance: None Patient Identification Verified: Yes Secondary Verification Process Yes Completed: Patient Has Alerts: Yes Patient Alerts: Patient on Blood Thinner aspirin 81 mg Electronic Signature(Garcia) Signed: 11/25/2016 4:43:58 PM By: Elliot Gurney, BSN, RN, CWS, Kim RN, BSN Entered By: Elliot Gurney, BSN, RN, CWS, Kim on 11/25/2016 13:36:39 Barish, Kelby Aline ([id number]) -------------------------------------------------------------------------------- Clinic Level of Care Assessment Details Patient Name: [name]. Date of Service: 11/25/2016 1:30 PM Medical Record Number: [id number] Patient Account Number:  [id number] Date of Birth/Sex: [birth year] (76 y.o. Female) Treating RN: Huel Coventry Primary Care Jame Morrell: Bethann Punches Other Clinician: Referring Enrika Aguado: Bethann Punches Treating Romy Ipock/Extender: Rudene Re in Treatment: 0 Clinic Level of Care Assessment Items TOOL 4 Quantity Score []  - Use when only an EandM is performed on FOLLOW-UP visit 0 ASSESSMENTS - Nursing Assessment / Reassessment []  - Reassessment of Co-morbidities (includes updates in patient status) 0 X - Reassessment of Adherence to Treatment Plan 1 5 ASSESSMENTS - Wound and Skin Assessment / Reassessment X - Simple Wound Assessment / Reassessment - one wound 1 5 []  - Complex Wound Assessment / Reassessment - multiple wounds 0 []  - Dermatologic / Skin Assessment (not related to wound area) 0 ASSESSMENTS - Focused Assessment []  - Circumferential Edema Measurements - multi extremities 0 []  - Nutritional Assessment / Counseling / Intervention 0 []  - Lower Extremity Assessment (monofilament, tuning fork, pulses) 0 []  - Peripheral Arterial Disease Assessment (using hand held doppler) 0 ASSESSMENTS - Ostomy and/or Continence Assessment and Care []  - Incontinence Assessment and Management 0 []  - Ostomy Care Assessment and Management (repouching, etc.) 0 PROCESS - Coordination of Care X - Simple Patient / Family Education for ongoing care 1 15 []  - Complex (extensive) Patient / Family Education for ongoing care 0 X - Staff obtains Chiropractor, Records, Test Results / Process Orders 1 10 []  - Staff telephones HHA, Nursing Homes / Clarify orders / etc 0 []  - Routine Transfer to another Facility (non-emergent condition) 0 Mesina, Camron M. ([id number]) []  - Routine Hospital Admission (non-emergent condition) 0 []  - New Admissions / Manufacturing engineer / Ordering NPWT, Apligraf, etc. 0 []  - Emergency Hospital Admission (emergent condition) 0 X - Simple Discharge Coordination 1 10 []  - Complex (extensive) Discharge  Coordination 0 PROCESS - Special Needs []  - Pediatric / Minor Patient Management 0 []  - Isolation Patient Management 0 []  - Hearing / Language / Visual special needs 0 []  - Assessment of Community assistance (transportation, D/C planning, etc.) 0 []  - Additional assistance / Altered mentation 0 []  - Support Surface(Garcia) Assessment (bed,  cushion, seat, etc.) 0 INTERVENTIONS - Wound Cleansing / Measurement X - Simple Wound Cleansing - one wound 1 5 []  - Complex Wound Cleansing - multiple wounds 0 X - Wound Imaging (photographs - any number of wounds) 1 5 []  - Wound Tracing (instead of photographs) 0 X - Simple Wound Measurement - one wound 1 5 []  - Complex Wound Measurement - multiple wounds 0 INTERVENTIONS - Wound Dressings X - Small Wound Dressing one or multiple wounds 1 10 []  - Medium Wound Dressing one or multiple wounds 0 []  - Large Wound Dressing one or multiple wounds 0 []  - Application of Medications - topical 0 []  - Application of Medications - injection 0 INTERVENTIONS - Miscellaneous []  - External ear exam 0 Dobkins, Bryant M. ([id number]) []  - Specimen Collection (cultures, biopsies, blood, body fluids, etc.) 0 []  - Specimen(Garcia) / Culture(Garcia) sent or taken to Lab for analysis 0 []  - Patient Transfer (multiple staff / Michiel Sites Lift / Similar devices) 0 []  - Simple Staple / Suture removal (25 or less) 0 []  - Complex Staple / Suture removal (26 or more) 0 []  - Hypo / Hyperglycemic Management (close monitor of Blood Glucose) 0 []  - Ankle / Brachial Index (ABI) - do not check if billed separately 0 X - Vital Signs 1 5 Has the patient been seen at the hospital within the last three years: Yes Total Score: 75 Level Of Care: New/Established - Level 2 Electronic Signature(Garcia) Signed: 11/25/2016 4:43:58 PM By: Elliot Gurney, BSN, RN, CWS, Kim RN, BSN Entered By: Elliot Gurney, BSN, RN, CWS, Kim on 11/25/2016 14:42:03 Summerson, Kelby Aline  ([id number]) -------------------------------------------------------------------------------- Encounter Discharge Information Details Patient Name: [name]. Date of Service: 11/25/2016 1:30 PM Medical Record Number: [id number] Patient Account Number: [id number] Date of Birth/Sex: June 01, 1941 (76 y.o. Female) Treating RN: Huel Coventry Primary Care Tyeesha Riker: Bethann Punches Other Clinician: Referring Bosco Paparella: Bethann Punches Treating Jaxzen Vanhorn/Extender: Rudene Re in Treatment: 0 Encounter Discharge Information Items Discharge Pain Level: 0 Discharge Condition: Stable Ambulatory Status: Ambulatory Discharge Destination: Home Private Transportation: Auto Accompanied By: self Schedule Follow-up Appointment: Yes Medication Reconciliation completed and Yes provided to Patient/Care Brandon Wiechman: Clinical Summary of Care: Electronic Signature(Garcia) Signed: 11/25/2016 4:43:58 PM By: Elliot Gurney, BSN, RN, CWS, Kim RN, BSN Entered By: Elliot Gurney, BSN, RN, CWS, Kim on 11/25/2016 14:57:13 Sinning, Kelby Aline ([id number]) -------------------------------------------------------------------------------- Lower Extremity Assessment Details Patient Name: NAYELIZ, HIPP. Date of Service: 11/25/2016 1:30 PM Medical Record Number: [id number] Patient Account Number: [id number] Date of Birth/Sex: 02-02-1941 (76 y.o. Female) Treating RN: Huel Coventry Primary Care Amarria Andreasen: Bethann Punches Other Clinician: Referring Jaivian Battaglini: Bethann Punches Treating Kase Shughart/Extender: Rudene Re in Treatment: 0 Electronic Signature(Garcia) Signed: 11/25/2016 4:43:58 PM By: Elliot Gurney, BSN, RN, CWS, Kim RN, BSN Entered By: Elliot Gurney, BSN, RN, CWS, Kim on 11/25/2016 13:48:45 Hennigan, Kelby Aline ([id number]) -------------------------------------------------------------------------------- Multi Wound Chart Details Patient Name: DESARIE, FEILD. Date of Service: 11/25/2016 1:30 PM Medical Record Number: [id number] Patient Account Number:  [id number] Date of Birth/Sex: [birth year] (76 y.o. Female) Treating RN: Huel Coventry Primary Care Retta Pitcher: Bethann Punches Other Clinician: Referring Malynda Smolinski: Bethann Punches Treating Veleria Barnhardt/Extender: Rudene Re in Treatment: 0 Vital Signs Height(in): 64 Pulse(bpm): 75 Weight(lbs): Blood Pressure 104/66 (mmHg): Body Mass Index(BMI): Temperature(F): 98.1 Respiratory Rate 16 (breaths/min): Photos: [N/A:N/A] Wound Location: Right Upper Leg N/A N/A Wounding Event: Bump N/A N/A Primary Etiology: Abscess N/A N/A Comorbid History: Cataracts, Hypertension, N/A N/A Osteoarthritis Date Acquired: 11/20/2016 N/A N/A Weeks of Treatment: 0 N/A N/A Wound Status: Open N/A N/A Measurements L  x W x D 2x3.5x1 N/A N/A (cm) Area (cm) : 5.498 N/A N/A Volume (cm) : 5.498 N/A N/A % Reduction in Area: 0.00% N/A N/A % Reduction in Volume: 0.00% N/A N/A Classification: Full Thickness Without N/A N/A Exposed Support Structures Exudate Amount: Large N/A N/A Exudate Type: Serosanguineous N/A N/A Exudate Color: red, brown N/A N/A Wound Margin: Indistinct, nonvisible N/A N/A Granulation Amount: None Present (0%) N/A N/A Necrotic Amount: Large (67-100%) N/A N/A Exposed Structures: N/A N/A Theilen, Kelby AlineMARTHA M. ([id number]) Fat Layer (Subcutaneous Tissue) Exposed: Yes Fascia: No Tendon: No Muscle: No Joint: No Bone: No Epithelialization: None N/A N/A Periwound Skin Texture: Induration: Yes N/A N/A Excoriation: No Callus: No Crepitus: No Rash: No Scarring: No Periwound Skin Maceration: No N/A N/A Moisture: Dry/Scaly: No Periwound Skin Color: Ecchymosis: Yes N/A N/A Atrophie Blanche: No Cyanosis: No Erythema: No Hemosiderin Staining: No Mottled: No Pallor: No Rubor: No Tenderness on No N/A N/A Palpation: Wound Preparation: Ulcer Cleansing: N/A N/A Rinsed/Irrigated with Saline Topical Anesthetic Applied: Other: lidocaine 4% Treatment Notes Electronic  Signature(Garcia) Signed: 11/25/2016 4:43:58 PM By: Elliot GurneyWoody, BSN, RN, CWS, Kim RN, BSN Previous Signature: 11/25/2016 2:28:06 PM Version By: Evlyn KannerBritto, Errol MD, FACS Entered By: Elliot GurneyWoody, BSN, RN, CWS, Kim on 11/25/2016 14:37:55 Munch, Kelby AlineMARTHA M. ([id number]) -------------------------------------------------------------------------------- Multi-Disciplinary Care Plan Details Patient Name: Maureen SierrasSHOFFNER, Hessie M. Date of Service: 11/25/2016 1:30 PM Medical Record Number: [id number] Patient Account Number: [id number] Date of Birth/Sex: 01/12/1941 (76 y.o. Female) Treating RN: Huel CoventryWoody, Kim Primary Care Deyani Hegarty: Bethann PunchesMiller, Mark Other Clinician: Referring Luddie Boghosian: Bethann PunchesMiller, Mark Treating Lynnlee Revels/Extender: Rudene ReBritto, Errol Weeks in Treatment: 0 Active Inactive ` Abuse / Safety / Falls / Self Care Management Nursing Diagnoses: Self care deficit: actual or potential Goals: Patient/caregiver will verbalize/demonstrate measure taken to improve self care Date Initiated: 11/25/2016 Target Resolution Date: 12/16/2016 Goal Status: Active Interventions: Assess self care needs on admission and as needed Treatment Activities: Patient referred to home care : 11/25/2016 Notes: ` Necrotic Tissue Nursing Diagnoses: Impaired tissue integrity related to necrotic/devitalized tissue Goals: Patient/caregiver will verbalize understanding of reason and process for debridement of necrotic tissue Date Initiated: 11/25/2016 Target Resolution Date: 12/12/2016 Goal Status: Active Interventions: Assess patient pain level pre-, during and post procedure and prior to discharge Treatment Activities: Apply topical anesthetic as ordered : 11/25/2016 Notes: [name]` Hey, Nikiyah M. ([id number]) Orientation to the Wound Care Program Nursing Diagnoses: Knowledge deficit related to the wound healing center program Goals: Patient/caregiver will verbalize understanding of the Wound Healing Center Program Date Initiated: 11/25/2016 Target Resolution  Date: 12/12/2016 Goal Status: Active Interventions: Provide education on orientation to the wound center Notes: ` Wound/Skin Impairment Nursing Diagnoses: Impaired tissue integrity Goals: Ulcer/skin breakdown will heal within 14 weeks Date Initiated: 11/25/2016 Target Resolution Date: 12/12/2016 Goal Status: Active Interventions: Assess ulceration(Garcia) every visit Treatment Activities: Topical wound management initiated : 11/25/2016 Notes: Electronic Signature(Garcia) Signed: 11/25/2016 4:43:58 PM By: Elliot GurneyWoody, BSN, RN, CWS, Kim RN, BSN Entered By: Elliot GurneyWoody, BSN, RN, CWS, Kim on 11/25/2016 14:37:29 Beckel, Kelby AlineMARTHA M. ([id number]) -------------------------------------------------------------------------------- Pain Assessment Details Patient Name: Maureen SierrasSHOFFNER, Sary M. Date of Service: 11/25/2016 1:30 PM Medical Record Number: [id number] Patient Account Number: [id number] Date of Birth/Sex: 11/19/1940 (76 y.o. Female) Treating RN: Huel CoventryWoody, Kim Primary Care Ladasia Sircy: Bethann PunchesMiller, Mark Other Clinician: Referring Keyli Duross: Bethann PunchesMiller, Mark Treating Zeppelin Beckstrand/Extender: Rudene ReBritto, Errol Weeks in Treatment: 0 Active Problems Location of Pain Severity and Description of Pain Patient Has Paino No Site Locations With Dressing Change: No Pain Management and Medication Current Pain Management: Goals for Pain Management Topical or  injectable lidocaine is offered to patient for acute pain when surgical debridement is performed. If needed, Patient is instructed to use over the counter pain medication for the following 24-48 hours after debridement. Wound care MDs do not prescribed pain medications. Patient has chronic pain or uncontrolled pain. Patient has been instructed to make an appointment with their Primary Care Physician for pain management. Electronic Signature(Garcia) Signed: 11/25/2016 4:43:58 PM By: Elliot Gurney, BSN, RN, CWS, Kim RN, BSN Entered By: Elliot Gurney, BSN, RN, CWS, Kim on 11/25/2016 13:36:57 Bartolome, Kelby Aline  ([id number]) -------------------------------------------------------------------------------- Patient/Caregiver Education Details Patient Name: RELLA, EGELSTON. Date of Service: 11/25/2016 1:30 PM Medical Record Number: [id number] Patient Account Number: [id number] Date of Birth/Gender: [birth year] (76 y.o. Female) Treating RN: Huel Coventry Primary Care Physician: Bethann Punches Other Clinician: Referring Physician: Bethann Punches Treating Physician/Extender: Rudene Re in Treatment: 0 Education Assessment Education Provided To: Patient Education Topics Provided Wound/Skin Impairment: Handouts: Caring for Your Ulcer, Other: wound care as prescribed Methods: Demonstration Responses: State content correctly Electronic Signature(Garcia) Signed: 11/25/2016 4:43:58 PM By: Elliot Gurney, BSN, RN, CWS, Kim RN, BSN Entered By: Elliot Gurney, BSN, RN, CWS, Kim on 11/25/2016 14:57:34 Dressel, Kelby Aline ([id number]) -------------------------------------------------------------------------------- Wound Assessment Details Patient Name: PORSCHIA, WILLBANKS. Date of Service: 11/25/2016 1:30 PM Medical Record Number: [id number] Patient Account Number: [id number] Date of Birth/Sex: Feb 12, 1941 (76 y.o. Female) Treating RN: Huel Coventry Primary Care Kase Shughart: Bethann Punches Other Clinician: Referring Kyria Bumgardner: Bethann Punches Treating Winter Trefz/Extender: Rudene Re in Treatment: 0 Wound Status Wound Number: 1 Primary Abscess Etiology: Wound Location: Right Upper Leg Wound Status: Open Wounding Event: Bump Comorbid Cataracts, Hypertension, Date Acquired: 11/20/2016 History: Osteoarthritis Weeks Of Treatment: 0 Clustered Wound: No Photos Wound Measurements Length: (cm) 2 Width: (cm) 3.5 Depth: (cm) 1 Area: (cm) 5.498 Volume: (cm) 5.498 % Reduction in Area: 0% % Reduction in Volume: 0% Epithelialization: None Tunneling: No Undermining: No Wound Description Full Thickness Without  Exposed Classification: Support Structures Wound Margin: Indistinct, nonvisible Exudate Large Amount: Exudate Type: Serosanguineous Exudate Color: red, brown Foul Odor After Cleansing: No Slough/Fibrino Yes Wound Bed Granulation Amount: None Present (0%) Exposed Structure Necrotic Amount: Large (67-100%) Fascia Exposed: No Necrotic Quality: Adherent Slough Fat Layer (Subcutaneous Tissue) Exposed: Yes Tendon Exposed: No Muscle Exposed: No Joint Exposed: No Acebo, Decie M. ([id number]) Bone Exposed: No Periwound Skin Texture Texture Color No Abnormalities Noted: No No Abnormalities Noted: No Callus: No Atrophie Blanche: No Crepitus: No Cyanosis: No Excoriation: No Ecchymosis: Yes Induration: Yes Erythema: No Rash: No Hemosiderin Staining: No Scarring: No Mottled: No Pallor: No Moisture Rubor: No No Abnormalities Noted: No Dry / Scaly: No Maceration: No Wound Preparation Ulcer Cleansing: Rinsed/Irrigated with Saline Topical Anesthetic Applied: Other: lidocaine 4%, Treatment Notes Wound #1 (Right Upper Leg) 1. Cleansed with: Clean wound with Normal Saline 2. Anesthetic Topical Lidocaine 4% cream to wound bed prior to debridement 4. Dressing Applied: Iodoform packing Gauze 5. Secondary Dressing Applied Bordered Foam Dressing Electronic Signature(Garcia) Signed: 11/25/2016 4:43:58 PM By: Elliot Gurney, BSN, RN, CWS, Kim RN, BSN Entered By: Elliot Gurney, BSN, RN, CWS, Kim on 11/25/2016 13:56:19 Shaler, Kelby Aline ([id number]) -------------------------------------------------------------------------------- Vitals Details Patient Name: [name]. Date of Service: 11/25/2016 1:30 PM Medical Record Number: [id number] Patient Account Number: [id number] Date of Birth/Sex: [birth year] (76 y.o. Female) Treating RN: Huel Coventry Primary Care Nickolaos Brallier: Bethann Punches Other Clinician: Referring Deshanti Adcox: Bethann Punches Treating Alajah Witman/Extender: Rudene Re in Treatment:  0 Vital Signs Time Taken: 13:37 Temperature (F): 98.1 Height (in): 64 Pulse (bpm): 75 Respiratory Rate (breaths/min):  16 Blood Pressure (mmHg): 104/66 Reference Range: 80 - 120 mg / dl Electronic Signature(Garcia) Signed: 11/25/2016 4:43:58 PM By: Elliot Gurney, BSN, RN, CWS, Kim RN, BSN Entered By: Elliot Gurney, BSN, RN, CWS, Kim on 11/25/2016 13:37:49

## 2016-11-27 NOTE — Progress Notes (Signed)
ABRAR, KOONE ([id number]) Visit Report for 11/25/2016 Chief Complaint Document Details Patient Name: [name], [name]. Date of Service: 11/25/2016 1:30 PM Medical Record Number: [id number] Patient Account Number: [id number] Date of Birth/Sex: 1940-12-07 (76 y.o. Female) Treating RN: Huel Coventry Primary Care Provider: Bethann Punches Other Clinician: Referring Provider: Bethann Punches Treating Provider/Extender: Rudene Re in Treatment: 0 Information Obtained from: Patient Chief Complaint Patient presents to the wound care center for a consult due non healing wound to the right upper thigh posteriorly just below her gluteal fold which she's had for about a week Electronic Signature(s) Signed: 11/25/2016 2:28:33 PM By: Evlyn Kanner MD, FACS Entered By: Evlyn Kanner on 11/25/2016 14:28:33 Bixby, [name] ([id number]) -------------------------------------------------------------------------------- HPI Details Patient Name: [name]. Date of Service: 11/25/2016 1:30 PM Medical Record Number: [id number] Patient Account Number: [id number] Date of Birth/Sex: 1941/05/08 (76 y.o. Female) Treating RN: Huel Coventry Primary Care Provider: Bethann Punches Other Clinician: Referring Provider: Bethann Punches Treating Provider/Extender: Rudene Re in Treatment: 0 History of Present Illness Location: recent IandD for a indurated abscess on the right gluteal fold and upper thigh Quality: Patient reports experiencing a sharp pain to affected area(s). Severity: Patient states wound are getting worse. Duration: Patient has had the wound for < 1 week prior to presenting for treatment Timing: Pain in wound is constant (hurts all the time) Context: The wound would happen gradually Modifying Factors: Other treatment(s) tried include:antibiotics given to her by her PCP and IandD done in the ER Associated Signs and Symptoms: Patient reports having increase discharge. HPI Description:  76 year old patient who was seen in the emergency department for abscess on the back of her right thigh. She had an abscess with erythema and purulent drainage. This was drained under local anesthesia and packed with 1/4 inch gauze. she received IV antibiotics -- vancomycin, while she was there Past medical history of arthritis, atrial fibrillation chronic kidney disease and status post right partial knee replacement. He has also had a cholecystectomy, eye surgery and tonsillectomy. She is a former smoker quit in 2000 Psychologist, prison and probation services) Signed: 11/25/2016 2:29:58 PM By: Evlyn Kanner MD, FACS Previous Signature: 11/25/2016 1:52:04 PM Version By: Evlyn Kanner MD, FACS Previous Signature: 11/25/2016 1:48:27 PM Version By: Evlyn Kanner MD, FACS Entered By: Evlyn Kanner on 11/25/2016 14:29:58 Newburn, [name] ([id number]) -------------------------------------------------------------------------------- Physical Exam Details Patient Name: [name], [name]. Date of Service: 11/25/2016 1:30 PM Medical Record Number: [id number] Patient Account Number: [id number] Date of Birth/Sex: 02-Nov-1940 (76 y.o. Female) Treating RN: Huel Coventry Primary Care Provider: Bethann Punches Other Clinician: Referring Provider: Bethann Punches Treating Provider/Extender: Rudene Re in Treatment: 0 Constitutional . Pulse regular. Respirations normal and unlabored. Afebrile. . Eyes Nonicteric. Reactive to light. Ears, Nose, Mouth, and Throat Lips, teeth, and gums WNL.Marland Kitchen Moist mucosa without lesions. Neck supple and nontender. No palpable supraclavicular or cervical adenopathy. Normal sized without goiter. Respiratory WNL. No retractions.. Cardiovascular Pedal Pulses WNL. No clubbing, cyanosis or edema. Gastrointestinal (GI) Abdomen without masses or tenderness.. No liver or spleen enlargement or tenderness.. Lymphatic No adneopathy. No adenopathy. No adenopathy. Musculoskeletal Adexa without tenderness  or enlargement.. Digits and nails w/o clubbing, cyanosis, infection, petechiae, ischemia, or inflammatory conditions.. Integumentary (Hair, Skin) No suspicious lesions. No crepitus or fluctuance. No peri-wound warmth or erythema. No masses.Marland Kitchen Psychiatric Judgement and insight Intact.. No evidence of depression, anxiety, or agitation.. Notes A large indurated area on the right posterior thigh just near her gluteal fold. IandD has been done and there is a depth  to the wound. No fluctuation. No further debridement required Electronic Signature(s) Signed: 11/25/2016 2:30:47 PM By: Evlyn KannerBritto, Clide Remmers MD, FACS Entered By: Evlyn KannerBritto, Maecie Sevcik on 11/25/2016 14:30:46 Treu, Maureen AlineMARTHA M. ([id number]) -------------------------------------------------------------------------------- Physician Orders Details Patient Name: Maureen SierrasSHOFFNER, [name]. Date of Service: 11/25/2016 1:30 PM Medical Record Number: [id number] Patient Account Number: [id number] Date of Birth/Sex: 08/17/1940 (76 y.o. Female) Treating RN: Huel CoventryWoody, Kim Primary Care Provider: Bethann PunchesMiller, Mark Other Clinician: Referring Provider: Bethann PunchesMiller, Mark Treating Provider/Extender: Rudene ReBritto, Acelyn Basham Weeks in Treatment: 0 Verbal / Phone Orders: No Diagnosis Coding ICD-10 Coding Code Description S71.101A Unspecified open wound, right thigh, initial encounter L02.415 Cutaneous abscess of right lower limb Wound Cleansing Wound #1 Right Upper Leg o Clean wound with Normal Saline. Anesthetic Wound #1 Right Upper Leg o Topical Lidocaine 4% cream applied to wound bed prior to debridement Primary Wound Dressing Wound #1 Right Upper Leg o Iodoform packing Gauze Secondary Dressing Wound #1 Right Upper Leg o Boardered Foam Dressing Dressing Change Frequency Wound #1 Right Upper Leg o Change dressing every day. - Sunday, Tuesday, Thursday by Home Health Nurse; Friday in the Wound Care Center Follow-up Appointments Wound #1 Right Upper Leg o Return Appointment  in 1 week. Home Health Wound #1 Right Upper Leg o Initiate Home Health for Skilled Nursing - Sunday, Tuesday, Thursday by Home Health Nurse; [facility] o Continue Home Health Visits Maureen SierrasSHOFFNER, [name]. ([id number]) o Home Health Nurse may visit PRN to address patientos wound care needs. o FACE TO FACE ENCOUNTER: MEDICARE and MEDICAID PATIENTS: I certify that this patient is under my care and that I had a face-to-face encounter that meets the physician face-to-face encounter requirements with this patient on this date. The encounter with the patient was in whole or in part for the following MEDICAL CONDITION: (primary reason for Home Healthcare) MEDICAL NECESSITY: I certify, that based on my findings, NURSING services are a medically necessary home health service. HOME BOUND STATUS: I certify that my clinical findings support that this patient is homebound (i.e., Due to illness or injury, pt requires aid of supportive devices such as crutches, cane, wheelchairs, walkers, the use of special transportation or the assistance of another person to leave their place of residence. There is a normal inability to leave the home and doing so requires considerable and taxing effort. Other absences are for medical reasons / religious services and are infrequent or of short duration when for other reasons). o If current dressing causes regression in wound condition, may D/C ordered dressing product/s and apply Normal Saline Moist Dressing daily until next Wound Healing Center / Other MD appointment. Notify Wound Healing Center of regression in wound condition at [phone]. o Please direct any NON-WOUND related issues/requests for orders to patient's Primary Care Physician Electronic Signature(s) Signed: 11/25/2016 4:12:58 PM By: Evlyn KannerBritto, Jasmine Maceachern MD, FACS Signed: 11/25/2016 4:43:58 PM By: Elliot GurneyWoody, BSN, RN, CWS, Kim RN, BSN Entered By: Elliot GurneyWoody, BSN, RN, CWS, Kim on 11/25/2016  14:40:48 Dehne, Maureen AlineMARTHA M. ([id number]) -------------------------------------------------------------------------------- Problem List Details Patient Name: Maureen SierrasSHOFFNER, Aubrianne M. Date of Service: 11/25/2016 1:30 PM Medical Record Number: [id number] Patient Account Number: [id number] Date of Birth/Sex: 11/09/1940 (76 y.o. Female) Treating RN: Huel CoventryWoody, Kim Primary Care Provider: Bethann PunchesMiller, Mark Other Clinician: Referring Provider: Bethann PunchesMiller, Mark Treating Provider/Extender: Rudene ReBritto, Bathsheba Durrett Weeks in Treatment: 0 Active Problems ICD-10 Encounter Code Description Active Date Diagnosis S71.101A Unspecified open wound, right thigh, initial encounter 11/25/2016 Yes L02.415 Cutaneous abscess of right lower limb 11/25/2016 Yes Inactive Problems Resolved Problems Electronic Signature(s) Signed:  11/25/2016 2:28:00 PM By: Evlyn Kanner MD, FACS Entered By: Evlyn Kanner on 11/25/2016 14:28:00 Emory, [name] ([id number]) -------------------------------------------------------------------------------- Progress Note Details Patient Name: [name], [name]. Date of Service: 11/25/2016 1:30 PM Medical Record Number: [id number] Patient Account Number: [id number] Date of Birth/Sex: 06-26-1940 (76 y.o. Female) Treating RN: Huel Coventry Primary Care Provider: Bethann Punches Other Clinician: Referring Provider: Bethann Punches Treating Provider/Extender: Rudene Re in Treatment: 0 Subjective Chief Complaint Information obtained from Patient Patient presents to the wound care center for a consult due non healing wound to the right upper thigh posteriorly just below her gluteal fold which she's had for about a week History of Present Illness (HPI) The following HPI elements were documented for the patient's wound: Location: recent IandD for a indurated abscess on the right gluteal fold and upper thigh Quality: Patient reports experiencing a sharp pain to affected area(s). Severity: Patient states wound are  getting worse. Duration: Patient has had the wound for < 1 week prior to presenting for treatment Timing: Pain in wound is constant (hurts all the time) Context: The wound would happen gradually Modifying Factors: Other treatment(s) tried include:antibiotics given to her by her PCP and IandD done in the ER Associated Signs and Symptoms: Patient reports having increase discharge. 76 year old patient who was seen in the emergency department for abscess on the back of her right thigh. She had an abscess with erythema and purulent drainage. This was drained under local anesthesia and packed with 1/4 inch gauze. she received IV antibiotics -- vancomycin, while she was there Past medical history of arthritis, atrial fibrillation chronic kidney disease and status post right partial knee replacement. He has also had a cholecystectomy, eye surgery and tonsillectomy. She is a former smoker quit in 2000 Wound History Patient presents with 1 open wound that has been present for approximately 1 week. Patient has been treating wound in the following manner: mirpirosen. Laboratory tests have not been performed in the last month. Patient reportedly has not tested positive for an antibiotic resistant organism. Patient History Information obtained from Patient, Chart. Allergies Ceclor, clarithromycin, doxycycline, Macrobid, meloxicam, prednisone Social History MORRISSA, SHEIN ([id number]) Former smoker - 2000, Marital Status - Divorced, Alcohol Use - Moderate, Drug Use - No History, Caffeine Use - Never. Medical History Eyes Patient has history of Cataracts - surgery Denies history of Glaucoma, Optic Neuritis Ear/Nose/Mouth/Throat Denies history of Chronic sinus problems/congestion, Middle ear problems Hematologic/Lymphatic Denies history of Anemia, Hemophilia, Human Immunodeficiency Virus, Lymphedema, Sickle Cell Disease Respiratory Denies history of Aspiration, Asthma, Chronic Obstructive  Pulmonary Disease (COPD), Pneumothorax, Sleep Apnea, Tuberculosis Cardiovascular Patient has history of Hypertension Denies history of Angina, Arrhythmia, Congestive Heart Failure, Coronary Artery Disease, Deep Vein Thrombosis, Hypotension, Myocardial Infarction, Peripheral Arterial Disease, Peripheral Venous Disease, Vasculitis Gastrointestinal Denies history of Cirrhosis , Colitis, Crohn s, Hepatitis A, Hepatitis B, Hepatitis C Endocrine Denies history of Type I Diabetes, Type II Diabetes Genitourinary Denies history of End Stage Renal Disease Immunological Denies history of Lupus Erythematosus, Raynaud s, Scleroderma Integumentary (Skin) Denies history of History of Burn, History of pressure wounds Musculoskeletal Patient has history of Osteoarthritis Denies history of Gout, Rheumatoid Arthritis, Osteomyelitis Neurologic Denies history of Dementia, Neuropathy, Quadriplegia, Paraplegia, Seizure Disorder Oncologic Denies history of Received Chemotherapy, Received Radiation Psychiatric Denies history of Anorexia/bulimia, Confinement Anxiety Medical And Surgical History Notes Constitutional Symptoms (General Health) HTN Review of Systems (ROS) Constitutional Symptoms (General Health) Complains or has symptoms of Fever, Chills. Denies complaints or symptoms of Fatigue, Marked Weight Change.  Eyes The patient has no complaints or symptoms. Ear/Nose/Mouth/Throat The patient has no complaints or symptoms. JONEL, SICK ([id number]) Hematologic/Lymphatic The patient has no complaints or symptoms. Respiratory The patient has no complaints or symptoms. Cardiovascular The patient has no complaints or symptoms. Gastrointestinal The patient has no complaints or symptoms. Endocrine The patient has no complaints or symptoms. Genitourinary The patient has no complaints or symptoms. Immunological The patient has no complaints or symptoms. Integumentary (Skin) Complains or  has symptoms of Wounds, Bleeding or bruising tendency. Denies complaints or symptoms of Breakdown, Swelling. Musculoskeletal The patient has no complaints or symptoms. Neurologic The patient has no complaints or symptoms. Oncologic The patient has no complaints or symptoms. Psychiatric The patient has no complaints or symptoms. Medications lisinopril 20 mg-hydrochlorothiazide 12.5 mg tablet oral tablet oral Align 4 mg capsule oral capsule oral Osteo Bi-Flex Triple Strength 750 mg-644 mg-30 mg-1 mg tablet oral tablet oral Systane Balance 0.6 % eye drops ophthalmic (eye) drops ophthalmic (eye) Caltrate 600 + D 600 mg (1,500 mg)-800 unit chewable tablet oral tablet,chewable oral cranberry 400 mg capsule oral capsule oral ranitidine 150 mg capsule oral capsule oral bisacodyl 5 mg tablet oral tablet oral Centrum 18 mg-400 mcg tablet oral tablet oral Flonase Allergy Relief 50 mcg/actuation nasal spray,suspension nasal spray,suspension nasal Adult Low Dose Aspirin 81 mg tablet,delayed release oral tablet,delayed release (DR/EC) oral potassium chloride ER 10 mEq capsule,extended release oral capsule, extended release oral omeprazole 20 mg capsule,delayed release oral capsule,delayed release(DR/EC) oral Ambien 10 mg tablet oral tablet oral mupirocin 2 % topical ointment topical ointment topical mupirocin 2 % topical ointment topical ointment topical Estrace 0.01% (0.1 mg/gram) vaginal cream vaginal cream vaginal biotin 5,000 mcg disintegrating tablet oral tablet,disintegrating oral Gritton, Tiare M. ([id number]) Objective Constitutional Pulse regular. Respirations normal and unlabored. Afebrile. Vitals Time Taken: 1:37 PM, Height: 64 in, Temperature: 98.1 F, Pulse: 75 bpm, Respiratory Rate: 16 breaths/min, Blood Pressure: 104/66 mmHg. Eyes Nonicteric. Reactive to light. Ears, Nose, Mouth, and Throat Lips, teeth, and gums WNL.Marland Kitchen Moist mucosa without lesions. Neck supple and nontender.  No palpable supraclavicular or cervical adenopathy. Normal sized without goiter. Respiratory WNL. No retractions.. Cardiovascular Pedal Pulses WNL. No clubbing, cyanosis or edema. Gastrointestinal (GI) Abdomen without masses or tenderness.. No liver or spleen enlargement or tenderness.. Lymphatic No adneopathy. No adenopathy. No adenopathy. Musculoskeletal Adexa without tenderness or enlargement.. Digits and nails w/o clubbing, cyanosis, infection, petechiae, ischemia, or inflammatory conditions.Marland Kitchen Psychiatric Judgement and insight Intact.. No evidence of depression, anxiety, or agitation.. General Notes: A large indurated area on the right posterior thigh just near her gluteal fold. IandD has been done and there is a depth to the wound. No fluctuation. No further debridement required Integumentary (Hair, Skin) No suspicious lesions. No crepitus or fluctuance. No peri-wound warmth or erythema. No masses.. Wound #1 status is Open. Original cause of wound was Bump. The wound is located on the Right Upper Leg. The wound measures 2cm length x 3.5cm width x 1cm depth; 5.498cm^2 area and 5.498cm^3 volume. There is Fat Layer (Subcutaneous Tissue) Exposed exposed. There is no tunneling or undermining noted. Larcom, Neytiri M. ([id number]) There is a large amount of serosanguineous drainage noted. The wound margin is indistinct and nonvisible. There is no granulation within the wound bed. There is a large (67-100%) amount of necrotic tissue within the wound bed including Adherent Slough. The periwound skin appearance exhibited: Induration, Ecchymosis. The periwound skin appearance did not exhibit: Callus, Crepitus, Excoriation, Rash, Scarring, Dry/Scaly, Maceration, Atrophie Blanche, Cyanosis, Hemosiderin  Staining, Mottled, Pallor, Rubor, Erythema. Assessment Active Problems ICD-10 S71.101A - Unspecified open wound, right thigh, initial encounter L02.415 - Cutaneous abscess of right lower  limb This 76 year old patient was recently seen in the ER for an IandD of a right gluteal fold and upper thigh abscess which has been drained. She is already on oral antibiotics as per her PCP. After review recommended: 1. Packing the wound with 1/4 inch iodoform gauze and applying a bordered foam over this daily. 2. Continue with the oral antibiotics as per her PCP 3. See her back in the wound clinic in a week. The patient ulcers she has no family support and it is difficult for her to do her dressing so we will try and get her home health if her insurance does cover this. I have asked her to work with the family and friends for support during this dressing change. Plan Wound Cleansing: Wound #1 Right Upper Leg: Clean wound with Normal Saline. Anesthetic: Wound #1 Right Upper Leg: Topical Lidocaine 4% cream applied to wound bed prior to debridement Primary Wound Dressing: Wound #1 Right Upper Leg: Iodoform packing Gauze Secondary Dressing: Wound #1 Right Upper Leg: Boardered Foam Dressing MONALISA, BAYLESS ([id number]) Dressing Change Frequency: Wound #1 Right Upper Leg: Change dressing every day. - Sunday, Tuesday, Thursday by Home Health Nurse; Friday in the Wound Care Center Follow-up Appointments: Wound #1 Right Upper Leg: Return Appointment in 1 week. Home Health: Wound #1 Right Upper Leg: Initiate Home Health for Skilled Nursing - Sunday, Tuesday, Thursday by Home Health Nurse; Friday in the Wound Care Center Continue Home Health Visits Home Health Nurse may visit PRN to address patient s wound care needs. FACE TO FACE ENCOUNTER: MEDICARE and MEDICAID PATIENTS: I certify that this patient is under my care and that I had a face-to-face encounter that meets the physician face-to-face encounter requirements with this patient on this date. The encounter with the patient was in whole or in part for the following MEDICAL CONDITION: (primary reason for Home Healthcare)  MEDICAL NECESSITY: I certify, that based on my findings, NURSING services are a medically necessary home health service. HOME BOUND STATUS: I certify that my clinical findings support that this patient is homebound (i.e., Due to illness or injury, pt requires aid of supportive devices such as crutches, cane, wheelchairs, walkers, the use of special transportation or the assistance of another person to leave their place of residence. There is a normal inability to leave the home and doing so requires considerable and taxing effort. Other absences are for medical reasons / religious services and are infrequent or of short duration when for other reasons). If current dressing causes regression in wound condition, may D/C ordered dressing product/s and apply Normal Saline Moist Dressing daily until next Wound Healing Center / Other MD appointment. Notify Wound Healing Center of regression in wound condition at [phone]. Please direct any NON-WOUND related issues/requests for orders to patient's Primary Care Physician This 76 year old patient was recently seen in the ER for an IandD of a right gluteal fold and upper thigh abscess which has been drained. She is already on oral antibiotics as per her PCP. After review recommended: 1. Packing the wound with 1/4 inch iodoform gauze and applying a bordered foam over this daily. 2. Continue with the oral antibiotics as per her PCP 3. See her back in the wound clinic in a week. The patient ulcers she has no family support and it is difficult for her to do her  dressing so we will try and get her home health if her insurance does cover this. I have asked her to work with the family and friends for support during this dressing change. Electronic Signature(s) Signed: 11/25/2016 4:16:00 PM By: Evlyn Kanner MD, FACS Previous Signature: 11/25/2016 2:33:34 PM Version By: Evlyn Kanner MD, FACS Krammes, [name] ([id number]) Entered By: Evlyn Kanner on  11/25/2016 16:16:00 [name], [name] ([id number]) -------------------------------------------------------------------------------- ROS/PFSH Details Patient Name: [name], [name]. Date of Service: 11/25/2016 1:30 PM Medical Record Number: [id number] Patient Account Number: [id number] Date of Birth/Sex: 1941/02/22 (76 y.o. Female) Treating RN: Huel Coventry Primary Care Provider: Bethann Punches Other Clinician: Referring Provider: Bethann Punches Treating Provider/Extender: Rudene Re in Treatment: 0 Information Obtained From Patient Chart Wound History Do you currently have one or more open woundso Yes How many open wounds do you currently haveo 1 Approximately how long have you had your woundso 1 week How have you been treating your wound(s) until nowo mirpirosen Has your wound(s) ever healed and then re-openedo No Have you had any lab work done in the past montho No Have you tested positive for an antibiotic resistant organism (MRSA, VRE)o No Constitutional Symptoms (General Health) Complaints and Symptoms: Positive for: Fever; Chills Negative for: Fatigue; Marked Weight Change Medical History: Past Medical History Notes: HTN Integumentary (Skin) Complaints and Symptoms: Positive for: Wounds; Bleeding or bruising tendency Negative for: Breakdown; Swelling Medical History: Negative for: History of Burn; History of pressure wounds Musculoskeletal Complaints and Symptoms: No Complaints or Symptoms Complaints and Symptoms: Negative for: Muscle Pain; Muscle Weakness Medical History: Positive for: Osteoarthritis Negative for: Gout; Rheumatoid Arthritis; Osteomyelitis Churchman, Christain M. ([id number]) Eyes Complaints and Symptoms: No Complaints or Symptoms Medical History: Positive for: Cataracts - surgery Negative for: Glaucoma; Optic Neuritis Ear/Nose/Mouth/Throat Complaints and Symptoms: No Complaints or Symptoms Medical History: Negative for: Chronic sinus  problems/congestion; Middle ear problems Hematologic/Lymphatic Complaints and Symptoms: No Complaints or Symptoms Medical History: Negative for: Anemia; Hemophilia; Human Immunodeficiency Virus; Lymphedema; Sickle Cell Disease Respiratory Complaints and Symptoms: No Complaints or Symptoms Medical History: Negative for: Aspiration; Asthma; Chronic Obstructive Pulmonary Disease (COPD); Pneumothorax; Sleep Apnea; Tuberculosis Cardiovascular Complaints and Symptoms: No Complaints or Symptoms Medical History: Positive for: Hypertension Negative for: Angina; Arrhythmia; Congestive Heart Failure; Coronary Artery Disease; Deep Vein Thrombosis; Hypotension; Myocardial Infarction; Peripheral Arterial Disease; Peripheral Venous Disease; Vasculitis Gastrointestinal Complaints and Symptoms: No Complaints or Symptoms Medical History: Negative for: Cirrhosis ; Colitis; Crohnos; Hepatitis A; Hepatitis B; Hepatitis C Shannahan, Davanee M. ([id number]) Endocrine Complaints and Symptoms: No Complaints or Symptoms Medical History: Negative for: Type I Diabetes; Type II Diabetes Genitourinary Complaints and Symptoms: No Complaints or Symptoms Medical History: Negative for: End Stage Renal Disease Immunological Complaints and Symptoms: No Complaints or Symptoms Medical History: Negative for: Lupus Erythematosus; Raynaudos; Scleroderma Neurologic Complaints and Symptoms: No Complaints or Symptoms Medical History: Negative for: Dementia; Neuropathy; Quadriplegia; Paraplegia; Seizure Disorder Oncologic Complaints and Symptoms: No Complaints or Symptoms Medical History: Negative for: Received Chemotherapy; Received Radiation Psychiatric Complaints and Symptoms: No Complaints or Symptoms Medical History: Negative for: Anorexia/bulimia; Confinement Anxiety HBO Extended History Items Eyes: Cataracts Garretson, Glayds M. ([id number]) Immunizations Pneumococcal Vaccine: Received  Pneumococcal Vaccination: Yes Family and Social History Former smoker - 2000; Marital Status - Divorced; Alcohol Use: Moderate; Drug Use: No History; Caffeine Use: Never; Advanced Directives: No; Patient does not want information on Advanced Directives; Living Will: No Physician Affirmation I have reviewed and agree with the above information. Electronic Signature(s) Signed: 11/25/2016 4:12:58 PM  By: Evlyn Kanner MD, FACS Signed: 11/25/2016 4:43:58 PM By: Elliot Gurney BSN, RN, CWS, Kim RN, BSN Entered By: Evlyn Kanner on 11/25/2016 14:00:15 [name], [name] ([id number]) -------------------------------------------------------------------------------- SuperBill Details Patient Name: AMBERLY, [name]. Date of Service: 11/25/2016 Medical Record Number: [id number] Patient Account Number: [id number] Date of Birth/Sex: [birth year] (76 y.o. Female) Treating RN: Huel Coventry Primary Care Provider: Bethann Punches Other Clinician: Referring Provider: Bethann Punches Treating Provider/Extender: Rudene Re in Treatment: 0 Diagnosis Coding ICD-10 Codes Code Description S71.101A Unspecified open wound, right thigh, initial encounter L02.415 Cutaneous abscess of right lower limb Facility Procedures CPT4 Code: [id number] Description: [phone] - WOUND CARE VISIT-LEV 2 EST PT Modifier: Quantity: 1 Physician Procedures CPT4 Code: [id number] Description: 99204 - WC PHYS LEVEL 4 - NEW PT ICD-10 Description Diagnosis S71.101A Unspecified open wound, right thigh, initial en L02.415 Cutaneous abscess of right lower limb Modifier: counter Quantity: 1 Electronic Signature(s) Signed: 11/25/2016 4:12:58 PM By: Evlyn Kanner MD, FACS Signed: 11/25/2016 4:43:58 PM By: Elliot Gurney, BSN, RN, CWS, Kim RN, BSN Previous Signature: 11/25/2016 2:33:46 PM Version By: Evlyn Kanner MD, FACS Entered By: Elliot Gurney, BSN, RN, CWS, Kim on 11/25/2016 14:42:27

## 2016-11-27 NOTE — Progress Notes (Signed)
RAKIA, FRAYNE ([id number]) Visit Report for 11/25/2016 Abuse/Suicide Risk Screen Details Patient Name: [name], [name]. Date of Service: 11/25/2016 1:30 PM Medical Record Number: [id number] Patient Account Number: [id number] Date of Birth/Sex: [birth year] (76 y.o. Female) Treating RN: Huel Coventry Primary Care Catrinia Racicot: Bethann Punches Other Clinician: Referring Leodan Bolyard: Bethann Punches Treating Geremy Rister/Extender: Rudene Re in Treatment: 0 Abuse/Suicide Risk Screen Items Answer ABUSE/SUICIDE RISK SCREEN: Has anyone close to you tried to hurt or harm you recentlyo No Do you feel uncomfortable with anyone in your familyo No Has anyone forced you do things that you didnot want to doo No Do you have any thoughts of harming yourselfo No Patient displays signs or symptoms of abuse and/or neglect. No Electronic Signature(s) Signed: 11/25/2016 4:43:58 PM By: Elliot Gurney, BSN, RN, CWS, Kim RN, BSN Entered By: Elliot Gurney, BSN, RN, CWS, Kim on 11/25/2016 13:47:30 Shackett, Kelby Aline ([id number]) -------------------------------------------------------------------------------- Activities of Daily Living Details Patient Name: [name], [name]. Date of Service: 11/25/2016 1:30 PM Medical Record Number: [id number] Patient Account Number: [id number] Date of Birth/Sex: 04-08-1941 (76 y.o. Female) Treating RN: Huel Coventry Primary Care Lurdes Haltiwanger: Bethann Punches Other Clinician: Referring Freddrick Gladson: Bethann Punches Treating Taneshia Lorence/Extender: Rudene Re in Treatment: 0 Activities of Daily Living Items Answer Activities of Daily Living (Please select one for each item) Drive Automobile Completely Able Take Medications Completely Able Use Telephone Completely Able Care for Appearance Completely Able Use Toilet Completely Able Bath / Shower Completely Able Dress Self Completely Able Feed Self Completely Able Walk Completely Able Get In / Out Bed Completely Able Housework Completely Able Prepare Meals  Completely Able Handle Money Completely Able Shop for Self Completely Able Electronic Signature(s) Signed: 11/25/2016 4:43:58 PM By: Elliot Gurney, BSN, RN, CWS, Kim RN, BSN Entered By: Elliot Gurney, BSN, RN, CWS, Kim on 11/25/2016 13:47:41 Ratchford, Kelby Aline ([id number]) -------------------------------------------------------------------------------- Education Assessment Details Patient Name: Maureen Sierras. Date of Service: 11/25/2016 1:30 PM Medical Record Number: [id number] Patient Account Number: [id number] Date of Birth/Sex: 06-08-1941 (76 y.o. Female) Treating RN: Huel Coventry Primary Care Riyaan Heroux: Bethann Punches Other Clinician: Referring Vendetta Pittinger: Bethann Punches Treating Giang Hemme/Extender: Rudene Re in Treatment: 0 Primary Learner Assessed: Patient Learning Preferences/Education Level/Primary Language Learning Preference: Explanation, Demonstration Highest Education Level: College or Above Preferred Language: English Cognitive Barrier Assessment/Beliefs Language Barrier: No Translator Needed: No Memory Deficit: No Emotional Barrier: No Cultural/Religious Beliefs Affecting Medical No Care: Physical Barrier Assessment Impaired Vision: No Impaired Hearing: No Decreased Hand dexterity: No Knowledge/Comprehension Assessment Knowledge Level: Medium Comprehension Level: Medium Ability to understand written Medium instructions: Ability to understand verbal Medium instructions: Motivation Assessment Anxiety Level: Calm Cooperation: Cooperative Education Importance: Acknowledges Need Interest in Health Problems: Asks Questions Perception: Coherent Willingness to Engage in Self- High Management Activities: Readiness to Engage in Self- High Management Activities: Electronic Signature(s) GENA, LASKI ([id number]) Signed: 11/25/2016 4:43:58 PM By: Elliot Gurney, BSN, RN, CWS, Kim RN, BSN Entered By: Elliot Gurney, BSN, RN, CWS, Kim on 11/25/2016 13:48:06 [place]cherside, Kelby Aline  ([id number]) -------------------------------------------------------------------------------- Fall Risk Assessment Details Patient Name: Maureen Sierras. Date of Service: 11/25/2016 1:30 PM Medical Record Number: [id number] Patient Account Number: [id number] Date of Birth/Sex: Oct 18, 1940 (76 y.o. Female) Treating RN: Huel Coventry Primary Care Shaniquia Brafford: Bethann Punches Other Clinician: Referring Katlen Seyer: Bethann Punches Treating Dejha King/Extender: Rudene Re in Treatment: 0 Fall Risk Assessment Items Have you had 2 or more falls in the last 12 monthso 0 Yes Have you had any fall that resulted in injury in the last 12 monthso 0 Yes FALL RISK ASSESSMENT: History  of falling - immediate or within 3 months 0 No Secondary diagnosis 0 No Ambulatory aid None/bed rest/wheelchair/nurse 0 Yes Crutches/cane/walker 0 No Furniture 0 No IV Access/Saline Lock 0 No Gait/Training Normal/bed rest/immobile 0 Yes Weak 0 No Impaired 0 No Mental Status Oriented to own ability 0 Yes Electronic Signature(s) Signed: 11/25/2016 4:43:58 PM By: Elliot GurneyWoody, BSN, RN, CWS, Kim RN, BSN Entered By: Elliot GurneyWoody, BSN, RN, CWS, Kim on 11/25/2016 13:48:15 Yuille, Kelby AlineMARTHA M. ([id number]) -------------------------------------------------------------------------------- Nutrition Risk Assessment Details Patient Name: [name], [name]. Date of Service: 11/25/2016 1:30 PM Medical Record Number: [id number] Patient Account Number: [id number] Date of Birth/Sex: 04/23/1941 (76 y.o. Female) Treating RN: Huel CoventryWoody, Kim Primary Care Madalina Rosman: Bethann PunchesMiller, Mark Other Clinician: Referring Alexsis Branscom: Bethann PunchesMiller, Mark Treating Kynadie Yaun/Extender: Rudene ReBritto, Errol Weeks in Treatment: 0 Height (in): 64 Weight (lbs): Body Mass Index (BMI): Nutrition Risk Assessment Items NUTRITION RISK SCREEN: I have an illness or condition that made me change the kind and/or 0 No amount of food I eat I eat fewer than two meals per day 0 No I eat few fruits and  vegetables, or milk products 0 No I have three or more drinks of beer, liquor or wine almost every day 0 No I have tooth or mouth problems that make it hard for me to eat 0 No I don't always have enough money to buy the food I need 0 No I eat alone most of the time 1 Yes I take three or more different prescribed or over-the-counter drugs a 1 Yes day Without wanting to, I have lost or gained 10 pounds in the last six 0 No months I am not always physically able to shop, cook and/or feed myself 0 No Nutrition Protocols Good Risk Protocol 0 No interventions needed Moderate Risk Protocol Electronic Signature(s) Signed: 11/25/2016 4:43:58 PM By: Elliot GurneyWoody, BSN, RN, CWS, Kim RN, BSN Entered By: Elliot GurneyWoody, BSN, RN, CWS, Kim on 11/25/2016 13:48:29

## 2016-11-29 LAB — AEROBIC/ANAEROBIC CULTURE (SURGICAL/DEEP WOUND)

## 2016-11-29 LAB — AEROBIC/ANAEROBIC CULTURE W GRAM STAIN (SURGICAL/DEEP WOUND): Special Requests: NORMAL

## 2016-11-30 NOTE — Progress Notes (Signed)
PHARMACY CONSULT NOTE - INITIAL   Pharmacy Consult for Wound Culture Results   Microbiology: Recent Results (from the past 720 hour(s))  Aerobic/Anaerobic Culture (surgical/deep wound)     Status: None   Collection Time: 11/24/16  5:56 PM  Result Value Ref Range Status   Specimen Description ABSCESS  Final   Special Requests Normal  Final   Gram Stain   Final    FEW WBC PRESENT, PREDOMINANTLY PMN MODERATE GRAM POSITIVE COCCI IN CLUSTERS    Culture   Final    ABUNDANT METHICILLIN RESISTANT STAPHYLOCOCCUS AUREUS NO ANAEROBES ISOLATED Performed at [facility], [address] [place], [place] [address]    Report Status 11/29/2016 FINAL  Final   Organism ID, Bacteria METHICILLIN RESISTANT STAPHYLOCOCCUS AUREUS  Final      Susceptibility   Methicillin resistant staphylococcus aureus - MIC*    CIPROFLOXACIN >=8 RESISTANT Resistant     ERYTHROMYCIN >=8 RESISTANT Resistant     GENTAMICIN <=0.5 SENSITIVE Sensitive     OXACILLIN >=4 RESISTANT Resistant     TETRACYCLINE <=1 SENSITIVE Sensitive     VANCOMYCIN <=0.5 SENSITIVE Sensitive     TRIMETH/SULFA <=10 SENSITIVE Sensitive     CLINDAMYCIN >=8 RESISTANT Resistant     RIFAMPIN <=0.5 SENSITIVE Sensitive     Inducible Clindamycin NEGATIVE Sensitive     * ABUNDANT METHICILLIN RESISTANT STAPHYLOCOCCUS AUREUS     Assessment: 76 yo female seen in ED on 6/7 and [facility]. 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   Plan:  According to wound care notes patient is taking doxycycline and wound appears to be healing. No additional action needed at this time.   Gardner CandleSheema M Nelson Julson, PharmD, BCPS Clinical Pharmacist 11/30/2016 12:40 PM

## 2016-12-02 ENCOUNTER — Encounter: Payer: Medicare Other | Admitting: Surgery

## 2016-12-02 DIAGNOSIS — S71101A Unspecified open wound, right thigh, initial encounter: Secondary | ICD-10-CM | POA: Diagnosis not present

## 2016-12-04 NOTE — Progress Notes (Signed)
[name], [name] ([id number]) Visit Report for 12/02/2016 Chief Complaint Document Details Patient Name: [name], [name]. Date of Service: 12/02/2016 1:30 PM Medical Record Number: [id number] Patient Account Number: [id number] Date of Birth/Sex: Apr 15, 1941 (76 y.o. Female) Treating RN: [name] Primary Care Provider: Bethann Garcia Other Clinician: Referring Provider: Bethann Garcia Treating Provider/Extender: [name] in Treatment: 1 Information Obtained from: Patient Chief Complaint Patient presents to the wound care center for a consult due non healing wound to the right upper thigh posteriorly just below her gluteal fold which she's had for about a week Electronic Signature(s) Signed: 12/02/2016 2:21:19 PM By: Maureen Kanner MD, FACS Entered By: [name] on 12/02/2016 14:21:19 [name], [name] ([id number]) -------------------------------------------------------------------------------- Debridement Details Patient Name: [name]. Date of Service: 12/02/2016 1:30 PM Medical Record Number: [id number] Patient Account Number: [id number] Date of Birth/Sex: 10-06-1940 (76 y.o. Female) Treating RN: [name] Primary Care Provider: Bethann Garcia Other Clinician: Referring Provider: Bethann Garcia Treating Provider/Extender: [name] in Treatment: 1 Debridement Performed for Wound #1 Right Upper Leg Assessment: Performed By: Physician Maureen Kanner, MD Debridement: Debridement Pre-procedure Verification/Time Out Yes - 13:47 Taken: Start Time: 13:47 Pain Control: Lidocaine 4% Topical Solution Level: Skin/Subcutaneous Tissue Total Area Debrided (L x 1.3 (cm) x 2 (cm) = 2.6 (cm) W): Tissue and other Viable, Non-Viable, Fibrin/Slough, Subcutaneous material debrided: Instrument: Forceps, Scissors Bleeding: Minimum Hemostasis Achieved: Pressure End Time: 13:49 Procedural Pain: 0 Post Procedural Pain: 0 Response to Treatment:  Procedure was tolerated well Post Debridement Measurements of Total Wound Length: (cm) 1.3 Width: (cm) 2 Depth: (cm) 0.7 Volume: (cm) 1.429 Character of Wound/Ulcer Post Improved Debridement: Post Procedure Diagnosis Same as Pre-procedure Electronic Signature(s) Signed: 12/02/2016 2:21:13 PM By: Maureen Kanner MD, FACS Signed: 12/02/2016 4:38:01 PM By: [name] Entered By: [name] on 12/02/2016 14:21:13 [name], [name] ([id number]) -------------------------------------------------------------------------------- HPI Details Patient Name: [name]. Date of Service: 12/02/2016 1:30 PM Medical Record Number: [id number] Patient Account Number: [id number] Date of Birth/Sex: Apr 16, 1941 (76 y.o. Female) Treating RN: [name] Primary Care Provider: Bethann Garcia Other Clinician: Referring Provider: Bethann Garcia Treating Provider/Extender: [name] in Treatment: 1 History of Present Illness Location: recent IandD for a indurated abscess on the right gluteal fold and upper thigh Quality: Patient reports experiencing a sharp pain to affected area(s). Severity: Patient states wound are getting worse. Duration: Patient has had the wound for < 1 week prior to presenting for treatment Timing: Pain in wound is constant (hurts all the time) Context: The wound would happen gradually Modifying Factors: Other treatment(s) tried include:antibiotics given to her by her PCP and IandD done in the ER Associated Signs and Symptoms: Patient reports having increase discharge. HPI Description: 76 year old patient who was seen in the emergency department for abscess on the back of her right thigh. She had an abscess with erythema and purulent drainage. This was drained under local anesthesia and packed with 1/4 inch gauze. she received IV antibiotics -- vancomycin, while she was there Past medical history of arthritis, atrial fibrillation chronic kidney disease and  status post right partial knee replacement. He has also had a cholecystectomy, eye surgery and tonsillectomy. She is a former smoker quit in 2000. 12/02/2016 -- the patient is in a much better frame of mind today and is accompanied by her daughter who has been helping her with her care Electronic Signature(s) Signed: 12/02/2016 2:21:44 PM By: Maureen Kanner MD, FACS Entered By: [name] on 12/02/2016 14:21:44 [name], [name] ([id number]) --------------------------------------------------------------------------------  Physical Exam Details Patient Name: [name], [name]. Date of Service: 12/02/2016 1:30 PM Medical Record Number: [id number] Patient Account Number: [id number] Date of Birth/Sex: Jun 26, 1940 (76 y.o. Female) Treating RN: [name] Primary Care Provider: Bethann Garcia Other Clinician: Referring Provider: Bethann Garcia Treating Provider/Extender: [name] in Treatment: 1 Constitutional . Pulse regular. Respirations normal and unlabored. Afebrile. . Eyes Nonicteric. Reactive to light. Ears, Nose, Mouth, and Throat Lips, teeth, and gums WNL.Marland Kitchen Moist mucosa without lesions. Neck supple and nontender. No palpable supraclavicular or cervical adenopathy. Normal sized without goiter. Respiratory WNL. No retractions.. Breath sounds WNL, No rubs, rales, rhonchi, or wheeze.. Cardiovascular Heart rhythm and rate regular, no murmur or gallop.. Pedal Pulses WNL. No clubbing, cyanosis or edema. Lymphatic No adneopathy. No adenopathy. No adenopathy. Musculoskeletal Adexa without tenderness or enlargement.. Digits and nails w/o clubbing, cyanosis, infection, petechiae, ischemia, or inflammatory conditions.. Integumentary (Hair, Skin) No suspicious lesions. No crepitus or fluctuance. No peri-wound warmth or erythema. No masses.Marland Kitchen Psychiatric Judgement and insight Intact.. No evidence of depression, anxiety, or agitation.. Notes the integration on the right  posterior upper thigh and gluteal area has now decreased remarkably and the depth of the wound is also come in nicely. There is no evidence of cellulitis. The wound is fairly shallow but there was necrotic subcutaneous debris which have sharply removed with a forcep and scissors. Electronic Signature(s) Signed: 12/02/2016 2:22:36 PM By: Maureen Kanner MD, FACS Entered By: [name] on 12/02/2016 14:22:35 [name], [name] ([id number]) -------------------------------------------------------------------------------- Physician Orders Details Patient Name: [name], [name]. Date of Service: 12/02/2016 1:30 PM Medical Record Number: [id number] Patient Account Number: [id number] Date of Birth/Sex: [birth year] (76 y.o. Female) Treating RN: [name] Primary Care Provider: Bethann Garcia Other Clinician: Referring Provider: Bethann Garcia Treating Provider/Extender: [name] in Treatment: 1 Verbal / Phone Orders: No Diagnosis Coding Wound Cleansing Wound #1 Right Upper Leg o Clean wound with Normal Saline. Anesthetic Wound #1 Right Upper Leg o Topical Lidocaine 4% cream applied to wound bed prior to debridement Primary Wound Dressing Wound #1 Right Upper Leg o Aquacel Ag Secondary Dressing Wound #1 Right Upper Leg o Dry Gauze o Boardered Foam Dressing Dressing Change Frequency Wound #1 Right Upper Leg o Change dressing every day. Follow-up Appointments Wound #1 Right Upper Leg o Return Appointment in 1 week. Home Health Wound #1 Right Upper Leg o Continue Home Health Visits o Home Health Nurse may visit PRN to address patientos wound care needs. o FACE TO FACE ENCOUNTER: MEDICARE and MEDICAID PATIENTS: I certify that this patient is under my care and that I had a face-to-face encounter that meets the physician face-to-face encounter requirements with this patient on this date. The encounter with the patient was in whole or in part for the  following MEDICAL CONDITION: (primary reason for Home Healthcare) MEDICAL NECESSITY: I certify, that based on my findings, NURSING services are a medically necessary home health service. HOME BOUND STATUS: I certify that my clinical findings [name], [name]. ([id number]) support that this patient is homebound (i.e., Due to illness or injury, pt requires aid of supportive devices such as crutches, cane, wheelchairs, walkers, the use of special transportation or the assistance of another person to leave their place of residence. There is a normal inability to leave the home and doing so requires considerable and taxing effort. Other absences are for medical reasons / religious services and are infrequent or of short duration when for other reasons). o If current dressing causes regression in wound  condition, may D/C ordered dressing product/s and apply Normal Saline Moist Dressing daily until next Wound Healing Center / Other MD appointment. Notify Wound Healing Center of regression in wound condition at [phone]. o Please direct any NON-WOUND related issues/requests for orders to patient's Primary Care Physician Electronic Signature(s) Signed: 12/02/2016 4:13:16 PM By: Maureen Kanner MD, FACS Signed: 12/02/2016 4:38:01 PM By: [name] Entered By: [name] on 12/02/2016 13:51:09 Letizia, [name] ([id number]) -------------------------------------------------------------------------------- Problem List Details Patient Name: [name], [name]. Date of Service: 12/02/2016 1:30 PM Medical Record Number: [id number] Patient Account Number: [id number] Date of Birth/Sex: 1941-04-22 (76 y.o. Female) Treating RN: [name] Primary Care Provider: Bethann Garcia Other Clinician: Referring Provider: Bethann Garcia Treating Provider/Extender: [name] in Treatment: 1 Active Problems ICD-10 Encounter Code Description Active Date Diagnosis S71.101A Unspecified open  wound, right thigh, initial encounter 11/25/2016 Yes L02.415 Cutaneous abscess of right lower limb 11/25/2016 Yes Inactive Problems Resolved Problems Electronic Signature(s) Signed: 12/02/2016 2:20:58 PM By: Maureen Kanner MD, FACS Entered By: [name] on 12/02/2016 14:20:58 Goll, [name] ([id number]) -------------------------------------------------------------------------------- Progress Note Details Patient Name: [name]. Date of Service: 12/02/2016 1:30 PM Medical Record Number: [id number] Patient Account Number: [id number] Date of Birth/Sex: May 23, 1941 (76 y.o. Female) Treating RN: [name] Primary Care Provider: Bethann Garcia Other Clinician: Referring Provider: Bethann Garcia Treating Provider/Extender: [name] in Treatment: 1 Subjective Chief Complaint Information obtained from Patient Patient presents to the wound care center for a consult due non healing wound to the right upper thigh posteriorly just below her gluteal fold which she's had for about a week History of Present Illness (HPI) The following HPI elements were documented for the patient's wound: Location: recent IandD for a indurated abscess on the right gluteal fold and upper thigh Quality: Patient reports experiencing a sharp pain to affected area(s). Severity: Patient states wound are getting worse. Duration: Patient has had the wound for < 1 week prior to presenting for treatment Timing: Pain in wound is constant (hurts all the time) Context: The wound would happen gradually Modifying Factors: Other treatment(s) tried include:antibiotics given to her by her PCP and IandD done in the ER Associated Signs and Symptoms: Patient reports having increase discharge. 76 year old patient who was seen in the emergency department for abscess on the back of her right thigh. She had an abscess with erythema and purulent drainage. This was drained under local anesthesia and packed with 1/4  inch gauze. she received IV antibiotics -- vancomycin, while she was there Past medical history of arthritis, atrial fibrillation chronic kidney disease and status post right partial knee replacement. He has also had a cholecystectomy, eye surgery and tonsillectomy. She is a former smoker quit in 2000. 12/02/2016 -- the patient is in a much better frame of mind today and is accompanied by her daughter who has been helping her with her care Objective Constitutional Pulse regular. Respirations normal and unlabored. Afebrile. Vitals Time Taken: 1:23 PM, Height: 64 in, Temperature: 98.0 F, Pulse: 76 bpm, Respiratory Rate: 16 [name], [name]. ([id number]) breaths/min, Blood Pressure: 122/61 mmHg. Eyes Nonicteric. Reactive to light. Ears, Nose, Mouth, and Throat Lips, teeth, and gums WNL.Marland Kitchen Moist mucosa without lesions. Neck supple and nontender. No palpable supraclavicular or cervical adenopathy. Normal sized without goiter. Respiratory WNL. No retractions.. Breath sounds WNL, No rubs, rales, rhonchi, or wheeze.. Cardiovascular Heart rhythm and rate regular, no murmur or gallop.. Pedal Pulses WNL. No clubbing, cyanosis or edema. Lymphatic No adneopathy. No adenopathy. No adenopathy. Musculoskeletal  Adexa without tenderness or enlargement.. Digits and nails w/o clubbing, cyanosis, infection, petechiae, ischemia, or inflammatory conditions.Marland Kitchen. Psychiatric Judgement and insight Intact.. No evidence of depression, anxiety, or agitation.. General Notes: the integration on the right posterior upper thigh and gluteal area has now decreased remarkably and the depth of the wound is also come in nicely. There is no evidence of cellulitis. The wound is fairly shallow but there was necrotic subcutaneous debris which have sharply removed with a forcep and scissors. Integumentary (Hair, Skin) No suspicious lesions. No crepitus or fluctuance. No peri-wound warmth or erythema. No masses.. Wound #1  status is Open. Original cause of wound was Bump. The wound is located on the Right Upper Leg. The wound measures 1.3cm length x 2cm width x 0.6cm depth; 2.042cm^2 area and 1.225cm^3 volume. There is Fat Layer (Subcutaneous Tissue) Exposed exposed. There is no tunneling or undermining noted. There is a large amount of serosanguineous drainage noted. The wound margin is indistinct and nonvisible. There is large (67-100%) red granulation within the wound bed. There is a small (1-33%) amount of necrotic tissue within the wound bed including Adherent Slough. The periwound skin appearance exhibited: Induration, Ecchymosis. The periwound skin appearance did not exhibit: Callus, Crepitus, Excoriation, Rash, Scarring, Dry/Scaly, Maceration, Atrophie Blanche, Cyanosis, Hemosiderin Staining, Mottled, Pallor, Rubor, Erythema. Assessment [name], Maureen AlineMARTHA M. ([id number]) Active Problems ICD-10 S71.101A - Unspecified open wound, right thigh, initial encounter L02.415 - Cutaneous abscess of right lower limb Procedures Wound #1 Pre-procedure diagnosis of Wound #1 is an Abscess located on the Right Upper Leg . There was a Skin/Subcutaneous Tissue Debridement ([id number](11042-11047) debridement with total area of 2.6 sq cm performed by Maureen KannerBritto, Lilya Smitherman, MD. with the following instrument(s): Forceps and Scissors to remove Viable and Non- Viable tissue/material including Fibrin/Slough and Subcutaneous after achieving pain control using Lidocaine 4% Topical Solution. A time out was conducted at 13:47, prior to the start of the procedure. A Minimum amount of bleeding was controlled with Pressure. The procedure was tolerated well with a pain level of 0 throughout and a pain level of 0 following the procedure. Post Debridement Measurements: 1.3cm length x 2cm width x 0.7cm depth; 1.429cm^3 volume. Character of Wound/Ulcer Post Debridement is improved. Post procedure Diagnosis Wound #1: Same as Pre-Procedure Plan Wound  Cleansing: Wound #1 Right Upper Leg: Clean wound with Normal Saline. Anesthetic: Wound #1 Right Upper Leg: Topical Lidocaine 4% cream applied to wound bed prior to debridement Primary Wound Dressing: Wound #1 Right Upper Leg: Aquacel Ag Secondary Dressing: Wound #1 Right Upper Leg: Dry Gauze Boardered Foam Dressing Dressing Change Frequency: Wound #1 Right Upper Leg: Change dressing every day. Follow-up Appointments: Maureen SierrasSHOFFNER, Idali M. ([id number]) Wound #1 Right Upper Leg: Return Appointment in 1 week. Home Health: Wound #1 Right Upper Leg: Continue Home Health Visits Home Health Nurse may visit PRN to address patient s wound care needs. FACE TO FACE ENCOUNTER: MEDICARE and MEDICAID PATIENTS: I certify that this patient is under my care and that I had a face-to-face encounter that meets the physician face-to-face encounter requirements with this patient on this date. The encounter with the patient was in whole or in part for the following MEDICAL CONDITION: (primary reason for Home Healthcare) MEDICAL NECESSITY: I certify, that based on my findings, NURSING services are a medically necessary home health service. HOME BOUND STATUS: I certify that my clinical findings support that this patient is homebound (i.e., Due to illness or injury, pt requires aid of supportive devices such as crutches, cane, wheelchairs, walkers,  the use of special transportation or the assistance of another person to leave their place of residence. There is a normal inability to leave the home and doing so requires considerable and taxing effort. Other absences are for medical reasons / religious services and are infrequent or of short duration when for other reasons). If current dressing causes regression in wound condition, may D/C ordered dressing product/s and apply Normal Saline Moist Dressing daily until next Wound Healing Center / Other MD appointment. Notify Wound Healing Center of regression in  wound condition at [phone]. Please direct any NON-WOUND related issues/requests for orders to patient's Primary Care Physician The patient has improved a lot since last week and at this stage I have recommended packing the wound with silver alginate and a bordered foam to be changed daily over the next week. home health has been helping her and she has been getting some of her dressing changes at her PCPs office. She will continue with all other supportive care. Electronic Signature(s) Signed: 12/02/2016 2:23:32 PM By: Maureen Kanner MD, FACS Entered By: [name] on 12/02/2016 14:23:32 Kavan, [name] ([id number]) -------------------------------------------------------------------------------- SuperBill Details Patient Name: [name]. Date of Service: 12/02/2016 Medical Record Number: [id number] Patient Account Number: [id number] Date of Birth/Sex: [birth year] (76 y.o. Female) Treating RN: [name] Primary Care Provider: Bethann Garcia Other Clinician: Referring Provider: Bethann Garcia Treating Provider/Extender: [name] in Treatment: 1 Diagnosis Coding ICD-10 Codes Code Description S71.101A Unspecified open wound, right thigh, initial encounter L02.415 Cutaneous abscess of right lower limb Facility Procedures CPT4 Code: [id number] Description: 11042 - DEB SUBQ TISSUE 20 SQ CM/< ICD-10 Description Diagnosis S71.101A Unspecified open wound, right thigh, initial enc L02.415 Cutaneous abscess of right lower limb Modifier: ounter Quantity: 1 Physician Procedures CPT4 Code: [id number] Description: 11042 - WC PHYS SUBQ TISS 20 SQ CM ICD-10 Description Diagnosis S71.101A Unspecified open wound, right thigh, initial enc L02.415 Cutaneous abscess of right lower limb Modifier: ounter Quantity: 1 Electronic Signature(s) Signed: 12/02/2016 2:23:43 PM By: Maureen Kanner MD, FACS Entered By: [name] on 12/02/2016 14:23:43

## 2016-12-04 NOTE — Progress Notes (Signed)
[name], [name]. ([id number]) Visit Report for 12/02/2016 Arrival Information Details Patient Name: [name], [name]. Date of Service: 12/02/2016 1:30 PM Medical Record Number: [id number] Patient Account Number: [id number] Date of Birth/Sex: 04/07/1941 (76 y.o. Female) Treating RN: [name] Primary Care [name]: [name] Other Clinician: Referring Kasheena Sambrano: [name] Treating Riely Baskett/Extender: [name] in Treatment: 1 Visit Information History Since Last Visit Added or deleted any medications: No Patient Arrived: Ambulatory Any new allergies or adverse reactions: No Arrival Time: 13:22 Had a fall or experienced change in No Accompanied By: dtr activities of daily living that may affect Transfer Assistance: None risk of falls: Patient Identification Verified: Yes Signs or symptoms of abuse/neglect since last No Secondary Verification Process Yes visito Completed: Hospitalized since last visit: No Patient Has Alerts: Yes Has Dressing in Place as Prescribed: Yes Patient Alerts: Patient on Blood Pain Present Now: No Thinner aspirin 81 mg Electronic Signature(s) Signed: 12/02/2016 4:38:01 PM By: [name] Entered By: [name] on 12/02/2016 13:23:39 Ingber, Maureen AlineMARTHA M. ([id number]) -------------------------------------------------------------------------------- Encounter Discharge Information Details Patient Name: [name], [name]. Date of Service: 12/02/2016 1:30 PM Medical Record Number: [id number] Patient Account Number: [id number] Date of Birth/Sex: 06/29/1940 (76 y.o. Female) Treating RN: [name] Primary Care Quantavious Eggert: [name] Other Clinician: Referring Lucero Ide: [name] Treating Kaari Zeigler/Extender: [name] in Treatment: 1 Encounter Discharge Information Items Discharge Pain Level: 0 Discharge Condition: Stable Ambulatory Status: Ambulatory Discharge Destination: Home Transportation: Private  Auto Accompanied By: dtr Schedule Follow-up Appointment: Yes Medication Reconciliation completed and provided to Patient/Care No Ebone Alcivar: Provided on Clinical Summary of Care: 12/02/2016 Form Type Recipient Paper Patient MS Electronic Signature(s) Signed: 12/02/2016 2:23:05 PM By: [name] Previous Signature: 12/02/2016 2:03:52 PM Version By: [name] Entered By: [name] on 12/02/2016 14:23:05 [name], Maureen AlineMARTHA M. ([id number]) -------------------------------------------------------------------------------- Multi Wound Chart Details Patient Name: [name], [name]. Date of Service: 12/02/2016 1:30 PM Medical Record Number: [id number] Patient Account Number: [id number] Date of Birth/Sex: 09/14/1940 (76 y.o. Female) Treating RN: [name] Primary Care Nargis Abrams: [name] Other Clinician: Referring Tanis Hensarling: [name] Treating [name]: [name] in Treatment: 1 Vital Signs Height(in): 64 Pulse(bpm): 76 Weight(lbs): Blood Pressure 122/61 (mmHg): Body Mass Index(BMI): Temperature(F): 98.0 Respiratory Rate 16 (breaths/min): Photos: [1:No Photos] [N/A:N/A] Wound Location: [1:Right Upper Leg] [N/A:N/A] Wounding Event: [1:Bump] [N/A:N/A] Primary Etiology: [1:Abscess] [N/A:N/A] Comorbid History: [1:Cataracts, Hypertension, Osteoarthritis] [N/A:N/A] Date Acquired: [1:11/20/2016] [N/A:N/A] Garcia of Treatment: [1:1] [N/A:N/A] Wound Status: [1:Open] [N/A:N/A] Measurements L x W x D 1.3x2x0.6 [N/A:N/A] (cm) Area (cm) : [1:2.042] [N/A:N/A] Volume (cm) : [1:1.225] [N/A:N/A] % Reduction in Area: [1:62.90%] [N/A:N/A] % Reduction in Volume: 77.70% [N/A:N/A] Classification: [1:Full Thickness Without Exposed Support Structures] [N/A:N/A] Exudate Amount: [1:Large] [N/A:N/A] Exudate Type: [1:Serosanguineous] [N/A:N/A] Exudate Color: [1:red, brown] [N/A:N/A] Wound Margin: [1:Indistinct, nonvisible] [N/A:N/A] Granulation Amount: [1:Large  (67-100%)] [N/A:N/A] Granulation Quality: [1:Red] [N/A:N/A] Necrotic Amount: [1:Small (1-33%)] [N/A:N/A] Exposed Structures: [1:Fat Layer (Subcutaneous Tissue) Exposed: Yes Fascia: No Tendon: No Muscle: No] [N/A:N/A] Joint: No Bone: No Epithelialization: None N/A N/A Debridement: Debridement ([id number](11042- N/A N/A 11047) Pre-procedure 13:47 N/A N/A Verification/Time Out Taken: Pain Control: Lidocaine 4% Topical N/A N/A Solution Tissue Debrided: Fibrin/Slough, N/A N/A Subcutaneous Level: Skin/Subcutaneous N/A N/A Tissue Debridement Area (sq 2.6 N/A N/A cm): Instrument: Forceps, Scissors N/A N/A Bleeding: Minimum N/A N/A Hemostasis Achieved: Pressure N/A N/A Procedural Pain: 0 N/A N/A Post Procedural Pain: 0 N/A N/A Debridement Treatment Procedure was tolerated N/A N/A Response: well Post Debridement 1.3x2x0.7 N/A N/A Measurements L x W x D (cm)  Post Debridement 1.429 N/A N/A Volume: (cm) Periwound Skin Texture: Induration: Yes N/A N/A Excoriation: No Callus: No Crepitus: No Rash: No Scarring: No Periwound Skin Maceration: No N/A N/A Moisture: Dry/Scaly: No Periwound Skin Color: Ecchymosis: Yes N/A N/A Atrophie Blanche: No Cyanosis: No Erythema: No Hemosiderin Staining: No Mottled: No Pallor: No Rubor: No Tenderness on No N/A N/A Palpation: Wound Preparation: Ulcer Cleansing: N/A N/A Rinsed/Irrigated with Saline Swearingin, [name] ([id number]) Topical Anesthetic Applied: Other: lidocaine 4% Procedures Performed: Debridement N/A N/A Treatment Notes Electronic Signature(s) Signed: 12/02/2016 2:21:03 PM By: Maureen Kanner MD, FACS Entered By: [name] on 12/02/2016 14:21:03 [name], [name] ([id number]) -------------------------------------------------------------------------------- Multi-Disciplinary Care Plan Details Patient Name: [name], LAGUARDIA. Date of Service: 12/02/2016 1:30 PM Medical Record Number: [id number] Patient Account Number:  [id number] Date of Birth/Sex: 13-Sep-1940 (76 y.o. Female) Treating RN: Maureen Sites Primary Care Zakaria Fromer: Maureen Punches Other Clinician: Referring Taci Sterling: Maureen Punches Treating Charmin Aguiniga/Extender: Maureen Re in Treatment: 1 Active Inactive ` Abuse / Safety / Falls / Self Care Management Nursing Diagnoses: Self care deficit: actual or potential Goals: Patient/caregiver will verbalize/demonstrate measure taken to improve self care Date Initiated: 11/25/2016 Target Resolution Date: 12/16/2016 Goal Status: Active Interventions: Assess self care needs on admission and as needed Treatment Activities: Patient referred to home care : 11/25/2016 Notes: ` Necrotic Tissue Nursing Diagnoses: Impaired tissue integrity related to necrotic/devitalized tissue Goals: Patient/caregiver will verbalize understanding of reason and process for debridement of necrotic tissue Date Initiated: 11/25/2016 Target Resolution Date: 12/12/2016 Goal Status: Active Interventions: Assess patient pain level pre-, during and post procedure and prior to discharge Treatment Activities: Apply topical anesthetic as ordered : 11/25/2016 Notes: JAMAIYA, TUNNELL ([id number]) Orientation to the Wound Care Program Nursing Diagnoses: Knowledge deficit related to the wound healing center program Goals: Patient/caregiver will verbalize understanding of the Wound Healing Center Program Date Initiated: 11/25/2016 Target Resolution Date: 12/12/2016 Goal Status: Active Interventions: Provide education on orientation to the wound center Notes: ` Wound/Skin Impairment Nursing Diagnoses: Impaired tissue integrity Goals: Ulcer/skin breakdown will heal within 14 Garcia Date Initiated: 11/25/2016 Target Resolution Date: 12/12/2016 Goal Status: Active Interventions: Assess ulceration(s) every visit Treatment Activities: Topical wound management initiated : 11/25/2016 Notes: Electronic Signature(s) Signed: 12/02/2016  4:38:01 PM By: Maureen Sites Entered By: Maureen Sites on 12/02/2016 13:48:54 [name], [name] ([id number]) -------------------------------------------------------------------------------- Pain Assessment Details Patient Name: Maureen Sierras. Date of Service: 12/02/2016 1:30 PM Medical Record Number: [id number] Patient Account Number: [id number] Date of Birth/Sex: 1940-12-25 (76 y.o. Female) Treating RN: Maureen Sites Primary Care Sherard Sutch: Maureen Punches Other Clinician: Referring Jaelon Gatley: Maureen Punches Treating Salvadore Valvano/Extender: Maureen Re in Treatment: 1 Active Problems Location of Pain Severity and Description of Pain Patient Has Paino No Site Locations Pain Management and Medication Current Pain Management: Notes Topical or injectable lidocaine is offered to patient for acute pain when surgical debridement is performed. If needed, Patient is instructed to use over the counter pain medication for the following 24-48 hours after debridement. Wound care MDs do not prescribed pain medications. Patient has chronic pain or uncontrolled pain. Patient has been instructed to make an appointment with their Primary Care Physician for pain management Electronic Signature(s) Signed: 12/02/2016 4:38:01 PM By: Maureen Sites Entered By: Maureen Sites on 12/02/2016 13:23:47 [name], [name] ([id number]) -------------------------------------------------------------------------------- Patient/Caregiver Education Details Patient Name: Maureen Sierras. Date of Service: 12/02/2016 1:30 PM Medical Record Number: [id number] Patient Account Number: [id number] Date of Birth/Gender: 08/20/1940 (76 y.o. Female) Treating RN: Maureen Sites Primary Care Physician: Hyacinth Meeker,  Garcia Other Clinician: Referring Physician: Bethann Punches Treating Physician/Extender: Maureen Re in Treatment: 1 Education Assessment Education Provided To: Patient and Caregiver Education Topics  Provided Wound/Skin Impairment: Handouts: Other: wound care as ordered Methods: Demonstration, Explain/Verbal Responses: State content correctly Electronic Signature(s) Signed: 12/02/2016 4:38:01 PM By: Maureen Sites Entered By: Maureen Sites on 12/02/2016 14:23:35 [name], [name] ([id number]) -------------------------------------------------------------------------------- Wound Assessment Details Patient Name: Maureen Sierras. Date of Service: 12/02/2016 1:30 PM Medical Record Number: [id number] Patient Account Number: [id number] Date of Birth/Sex: 12/05/1940 (76 y.o. Female) Treating RN: Maureen Sites Primary Care Masiya Claassen: Maureen Punches Other Clinician: Referring Nieve Rojero: Maureen Punches Treating Wong Steadham/Extender: Maureen Re in Treatment: 1 Wound Status Wound Number: 1 Primary Abscess Etiology: Wound Location: Right Upper Leg Wound Status: Open Wounding Event: Bump Comorbid Cataracts, Hypertension, Date Acquired: 11/20/2016 History: Osteoarthritis Garcia Of Treatment: 1 Clustered Wound: No Photos Photo Uploaded By: Maureen Sites on 12/02/2016 14:35:27 Wound Measurements Length: (cm) 1.3 Width: (cm) 2 Depth: (cm) 0.6 Area: (cm) 2.042 Volume: (cm) 1.225 % Reduction in Area: 62.9% % Reduction in Volume: 77.7% Epithelialization: None Tunneling: No Undermining: No Wound Description Full Thickness Without Exposed Classification: Support Structures Wound Margin: Indistinct, nonvisible Exudate Large Amount: Exudate Type: Serosanguineous Exudate Color: red, brown Foul Odor After Cleansing: No Slough/Fibrino Yes Wound Bed Granulation Amount: Large (67-100%) Exposed Structure Granulation Quality: Red Fascia Exposed: No Necrotic Amount: Small (1-33%) Fat Layer (Subcutaneous Tissue) Exposed: Yes Kolenda, Caeli Judie Petit ([id number]) Necrotic Quality: Adherent Slough Tendon Exposed: No Muscle Exposed: No Joint Exposed: No Bone Exposed: No Periwound  Skin Texture Texture Color No Abnormalities Noted: No No Abnormalities Noted: No Callus: No Atrophie Blanche: No Crepitus: No Cyanosis: No Excoriation: No Ecchymosis: Yes Induration: Yes Erythema: No Rash: No Hemosiderin Staining: No Scarring: No Mottled: No Pallor: No Moisture Rubor: No No Abnormalities Noted: No Dry / Scaly: No Maceration: No Wound Preparation Ulcer Cleansing: Rinsed/Irrigated with Saline Topical Anesthetic Applied: Other: lidocaine 4%, Treatment Notes Wound #1 (Right Upper Leg) 1. Cleansed with: Clean wound with Normal Saline 2. Anesthetic Topical Lidocaine 4% cream to wound bed prior to debridement 4. Dressing Applied: Aquacel Ag 5. Secondary Dressing Applied Bordered Foam Dressing Dry Gauze Electronic Signature(s) Signed: 12/02/2016 4:38:01 PM By: Maureen Sites Entered By: Maureen Sites on 12/02/2016 13:46:26 Eklund, [name] ([id number]) -------------------------------------------------------------------------------- Vitals Details Patient Name: Maureen Sierras. Date of Service: 12/02/2016 1:30 PM Medical Record Number: [id number] Patient Account Number: [id number] Date of Birth/Sex: 07/10/1940 (76 y.o. Female) Treating RN: Maureen Sites Primary Care Jule Whitsel: Maureen Punches Other Clinician: Referring Fatima Fedie: Maureen Punches Treating Maegen Wigle/Extender: Maureen Re in Treatment: 1 Vital Signs Time Taken: 13:23 Temperature (F): 98.0 Height (in): 64 Pulse (bpm): 76 Respiratory Rate (breaths/min): 16 Blood Pressure (mmHg): 122/61 Reference Range: 80 - 120 mg / dl Electronic Signature(s) Signed: 12/02/2016 4:38:01 PM By: Maureen Sites Entered By: Maureen Sites on 12/02/2016 13:25:49

## 2016-12-09 ENCOUNTER — Encounter: Payer: Medicare Other | Admitting: Physician Assistant

## 2016-12-09 DIAGNOSIS — S71101A Unspecified open wound, right thigh, initial encounter: Secondary | ICD-10-CM | POA: Diagnosis not present

## 2016-12-11 NOTE — Progress Notes (Signed)
[name], [name] ([id number]) Visit Report for 12/09/2016 Arrival Information Details Patient Name: [name], [name]. Date of Service: 12/09/2016 12:30 PM Medical Record Number: [id number] Patient Account Number: [id number] Date of Birth/Sex: [birth year] (76 y.o. Female) Treating RN: Curtis Sites Primary Care Veatrice Eckstein: Bethann Punches Other Clinician: Referring Lajuane Leatham: Bethann Punches Treating Loucille Takach/Extender: Linwood Dibbles, HOYT Weeks in Treatment: 2 Visit Information History Since Last Visit Added or deleted any medications: No Patient Arrived: Ambulatory Any new allergies or adverse reactions: No Arrival Time: 12:40 Had a fall or experienced change in No Accompanied By: self activities of daily living that may affect Transfer Assistance: None risk of falls: Patient Identification Verified: Yes Signs or symptoms of abuse/neglect since last No Secondary Verification Process Yes visito Completed: Hospitalized since last visit: No Patient Has Alerts: Yes Has Dressing in Place as Prescribed: Yes Patient Alerts: Patient on Blood Pain Present Now: No Thinner aspirin 81 mg Electronic Signature(s) Signed: 12/09/2016 5:09:52 PM By: Curtis Sites Entered By: Curtis Sites on 12/09/2016 12:41:07 Garcia, Maureen Aline ([id number]) -------------------------------------------------------------------------------- Clinic Level of Care Assessment Details Patient Name: [name]. Date of Service: 12/09/2016 12:30 PM Medical Record Number: [id number] Patient Account Number: [id number] Date of Birth/Sex: 1941-04-02 (76 y.o. Female) Treating RN: Curtis Sites Primary Care Twanna Resh: Bethann Punches Other Clinician: Referring Doneen Ollinger: Bethann Punches Treating Elane Peabody/Extender: Linwood Dibbles, HOYT Weeks in Treatment: 2 Clinic Level of Care Assessment Items TOOL 4 Quantity Score []  - Use when only an EandM is performed on FOLLOW-UP visit 0 ASSESSMENTS - Nursing Assessment / Reassessment X -  Reassessment of Co-morbidities (includes updates in patient status) 1 10 X - Reassessment of Adherence to Treatment Plan 1 5 ASSESSMENTS - Wound and Skin Assessment / Reassessment X - Simple Wound Assessment / Reassessment - one wound 1 5 []  - Complex Wound Assessment / Reassessment - multiple wounds 0 []  - Dermatologic / Skin Assessment (not related to wound area) 0 ASSESSMENTS - Focused Assessment []  - Circumferential Edema Measurements - multi extremities 0 []  - Nutritional Assessment / Counseling / Intervention 0 []  - Lower Extremity Assessment (monofilament, tuning fork, pulses) 0 []  - Peripheral Arterial Disease Assessment (using hand held doppler) 0 ASSESSMENTS - Ostomy and/or Continence Assessment and Care []  - Incontinence Assessment and Management 0 []  - Ostomy Care Assessment and Management (repouching, etc.) 0 PROCESS - Coordination of Care X - Simple Patient / Family Education for ongoing care 1 15 []  - Complex (extensive) Patient / Family Education for ongoing care 0 []  - Staff obtains Chiropractor, Records, Test Results / Process Orders 0 []  - Staff telephones HHA, Nursing Homes / Clarify orders / etc 0 []  - Routine Transfer to another Facility (non-emergent condition) 0 Garcia, [name]. ([id number]) []  - Routine Hospital Admission (non-emergent condition) 0 []  - New Admissions / Manufacturing engineer / Ordering NPWT, Apligraf, etc. 0 []  - Emergency Hospital Admission (emergent condition) 0 X - Simple Discharge Coordination 1 10 []  - Complex (extensive) Discharge Coordination 0 PROCESS - Special Needs []  - Pediatric / Minor Patient Management 0 []  - Isolation Patient Management 0 []  - Hearing / Language / Visual special needs 0 []  - Assessment of Community assistance (transportation, D/C planning, etc.) 0 []  - Additional assistance / Altered mentation 0 []  - Support Surface(s) Assessment (bed, cushion, seat, etc.) 0 INTERVENTIONS - Wound Cleansing / Measurement X -  Simple Wound Cleansing - one wound 1 5 []  - Complex Wound Cleansing - multiple wounds 0 X - Wound Imaging (photographs - any number of  wounds) 1 5 []  - Wound Tracing (instead of photographs) 0 X - Simple Wound Measurement - one wound 1 5 []  - Complex Wound Measurement - multiple wounds 0 INTERVENTIONS - Wound Dressings X - Small Wound Dressing one or multiple wounds 1 10 []  - Medium Wound Dressing one or multiple wounds 0 []  - Large Wound Dressing one or multiple wounds 0 []  - Application of Medications - topical 0 []  - Application of Medications - injection 0 INTERVENTIONS - Miscellaneous []  - External ear exam 0 Garcia, [name]. ([id number]) []  - Specimen Collection (cultures, biopsies, blood, body fluids, etc.) 0 []  - Specimen(s) / Culture(s) sent or taken to Lab for analysis 0 []  - Patient Transfer (multiple staff / Michiel Sites Lift / Similar devices) 0 []  - Simple Staple / Suture removal (25 or less) 0 []  - Complex Staple / Suture removal (26 or more) 0 []  - Hypo / Hyperglycemic Management (close monitor of Blood Glucose) 0 []  - Ankle / Brachial Index (ABI) - do not check if billed separately 0 X - Vital Signs 1 5 Has the patient been seen at the hospital within the last three years: Yes Total Score: 75 Level Of Care: New/Established - Level 2 Electronic Signature(s) Signed: 12/09/2016 5:09:52 PM By: Curtis Sites Entered By: Curtis Sites on 12/09/2016 14:11:34 Garcia, Maureen Aline ([id number]) -------------------------------------------------------------------------------- Encounter Discharge Information Details Patient Name: [name]. Date of Service: 12/09/2016 12:30 PM Medical Record Number: [id number] Patient Account Number: [id number] Date of Birth/Sex: June 10, 1941 (76 y.o. Female) Treating RN: Curtis Sites Primary Care Chriselda Leppert: Bethann Punches Other Clinician: Referring Diandre Merica: Bethann Punches Treating Allysa Governale/Extender: Linwood Dibbles, HOYT Weeks in Treatment:  2 Encounter Discharge Information Items Discharge Pain Level: 0 Discharge Condition: Stable Ambulatory Status: Ambulatory Discharge Destination: Home Transportation: Private Auto Accompanied By: self Schedule Follow-up Appointment: Yes Medication Reconciliation completed and provided to Patient/Care No Macsen Nuttall: Provided on Clinical Summary of Care: 12/09/2016 Form Type Recipient Paper Patient MS Electronic Signature(s) Signed: 12/09/2016 1:12:07 PM By: Gwenlyn Perking Entered By: Gwenlyn Perking on 12/09/2016 13:12:06 Luczak, Maureen Aline ([id number]) -------------------------------------------------------------------------------- Multi Wound Chart Details Patient Name: [name]. Date of Service: 12/09/2016 12:30 PM Medical Record Number: [id number] Patient Account Number: [id number] Date of Birth/Sex: 03-02-1941 (76 y.o. Female) Treating RN: Curtis Sites Primary Care Karina Lenderman: Bethann Punches Other Clinician: Referring Ardys Hataway: Bethann Punches Treating Corion Sherrod/Extender: Linwood Dibbles, HOYT Weeks in Treatment: 2 Vital Signs Height(in): 64 Pulse(bpm): 73 Weight(lbs): Blood Pressure 119/75 (mmHg): Body Mass Index(BMI): Temperature(F): 98.2 Respiratory Rate 16 (breaths/min): Photos: [1:No Photos] [N/A:N/A] Wound Location: [1:Right Upper Leg] [N/A:N/A] Wounding Event: [1:Bump] [N/A:N/A] Primary Etiology: [1:Abscess] [N/A:N/A] Comorbid History: [1:Cataracts, Hypertension, Osteoarthritis] [N/A:N/A] Date Acquired: [1:11/20/2016] [N/A:N/A] Weeks of Treatment: [1:2] [N/A:N/A] Wound Status: [1:Open] [N/A:N/A] Measurements L x W x D 1.6x1.8x0.3 [N/A:N/A] (cm) Area (cm) : [1:2.262] [N/A:N/A] Volume (cm) : [1:0.679] [N/A:N/A] % Reduction in Area: [1:58.90%] [N/A:N/A] % Reduction in Volume: 87.70% [N/A:N/A] Classification: [1:Full Thickness Without Exposed Support Structures] [N/A:N/A] Exudate Amount: [1:Large] [N/A:N/A] Exudate Type: [1:Serosanguineous] [N/A:N/A] Exudate  Color: [1:red, brown] [N/A:N/A] Wound Margin: [1:Indistinct, nonvisible] [N/A:N/A] Granulation Amount: [1:Large (67-100%)] [N/A:N/A] Granulation Quality: [1:Red] [N/A:N/A] Necrotic Amount: [1:Small (1-33%)] [N/A:N/A] Exposed Structures: [1:Fat Layer (Subcutaneous Tissue) Exposed: Yes Fascia: No Tendon: No Muscle: No] [N/A:N/A] Joint: No Bone: No Epithelialization: None N/A N/A Periwound Skin Texture: Induration: Yes N/A N/A Excoriation: No Callus: No Crepitus: No Rash: No Scarring: No Periwound Skin Maceration: No N/A N/A Moisture: Dry/Scaly: No Periwound Skin Color: Ecchymosis: Yes N/A N/A Atrophie Blanche: No Cyanosis: No  Erythema: No Hemosiderin Staining: No Mottled: No Pallor: No Rubor: No Tenderness on No N/A N/A Palpation: Wound Preparation: Ulcer Cleansing: N/A N/A Rinsed/Irrigated with Saline Topical Anesthetic Applied: Other: lidocaine 4% Treatment Notes Electronic Signature(s) Signed: 12/09/2016 5:09:52 PM By: Curtis Sites Entered By: Curtis Sites on 12/09/2016 12:59:40 Selvidge, Maureen Aline ([id number]) -------------------------------------------------------------------------------- Multi-Disciplinary Care Plan Details Patient Name: [name], [name]. Date of Service: 12/09/2016 12:30 PM Medical Record Number: [id number] Patient Account Number: [id number] Date of Birth/Sex: 1941/02/11 (76 y.o. Female) Treating RN: Curtis Sites Primary Care Shayden Gingrich: Bethann Punches Other Clinician: Referring Brendan Gruwell: Bethann Punches Treating Tynslee Bowlds/Extender: Linwood Dibbles, HOYT Weeks in Treatment: 2 Active Inactive ` Abuse / Safety / Falls / Self Care Management Nursing Diagnoses: Self care deficit: actual or potential Goals: Patient/caregiver will verbalize/demonstrate measure taken to improve self care Date Initiated: 11/25/2016 Target Resolution Date: 12/16/2016 Goal Status: Active Interventions: Assess self care needs on admission and as needed Treatment  Activities: Patient referred to home care : 11/25/2016 Notes: ` Necrotic Tissue Nursing Diagnoses: Impaired tissue integrity related to necrotic/devitalized tissue Goals: Patient/caregiver will verbalize understanding of reason and process for debridement of necrotic tissue Date Initiated: 11/25/2016 Target Resolution Date: 12/12/2016 Goal Status: Active Interventions: Assess patient pain level pre-, during and post procedure and prior to discharge Treatment Activities: Apply topical anesthetic as ordered : 11/25/2016 Notes: CHARONDA, HEFTER ([id number]) Orientation to the Wound Care Program Nursing Diagnoses: Knowledge deficit related to the wound healing center program Goals: Patient/caregiver will verbalize understanding of the Wound Healing Center Program Date Initiated: 11/25/2016 Target Resolution Date: 12/12/2016 Goal Status: Active Interventions: Provide education on orientation to the wound center Notes: ` Wound/Skin Impairment Nursing Diagnoses: Impaired tissue integrity Goals: Ulcer/skin breakdown will heal within 14 weeks Date Initiated: 11/25/2016 Target Resolution Date: 12/12/2016 Goal Status: Active Interventions: Assess ulceration(s) every visit Treatment Activities: Topical wound management initiated : 11/25/2016 Notes: Electronic Signature(s) Signed: 12/09/2016 5:09:52 PM By: Curtis Sites Entered By: Curtis Sites on 12/09/2016 12:59:28 Cressey, Maureen Aline ([id number]) -------------------------------------------------------------------------------- Pain Assessment Details Patient Name: [name]. Date of Service: 12/09/2016 12:30 PM Medical Record Number: [id number] Patient Account Number: [id number] Date of Birth/Sex: 01/18/1941 (76 y.o. Female) Treating RN: Curtis Sites Primary Care Macrae Wiegman: Bethann Punches Other Clinician: Referring Shavette Shoaff: Bethann Punches Treating Darris Staiger/Extender: Linwood Dibbles, HOYT Weeks in Treatment: 2 Active  Problems Location of Pain Severity and Description of Pain Patient Has Paino No Site Locations Pain Management and Medication Current Pain Management: Notes Topical or injectable lidocaine is offered to patient for acute pain when surgical debridement is performed. If needed, Patient is instructed to use over the counter pain medication for the following 24-48 hours after debridement. Wound care MDs do not prescribed pain medications. Patient has chronic pain or uncontrolled pain. Patient has been instructed to make an appointment with their Primary Care Physician for pain management Electronic Signature(s) Signed: 12/09/2016 5:09:52 PM By: Curtis Sites Entered By: Curtis Sites on 12/09/2016 12:41:22 [place], Maureen Aline ([id number]) -------------------------------------------------------------------------------- Patient/Caregiver Education Details Patient Name: [name]. Date of Service: 12/09/2016 12:30 PM Medical Record Number: [id number] Patient Account Number: [id number] Date of Birth/Gender: Sep 23, 1940 (76 y.o. Female) Treating RN: Curtis Sites Primary Care Physician: Bethann Punches Other Clinician: Referring Physician: Bethann Punches Treating Physician/Extender: Linwood Dibbles, HOYT Weeks in Treatment: 2 Education Assessment Education Provided To: Patient Education Topics Provided Wound/Skin Impairment: Handouts: Other: wound care as ordered Methods: Demonstration, Explain/Verbal Responses: State content correctly Electronic Signature(s) Signed: 12/09/2016 5:09:52 PM By: Curtis Sites Entered By: Curtis Sites on  12/09/2016 13:00:23 Moder, Maureen AlineMARTHA M. ([id number]) -------------------------------------------------------------------------------- Wound Assessment Details Patient Name: Maureen SierrasSHOFFNER, [name]. Date of Service: 12/09/2016 12:30 PM Medical Record Number: [id number] Patient Account Number: [id number] Date of Birth/Sex: 09/26/1940 (76 y.o. Female) Treating RN:  Curtis Sitesorthy, Joanna Primary Care Bernadine Melecio: Bethann PunchesMiller, Mark Other Clinician: Referring Meriel Kelliher: Bethann PunchesMiller, Mark Treating Tanazia Achee/Extender: Linwood DibblesSTONE III, HOYT Weeks in Treatment: 2 Wound Status Wound Number: 1 Primary Abscess Etiology: Wound Location: Right Upper Leg Wound Status: Open Wounding Event: Bump Comorbid Cataracts, Hypertension, Date Acquired: 11/20/2016 History: Osteoarthritis Weeks Of Treatment: 2 Clustered Wound: No Photos Photo Uploaded By: Curtis Sitesorthy, Joanna on 12/09/2016 14:01:48 Wound Measurements Length: (cm) 1.6 Width: (cm) 1.8 Depth: (cm) 0.3 Area: (cm) 2.262 Volume: (cm) 0.679 % Reduction in Area: 58.9% % Reduction in Volume: 87.7% Epithelialization: None Tunneling: No Undermining: No Wound Description Full Thickness Without Exposed Classification: Support Structures Wound Margin: Indistinct, nonvisible Exudate Large Amount: Exudate Type: Serosanguineous Exudate Color: red, brown Foul Odor After Cleansing: No Slough/Fibrino No Wound Bed Granulation Amount: Large (67-100%) Exposed Structure Granulation Quality: Red Fascia Exposed: No Necrotic Amount: Small (1-33%) Fat Layer (Subcutaneous Tissue) Exposed: Yes Garcia, Maureen Judie PetitM. ([id number]) Necrotic Quality: Adherent Slough Tendon Exposed: No Muscle Exposed: No Joint Exposed: No Bone Exposed: No Periwound Skin Texture Texture Color No Abnormalities Noted: No No Abnormalities Noted: No Callus: No Atrophie Blanche: No Crepitus: No Cyanosis: No Excoriation: No Ecchymosis: Yes Induration: Yes Erythema: No Rash: No Hemosiderin Staining: No Scarring: No Mottled: No Pallor: No Moisture Rubor: No No Abnormalities Noted: No Dry / Scaly: No Maceration: No Wound Preparation Ulcer Cleansing: Rinsed/Irrigated with Saline Topical Anesthetic Applied: Other: lidocaine 4%, Treatment Notes Wound #1 (Right Upper Leg) 1. Cleansed with: Clean wound with Normal Saline 2. Anesthetic Topical Lidocaine  4% cream to wound bed prior to debridement 3. Peri-wound Care: Skin Prep 4. Dressing Applied: Aquacel Ag 5. Secondary Dressing Applied Bordered Foam Dressing Dry Gauze Electronic Signature(s) Signed: 12/09/2016 5:09:52 PM By: Curtis Sitesorthy, Joanna Entered By: Curtis Sitesorthy, Joanna on 12/09/2016 12:49:26 Garcia, Maureen AlineMARTHA M. ([id number]) -------------------------------------------------------------------------------- Vitals Details Patient Name: Maureen SierrasSHOFFNER, [name] M. Date of Service: 12/09/2016 12:30 PM Medical Record Number: [id number] Patient Account Number: [id number] Date of Birth/Sex: 12/04/1940 (76 y.o. Female) Treating RN: Curtis Sitesorthy, Joanna Primary Care Riese Hellard: Bethann PunchesMiller, Mark Other Clinician: Referring Meridian Scherger: Bethann PunchesMiller, Mark Treating Shena Vinluan/Extender: Linwood DibblesSTONE III, HOYT Weeks in Treatment: 2 Vital Signs Time Taken: 12:42 Temperature (F): 98.2 Height (in): 64 Pulse (bpm): 73 Respiratory Rate (breaths/min): 16 Blood Pressure (mmHg): 119/75 Reference Range: 80 - 120 mg / dl Electronic Signature(s) Signed: 12/09/2016 5:09:52 PM By: Curtis Sitesorthy, Joanna Entered By: Curtis Sitesorthy, Joanna on 12/09/2016 12:43:35

## 2016-12-11 NOTE — Progress Notes (Signed)
[name] ([id number]) Visit Report for 12/09/2016 Chief Complaint Document Details Patient Name: [name], [name]. Date of Service: 12/09/2016 12:30 PM Medical Record Number: [id number] Patient Account Number: [id number] Date of Birth/Sex: 05-04-1941 (76 y.o. Female) Treating RN: Curtis Sites Primary Care Provider: Bethann Punches Other Clinician: Referring Provider: Bethann Punches Treating Provider/Extender: Linwood Dibbles, Chaos Carlile Weeks in Treatment: 2 Information Obtained from: Patient Chief Complaint Patient presents to the wound care center for a consult due non healing wound to the right upper thigh posteriorly just below her gluteal fold Electronic Signature(s) Signed: 12/09/2016 5:31:15 PM By: Lenda Kelp PA-C Entered By: Lenda Kelp on 12/09/2016 12:56:26 [name], [name] ([id number]) -------------------------------------------------------------------------------- HPI Details Patient Name: [name]. Date of Service: 12/09/2016 12:30 PM Medical Record Number: [id number] Patient Account Number: [id number] Date of Birth/Sex: 06-15-1941 (76 y.o. Female) Treating RN: Curtis Sites Primary Care Provider: Bethann Punches Other Clinician: Referring Provider: Bethann Punches Treating Provider/Extender: Linwood Dibbles, Breawna Montenegro Weeks in Treatment: 2 History of Present Illness Location: recent IandD for a indurated abscess on the right gluteal fold and upper thigh Quality: Patient reports experiencing a sharp pain to affected area(s). Severity: Patient states wound are getting worse. Duration: Patient has had the wound for < 1 week prior to presenting for treatment Timing: Pain in wound is constant (hurts all the time) Context: The wound would happen gradually Modifying Factors: Other treatment(s) tried include:antibiotics given to her by her PCP and IandD done in the ER Associated Signs and Symptoms: Patient reports having increase discharge. HPI Description: 76 year old  patient who was seen in the emergency department for abscess on the back of her right thigh. She had an abscess with erythema and purulent drainage. This was drained under local anesthesia and packed with 1/4 inch gauze. she received IV antibiotics -- vancomycin, while she was there Past medical history of arthritis, atrial fibrillation chronic kidney disease and status post right partial knee replacement. He has also had a cholecystectomy, eye surgery and tonsillectomy. She is a former smoker quit in 2000. 12/02/2016 -- the patient is in a much better frame of mind today and is accompanied by her daughter who has been helping her with her care 12/09/16 patient right gluteal wound in the upper thigh region appears to be doing very well on evaluation today. She has excellent granulation tissue and I'm pleased with how this seems to be progressing. There is no evidence of infection. Electronic Signature(s) Signed: 12/09/2016 5:31:15 PM By: Lenda Kelp PA-C Entered By: Lenda Kelp on 12/09/2016 13:06:49 [name], [name] ([id number]) -------------------------------------------------------------------------------- Physical Exam Details Patient Name: [name], [name]. Date of Service: 12/09/2016 12:30 PM Medical Record Number: [id number] Patient Account Number: [id number] Date of Birth/Sex: [birth year] (76 y.o. Female) Treating RN: Curtis Sites Primary Care Provider: Bethann Punches Other Clinician: Referring Provider: Bethann Punches Treating Provider/Extender: Linwood Dibbles, Aydian Dimmick Weeks in Treatment: 2 Constitutional Well-nourished and well-hydrated in no acute distress. Respiratory normal breathing without difficulty. clear to auscultation bilaterally. Cardiovascular regular rate and rhythm with normal S1, S2. Psychiatric this patient is able to make decisions and demonstrates good insight into disease process. Alert and Oriented x 3. pleasant and cooperative. Notes Patient has an  excellent granulation bed with no slough and requires no debridement at this point. She continues to be concerned about what calls this by explained that there really is no way to know unless someone actually visualizes an injury, bite, or folliculitis. Electronic Signature(s) Signed: 12/09/2016 5:31:15 PM By: Lenda Kelp PA-C  Entered By: Lenda KelpStone III, Rex Oesterle on 12/09/2016 13:07:23 Liss, Maureen AlineMARTHA M. ([id number]) -------------------------------------------------------------------------------- Physician Orders Details Patient Name: Maureen SierrasSHOFFNER, [name]. Date of Service: 12/09/2016 12:30 PM Medical Record Number: [id number] Patient Account Number: [id number] Date of Birth/Sex: 04/23/1941 (76 y.o. Female) Treating RN: Curtis Sitesorthy, Joanna Primary Care Provider: Bethann PunchesMiller, Mark Other Clinician: Referring Provider: Bethann PunchesMiller, Mark Treating Provider/Extender: Linwood DibblesSTONE III, Mossie Gilder Weeks in Treatment: 2 Verbal / Phone Orders: No Diagnosis Coding ICD-10 Coding Code Description S71.101A Unspecified open wound, right thigh, initial encounter L02.415 Cutaneous abscess of right lower limb Wound Cleansing Wound #1 Right Upper Leg o Clean wound with Normal Saline. Anesthetic Wound #1 Right Upper Leg o Topical Lidocaine 4% cream applied to wound bed prior to debridement Primary Wound Dressing Wound #1 Right Upper Leg o Aquacel Ag Secondary Dressing Wound #1 Right Upper Leg o Dry Gauze o Boardered Foam Dressing Dressing Change Frequency Wound #1 Right Upper Leg o Change Dressing Monday, Wednesday, Friday Follow-up Appointments Wound #1 Right Upper Leg o Return Appointment in 1 week. Home Health Wound #1 Right Upper Leg o Continue Home Health Visits o Home Health Nurse may visit PRN to address patientos wound care needs. Maureen SierrasSHOFFNER, [name]. ([id number]) o FACE TO FACE ENCOUNTER: MEDICARE and MEDICAID PATIENTS: I certify that this patient is under my care and that I had a face-to-face  encounter that meets the physician face-to-face encounter requirements with this patient on this date. The encounter with the patient was in whole or in part for the following MEDICAL CONDITION: (primary reason for Home Healthcare) MEDICAL NECESSITY: I certify, that based on my findings, NURSING services are a medically necessary home health service. HOME BOUND STATUS: I certify that my clinical findings support that this patient is homebound (i.e., Due to illness or injury, pt requires aid of supportive devices such as crutches, cane, wheelchairs, walkers, the use of special transportation or the assistance of another person to leave their place of residence. There is a normal inability to leave the home and doing so requires considerable and taxing effort. Other absences are for medical reasons / religious services and are infrequent or of short duration when for other reasons). o If current dressing causes regression in wound condition, may D/C ordered dressing product/s and apply Normal Saline Moist Dressing daily until next Wound Healing Center / Other MD appointment. Notify Wound Healing Center of regression in wound condition at [phone]. o Please direct any NON-WOUND related issues/requests for orders to patient's Primary Care Physician Electronic Signature(s) Signed: 12/09/2016 5:09:52 PM By: Curtis Sitesorthy, Joanna Signed: 12/09/2016 5:31:15 PM By: Lenda KelpStone III, Lincy Belles PA-C Entered By: Curtis Sitesorthy, Joanna on 12/09/2016 13:09:18 Challis, Maureen AlineMARTHA M. ([id number]) -------------------------------------------------------------------------------- Problem List Details Patient Name: Maureen SierrasSHOFFNER, [name]. Date of Service: 12/09/2016 12:30 PM Medical Record Number: [id number] Patient Account Number: [id number] Date of Birth/Sex: 11/08/1940 (76 y.o. Female) Treating RN: Curtis Sitesorthy, Joanna Primary Care Provider: Bethann PunchesMiller, Mark Other Clinician: Referring Provider: Bethann PunchesMiller, Mark Treating Provider/Extender: Linwood DibblesSTONE  III, Aizlyn Schifano Weeks in Treatment: 2 Active Problems ICD-10 Encounter Code Description Active Date Diagnosis S71.101A Unspecified open wound, right thigh, initial encounter 11/25/2016 Yes L02.415 Cutaneous abscess of right lower limb 11/25/2016 Yes Inactive Problems Resolved Problems Electronic Signature(s) Signed: 12/09/2016 5:31:15 PM By: Lenda KelpStone III, Shaneil Yazdi PA-C Entered By: Lenda KelpStone III, Khayman Kirsch on 12/09/2016 12:56:03 Bon, Maureen AlineMARTHA M. ([id number]) -------------------------------------------------------------------------------- Progress Note Details Patient Name: Maureen SierrasSHOFFNER, [name]. Date of Service: 12/09/2016 12:30 PM Medical Record Number: [id number] Patient Account Number: [id number] Date of Birth/Sex: 11/13/1940 (76 y.o. Female) Treating RN: Curtis Sitesorthy, Joanna Primary Care Provider:  Bethann Punches Other Clinician: Referring Provider: Bethann Punches Treating Provider/Extender: Linwood Dibbles, Rosella Crandell Weeks in Treatment: 2 Subjective Chief Complaint Information obtained from Patient Patient presents to the wound care center for a consult due non healing wound to the right upper thigh posteriorly just below her gluteal fold History of Present Illness (HPI) The following HPI elements were documented for the patient's wound: Location: recent IandD for a indurated abscess on the right gluteal fold and upper thigh Quality: Patient reports experiencing a sharp pain to affected area(s). Severity: Patient states wound are getting worse. Duration: Patient has had the wound for < 1 week prior to presenting for treatment Timing: Pain in wound is constant (hurts all the time) Context: The wound would happen gradually Modifying Factors: Other treatment(s) tried include:antibiotics given to her by her PCP and IandD done in the ER Associated Signs and Symptoms: Patient reports having increase discharge. 75 year old patient who was seen in the emergency department for abscess on the back of her right thigh. She had an  abscess with erythema and purulent drainage. This was drained under local anesthesia and packed with 1/4 inch gauze. she received IV antibiotics -- vancomycin, while she was there Past medical history of arthritis, atrial fibrillation chronic kidney disease and status post right partial knee replacement. He has also had a cholecystectomy, eye surgery and tonsillectomy. She is a former smoker quit in 2000. 12/02/2016 -- the patient is in a much better frame of mind today and is accompanied by her daughter who has been helping her with her care 12/09/16 patient right gluteal wound in the upper thigh region appears to be doing very well on evaluation today. She has excellent granulation tissue and I'm pleased with how this seems to be progressing. There is no evidence of infection. Objective Constitutional Keesling, [name]. ([id number]) Well-nourished and well-hydrated in no acute distress. Vitals Time Taken: 12:42 PM, Height: 64 in, Temperature: 98.2 F, Pulse: 73 bpm, Respiratory Rate: 16 breaths/min, Blood Pressure: 119/75 mmHg. Respiratory normal breathing without difficulty. clear to auscultation bilaterally. Cardiovascular regular rate and rhythm with normal S1, S2. Psychiatric this patient is able to make decisions and demonstrates good insight into disease process. Alert and Oriented x 3. pleasant and cooperative. General Notes: Patient has an excellent granulation bed with no slough and requires no debridement at this point. She continues to be concerned about what calls this by explained that there really is no way to know unless someone actually visualizes an injury, bite, or folliculitis. Integumentary (Hair, Skin) Wound #1 status is Open. Original cause of wound was Bump. The wound is located on the Right Upper Leg. The wound measures 1.6cm length x 1.8cm width x 0.3cm depth; 2.262cm^2 area and 0.679cm^3 volume. There is Fat Layer (Subcutaneous Tissue) Exposed exposed. There  is no tunneling or undermining noted. There is a large amount of serosanguineous drainage noted. The wound margin is indistinct and nonvisible. There is large (67-100%) red granulation within the wound bed. There is a small (1-33%) amount of necrotic tissue within the wound bed including Adherent Slough. The periwound skin appearance exhibited: Induration, Ecchymosis. The periwound skin appearance did not exhibit: Callus, Crepitus, Excoriation, Rash, Scarring, Dry/Scaly, Maceration, Atrophie Blanche, Cyanosis, Hemosiderin Staining, Mottled, Pallor, Rubor, Erythema. Assessment Active Problems ICD-10 S71.101A - Unspecified open wound, right thigh, initial encounter L02.415 - Cutaneous abscess of right lower limb Plan [name], [name]. ([id number]) Wound Cleansing: Wound #1 Right Upper Leg: Clean wound with Normal Saline. Anesthetic: Wound #1 Right Upper Leg: Topical  Lidocaine 4% cream applied to wound bed prior to debridement Primary Wound Dressing: Wound #1 Right Upper Leg: Aquacel Ag Secondary Dressing: Wound #1 Right Upper Leg: Dry Gauze Boardered Foam Dressing Dressing Change Frequency: Wound #1 Right Upper Leg: Change dressing every other day. Follow-up Appointments: Wound #1 Right Upper Leg: Return Appointment in 1 week. Home Health: Wound #1 Right Upper Leg: Continue Home Health Visits Home Health Nurse may visit PRN to address patient s wound care needs. FACE TO FACE ENCOUNTER: MEDICARE and MEDICAID PATIENTS: I certify that this patient is under my care and that I had a face-to-face encounter that meets the physician face-to-face encounter requirements with this patient on this date. The encounter with the patient was in whole or in part for the following MEDICAL CONDITION: (primary reason for Home Healthcare) MEDICAL NECESSITY: I certify, that based on my findings, NURSING services are a medically necessary home health service. HOME BOUND STATUS: I certify that my  clinical findings support that this patient is homebound (i.e., Due to illness or injury, pt requires aid of supportive devices such as crutches, cane, wheelchairs, walkers, the use of special transportation or the assistance of another person to leave their place of residence. There is a normal inability to leave the home and doing so requires considerable and taxing effort. Other absences are for medical reasons / religious services and are infrequent or of short duration when for other reasons). If current dressing causes regression in wound condition, may D/C ordered dressing product/s and apply Normal Saline Moist Dressing daily until next Wound Healing Center / Other MD appointment. Notify Wound Healing Center of regression in wound condition at [phone]. Please direct any NON-WOUND related issues/requests for orders to patient's Primary Care Physician General Notes: We are going to continue with the Current wound care measures for the next week. We will see her for fault evaluation at that point to see were things stand and if anything worsens in the interim she will contact our office for additional recommendations. Electronic Signature(s) Signed: 12/09/2016 5:31:15 PM By: Buren Kos Montilla, [name]. ([id number]) Entered By: Lenda Kelp on 12/09/2016 13:07:57 Heimann, [name] ([id number]) -------------------------------------------------------------------------------- SuperBill Details Patient Name: [name], [name]. Date of Service: 12/09/2016 Medical Record Number: [id number] Patient Account Number: [id number] Date of Birth/Sex: February 28, 1941 (76 y.o. Female) Treating RN: Curtis Sites Primary Care Provider: Bethann Punches Other Clinician: Referring Provider: Bethann Punches Treating Provider/Extender: Linwood Dibbles, Mykah Bellomo Weeks in Treatment: 2 Diagnosis Coding ICD-10 Codes Code Description S71.101A Unspecified open wound, right thigh, initial encounter L02.415  Cutaneous abscess of right lower limb Facility Procedures CPT4 Code: [id number] Description: [phone] - WOUND CARE VISIT-LEV 2 EST PT Modifier: Quantity: 1 Physician Procedures CPT4 Code: [id number] Description: 99213 - WC PHYS LEVEL 3 - EST PT ICD-10 Description Diagnosis S71.101A Unspecified open wound, right thigh, initial en L02.415 Cutaneous abscess of right lower limb Modifier: counter Quantity: 1 Electronic Signature(s) Signed: 12/09/2016 2:11:41 PM By: Curtis Sites Signed: 12/09/2016 5:31:15 PM By: Lenda Kelp PA-C Entered By: Curtis Sites on 12/09/2016 14:11:41

## 2016-12-16 ENCOUNTER — Encounter: Payer: Medicare Other | Admitting: Surgery

## 2016-12-16 DIAGNOSIS — S71101A Unspecified open wound, right thigh, initial encounter: Secondary | ICD-10-CM | POA: Diagnosis not present

## 2016-12-18 NOTE — Progress Notes (Signed)
AMIRRAH, QUIGLEY ([id number]) Visit Report for 12/16/2016 Chief Complaint Document Details Patient Name: [name], [name]. Date of Service: 12/16/2016 1:30 PM Medical Record Number: [id number] Patient Account Number: [id number] Date of Birth/Sex: Jun 10, 1941 (76 y.o. Female) Treating RN: Curtis Sites Primary Care Provider: Bethann Punches Other Clinician: Referring Provider: Bethann Punches Treating Provider/Extender: Rudene Re in Treatment: 3 Information Obtained from: Patient Chief Complaint Patient presents to the wound care center for a consult due non healing wound to the right upper thigh posteriorly just below her gluteal fold Electronic Signature(s) Signed: 12/16/2016 2:03:55 PM By: Evlyn Kanner MD, FACS Entered By: Evlyn Kanner on 12/16/2016 14:03:54 Boody, Kelby Aline ([id number]) -------------------------------------------------------------------------------- HPI Details Patient Name: [name]. Date of Service: 12/16/2016 1:30 PM Medical Record Number: [id number] Patient Account Number: [id number] Date of Birth/Sex: [birth year] (76 y.o. Female) Treating RN: Curtis Sites Primary Care Provider: Bethann Punches Other Clinician: Referring Provider: Bethann Punches Treating Provider/Extender: Rudene Re in Treatment: 3 History of Present Illness Location: recent IandD for a indurated abscess on the right gluteal fold and upper thigh Quality: Patient reports experiencing a sharp pain to affected area(s). Severity: Patient states wound are getting worse. Duration: Patient has had the wound for < 1 week prior to presenting for treatment Timing: Pain in wound is constant (hurts all the time) Context: The wound would happen gradually Modifying Factors: Other treatment(s) tried include:antibiotics given to her by her PCP and IandD done in the ER Associated Signs and Symptoms: Patient reports having increase discharge. HPI Description: 76 year old patient  who was seen in the emergency department for abscess on the back of her right thigh. She had an abscess with erythema and purulent drainage. This was drained under local anesthesia and packed with 1/4 inch gauze. she received IV antibiotics -- vancomycin, while she was there Past medical history of arthritis, atrial fibrillation chronic kidney disease and status post right partial knee replacement. He has also had a cholecystectomy, eye surgery and tonsillectomy. She is a former smoker quit in 2000. 12/02/2016 -- the patient is in a much better frame of mind today and is accompanied by her daughter who has been helping her with her care 12/09/16 patient right gluteal wound in the upper thigh region appears to be doing very well on evaluation today. She has excellent granulation tissue and I'm pleased with how this seems to be progressing. There is no evidence of infection. Electronic Signature(s) Signed: 12/16/2016 2:03:59 PM By: Evlyn Kanner MD, FACS Entered By: Evlyn Kanner on 12/16/2016 14:03:59 Loving, Kelby Aline ([id number]) -------------------------------------------------------------------------------- Physical Exam Details Patient Name: [name], [name]. Date of Service: 12/16/2016 1:30 PM Medical Record Number: [id number] Patient Account Number: [id number] Date of Birth/Sex: [birth year] (76 y.o. Female) Treating RN: Curtis Sites Primary Care Provider: Bethann Punches Other Clinician: Referring Provider: Bethann Punches Treating Provider/Extender: Rudene Re in Treatment: 3 Constitutional . Pulse regular. Respirations normal and unlabored. Afebrile. . Eyes Nonicteric. Reactive to light. Ears, Nose, Mouth, and Throat Lips, teeth, and gums WNL.Marland Kitchen Moist mucosa without lesions. Neck supple and nontender. No palpable supraclavicular or cervical adenopathy. Normal sized without goiter. Respiratory WNL. No retractions.. Breath sounds WNL, No rubs, rales, rhonchi, or  wheeze.. Cardiovascular Heart rhythm and rate regular, no murmur or gallop.. Pedal Pulses WNL. No clubbing, cyanosis or edema. Chest Breasts symmetical and no nipple discharge.. Breast tissue WNL, no masses, lumps, or tenderness.. Lymphatic No adneopathy. No adenopathy. No adenopathy. Musculoskeletal Adexa without tenderness or enlargement.. Digits and nails w/o clubbing, cyanosis, infection, petechiae,  ischemia, or inflammatory conditions.. Integumentary (Hair, Skin) No suspicious lesions. No crepitus or fluctuance. No peri-wound warmth or erythema. No masses.Marland Kitchen Psychiatric Judgement and insight Intact.. No evidence of depression, anxiety, or agitation.. Notes he has clean granulation tissue and the surrounding skin and subcutaneous tissues by supple and there is no evidence of cellulitis or induration Electronic Signature(s) Signed: 12/16/2016 2:04:25 PM By: Evlyn Kanner MD, FACS Entered By: Evlyn Kanner on 12/16/2016 14:04:25 Pollak, Kelby Aline ([id number]) -------------------------------------------------------------------------------- Physician Orders Details Patient Name: [name]. Date of Service: 12/16/2016 1:30 PM Medical Record Number: [id number] Patient Account Number: [id number] Date of Birth/Sex: 1940/07/22 (76 y.o. Female) Treating RN: Curtis Sites Primary Care Provider: Bethann Punches Other Clinician: Referring Provider: Bethann Punches Treating Provider/Extender: Rudene Re in Treatment: 3 Verbal / Phone Orders: No Diagnosis Coding Wound Cleansing Wound #1 Right Upper Leg o Clean wound with Normal Saline. Anesthetic Wound #1 Right Upper Leg o Topical Lidocaine 4% cream applied to wound bed prior to debridement Primary Wound Dressing Wound #1 Right Upper Leg o Hydrafera Blue Secondary Dressing Wound #1 Right Upper Leg o Dry Gauze o Boardered Foam Dressing Dressing Change Frequency Wound #1 Right Upper Leg o Change Dressing  Monday, Wednesday, Friday Follow-up Appointments Wound #1 Right Upper Leg o Return Appointment in 1 week. Home Health Wound #1 Right Upper Leg o Continue Home Health Visits o Home Health Nurse may visit PRN to address patientos wound care needs. o FACE TO FACE ENCOUNTER: MEDICARE and MEDICAID PATIENTS: I certify that this patient is under my care and that I had a face-to-face encounter that meets the physician face-to-face encounter requirements with this patient on this date. The encounter with the patient was in whole or in part for the following MEDICAL CONDITION: (primary reason for Home Healthcare) MEDICAL NECESSITY: I certify, that based on my findings, NURSING services are a medically necessary home health service. HOME BOUND STATUS: I certify that my clinical findings Postle, ROIZY HAROLD. ([id number]) support that this patient is homebound (i.e., Due to illness or injury, pt requires aid of supportive devices such as crutches, cane, wheelchairs, walkers, the use of special transportation or the assistance of another person to leave their place of residence. There is a normal inability to leave the home and doing so requires considerable and taxing effort. Other absences are for medical reasons / religious services and are infrequent or of short duration when for other reasons). o If current dressing causes regression in wound condition, may D/C ordered dressing product/s and apply Normal Saline Moist Dressing daily until next Wound Healing Center / Other MD appointment. Notify Wound Healing Center of regression in wound condition at [phone]. o Please direct any NON-WOUND related issues/requests for orders to patient's Primary Care Physician Electronic Signature(s) Signed: 12/16/2016 4:14:17 PM By: Evlyn Kanner MD, FACS Signed: 12/16/2016 4:59:56 PM By: Curtis Sites Entered By: Curtis Sites on 12/16/2016 13:54:06 Schnitzler, Kelby Aline  ([id number]) -------------------------------------------------------------------------------- Problem List Details Patient Name: HANNALEE, CASTOR. Date of Service: 12/16/2016 1:30 PM Medical Record Number: [id number] Patient Account Number: [id number] Date of Birth/Sex: 1941-04-14 (76 y.o. Female) Treating RN: Curtis Sites Primary Care Provider: Bethann Punches Other Clinician: Referring Provider: Bethann Punches Treating Provider/Extender: Rudene Re in Treatment: 3 Active Problems ICD-10 Encounter Code Description Active Date Diagnosis S71.101A Unspecified open wound, right thigh, initial encounter 11/25/2016 Yes L02.415 Cutaneous abscess of right lower limb 11/25/2016 Yes Inactive Problems Resolved Problems Electronic Signature(s) Signed: 12/16/2016 2:03:45 PM By: Evlyn Kanner MD, FACS Entered By: Meyer Russel  Amador Braddy on 12/16/2016 14:03:44 Kama, Kelby Aline ([id number]) -------------------------------------------------------------------------------- Progress Note Details Patient Name: BRYNA, RAZAVI. Date of Service: 12/16/2016 1:30 PM Medical Record Number: [id number] Patient Account Number: [id number] Date of Birth/Sex: 03-05-1941 (76 y.o. Female) Treating RN: Curtis Sites Primary Care Provider: Bethann Punches Other Clinician: Referring Provider: Bethann Punches Treating Provider/Extender: Rudene Re in Treatment: 3 Subjective Chief Complaint Information obtained from Patient Patient presents to the wound care center for a consult due non healing wound to the right upper thigh posteriorly just below her gluteal fold History of Present Illness (HPI) The following HPI elements were documented for the patient's wound: Location: recent IandD for a indurated abscess on the right gluteal fold and upper thigh Quality: Patient reports experiencing a sharp pain to affected area(s). Severity: Patient states wound are getting worse. Duration: Patient has had the wound for <  1 week prior to presenting for treatment Timing: Pain in wound is constant (hurts all the time) Context: The wound would happen gradually Modifying Factors: Other treatment(s) tried include:antibiotics given to her by her PCP and IandD done in the ER Associated Signs and Symptoms: Patient reports having increase discharge. 76 year old patient who was seen in the emergency department for abscess on the back of her right thigh. She had an abscess with erythema and purulent drainage. This was drained under local anesthesia and packed with 1/4 inch gauze. she received IV antibiotics -- vancomycin, while she was there Past medical history of arthritis, atrial fibrillation chronic kidney disease and status post right partial knee replacement. He has also had a cholecystectomy, eye surgery and tonsillectomy. She is a former smoker quit in 2000. 12/02/2016 -- the patient is in a much better frame of mind today and is accompanied by her daughter who has been helping her with her care 12/09/16 patient right gluteal wound in the upper thigh region appears to be doing very well on evaluation today. She has excellent granulation tissue and I'm pleased with how this seems to be progressing. There is no evidence of infection. DEAUNA, YAW. ([id number]) Objective Constitutional Pulse regular. Respirations normal and unlabored. Afebrile. Vitals Time Taken: 1:36 PM, Height: 64 in, Temperature: 98.3 F, Pulse: 64 bpm, Respiratory Rate: 16 breaths/min, Blood Pressure: 144/75 mmHg. Eyes Nonicteric. Reactive to light. Ears, Nose, Mouth, and Throat Lips, teeth, and gums WNL.Marland Kitchen Moist mucosa without lesions. Neck supple and nontender. No palpable supraclavicular or cervical adenopathy. Normal sized without goiter. Respiratory WNL. No retractions.. Breath sounds WNL, No rubs, rales, rhonchi, or wheeze.. Cardiovascular Heart rhythm and rate regular, no murmur or gallop.. Pedal Pulses WNL. No clubbing,  cyanosis or edema. Chest Breasts symmetical and no nipple discharge.. Breast tissue WNL, no masses, lumps, or tenderness.. Lymphatic No adneopathy. No adenopathy. No adenopathy. Musculoskeletal Adexa without tenderness or enlargement.. Digits and nails w/o clubbing, cyanosis, infection, petechiae, ischemia, or inflammatory conditions.Marland Kitchen Psychiatric Judgement and insight Intact.. No evidence of depression, anxiety, or agitation.. General Notes: he has clean granulation tissue and the surrounding skin and subcutaneous tissues by supple and there is no evidence of cellulitis or induration Integumentary (Hair, Skin) No suspicious lesions. No crepitus or fluctuance. No peri-wound warmth or erythema. No masses.. Wound #1 status is Open. Original cause of wound was Bump. The wound is located on the Right Upper Leg. The wound measures 0.8cm length x 1.5cm width x 0.1cm depth; 0.942cm^2 area and 0.094cm^3 volume. There is Fat Layer (Subcutaneous Tissue) Exposed exposed. There is no tunneling or undermining noted. There is a large amount  of serosanguineous drainage noted. The wound margin is indistinct and nonvisible. There is large (67-100%) red granulation within the wound bed. There is no necrotic tissue Hodes, Darla M. ([id number]) within the wound bed. The periwound skin appearance exhibited: Induration, Ecchymosis. The periwound skin appearance did not exhibit: Callus, Crepitus, Excoriation, Rash, Scarring, Dry/Scaly, Maceration, Atrophie Blanche, Cyanosis, Hemosiderin Staining, Mottled, Pallor, Rubor, Erythema. Assessment Active Problems ICD-10 S71.101A - Unspecified open wound, right thigh, initial encounter L02.415 - Cutaneous abscess of right lower limb Plan Wound Cleansing: Wound #1 Right Upper Leg: Clean wound with Normal Saline. Anesthetic: Wound #1 Right Upper Leg: Topical Lidocaine 4% cream applied to wound bed prior to debridement Primary Wound Dressing: Wound #1 Right  Upper Leg: Hydrafera Blue Secondary Dressing: Wound #1 Right Upper Leg: Dry Gauze Boardered Foam Dressing Dressing Change Frequency: Wound #1 Right Upper Leg: Change Dressing Monday, Wednesday, Friday Follow-up Appointments: Wound #1 Right Upper Leg: Return Appointment in 1 week. Home Health: Wound #1 Right Upper Leg: Continue Home Health Visits Home Health Nurse may visit PRN to address patient s wound care needs. FACE TO FACE ENCOUNTER: MEDICARE and MEDICAID PATIENTS: I certify that this patient is under my care and that I had a face-to-face encounter that meets the physician face-to-face encounter requirements with this patient on this date. The encounter with the patient was in whole or in part for the following MEDICAL CONDITION: (primary reason for Home Healthcare) MEDICAL NECESSITY: I certify, that based on my findings, NURSING services are a medically necessary home health service. HOME Requena, Kelby AlineMARTHA M. ([id number]) BOUND STATUS: I certify that my clinical findings support that this patient is homebound (i.e., Due to illness or injury, pt requires aid of supportive devices such as crutches, cane, wheelchairs, walkers, the use of special transportation or the assistance of another person to leave their place of residence. There is a normal inability to leave the home and doing so requires considerable and taxing effort. Other absences are for medical reasons / religious services and are infrequent or of short duration when for other reasons). If current dressing causes regression in wound condition, may D/C ordered dressing product/s and apply Normal Saline Moist Dressing daily until next Wound Healing Center / Other MD appointment. Notify Wound Healing Center of regression in wound condition at [phone]. Please direct any NON-WOUND related issues/requests for orders to patient's Primary Care Physician the patient is having a lot of problems doing her dressing and I have  recommended we change her over to [facility] Hightsydrofera Blue and aborted for him to be changed every 3 days or so Electronic Signature(s) Signed: 12/16/2016 2:04:54 PM By: Evlyn KannerBritto, Kawana Hegel MD, FACS Entered By: Evlyn KannerBritto, Bowyn Mercier on 12/16/2016 14:04:53 Ruscitti, Kelby AlineMARTHA M. ([id number]) -------------------------------------------------------------------------------- SuperBill Details Patient Name: Maureen SierrasSHOFFNER, Deyonna M. Date of Service: 12/16/2016 Medical Record Number: [id number] Patient Account Number: [id number] Date of Birth/Sex: 12/13/1940 (76 y.o. Female) Treating RN: Curtis Sitesorthy, Joanna Primary Care Provider: Bethann PunchesMiller, Mark Other Clinician: Referring Provider: Bethann PunchesMiller, Mark Treating Provider/Extender: Rudene ReBritto, Jomarion Mish Weeks in Treatment: 3 Diagnosis Coding ICD-10 Codes Code Description S71.101A Unspecified open wound, right thigh, initial encounter L02.415 Cutaneous abscess of right lower limb Facility Procedures CPT4 Code: [id number] Description: [id number] - WOUND CARE VISIT-LEV 2 EST PT Modifier: Quantity: 1 Physician Procedures CPT4 Code: [id number] Description: 99213 - WC PHYS LEVEL 3 - EST PT ICD-10 Description Diagnosis S71.101A Unspecified open wound, right thigh, initial en L02.415 Cutaneous abscess of right lower limb Modifier: counter Quantity: 1 Electronic Signature(s) Signed: 12/16/2016 2:12:23 PM By: Curtis Sitesorthy, Joanna Signed: 12/16/2016  4:14:17 PM By: Evlyn Kanner MD, FACS Previous Signature: 12/16/2016 2:05:05 PM Version By: Evlyn Kanner MD, FACS Entered By: Curtis Sites on 12/16/2016 14:12:23

## 2016-12-18 NOTE — Progress Notes (Signed)
ANNELY, SLIVA ([id number]) Visit Report for 12/16/2016 Arrival Information Details Patient Name: TAIMA, RADA. Date of Service: 12/16/2016 1:30 PM Medical Record Number: [id number] Patient Account Number: [id number] Date of Birth/Sex: 02/04/1941 (76 y.o. Female) Treating RN: Curtis Sites Primary Care Casanova Schurman: Bethann Punches Other Clinician: Referring Topher Buenaventura: Bethann Punches Treating Xyla Leisner/Extender: Rudene Re in Treatment: 3 Visit Information History Since Last Visit Added or deleted any medications: No Patient Arrived: Ambulatory Any new allergies or adverse reactions: No Arrival Time: 13:35 Had a fall or experienced change in No Accompanied By: self activities of daily living that may affect Transfer Assistance: None risk of falls: Patient Identification Verified: Yes Signs or symptoms of abuse/neglect since last No Secondary Verification Process Yes visito Completed: Hospitalized since last visit: No Patient Has Alerts: Yes Has Dressing in Place as Prescribed: Yes Patient Alerts: Patient on Blood Pain Present Now: No Thinner aspirin 81 mg Electronic Signature(s) Signed: 12/16/2016 4:59:56 PM By: Curtis Sites Entered By: Curtis Sites on 12/16/2016 13:35:43 Oxendine, Kelby Aline ([id number]) -------------------------------------------------------------------------------- Clinic Level of Care Assessment Details Patient Name: [name]. Date of Service: 12/16/2016 1:30 PM Medical Record Number: [id number] Patient Account Number: [id number] Date of Birth/Sex: [birth year] (76 y.o. Female) Treating RN: Curtis Sites Primary Care Tine Mabee: Bethann Punches Other Clinician: Referring Desmin Daleo: Bethann Punches Treating Chaz Ronning/Extender: Rudene Re in Treatment: 3 Clinic Level of Care Assessment Items TOOL 4 Quantity Score []  - Use when only an EandM is performed on FOLLOW-UP visit 0 ASSESSMENTS - Nursing Assessment / Reassessment X -  Reassessment of Co-morbidities (includes updates in patient status) 1 10 X - Reassessment of Adherence to Treatment Plan 1 5 ASSESSMENTS - Wound and Skin Assessment / Reassessment X - Simple Wound Assessment / Reassessment - one wound 1 5 []  - Complex Wound Assessment / Reassessment - multiple wounds 0 []  - Dermatologic / Skin Assessment (not related to wound area) 0 ASSESSMENTS - Focused Assessment []  - Circumferential Edema Measurements - multi extremities 0 []  - Nutritional Assessment / Counseling / Intervention 0 []  - Lower Extremity Assessment (monofilament, tuning fork, pulses) 0 []  - Peripheral Arterial Disease Assessment (using hand held doppler) 0 ASSESSMENTS - Ostomy and/or Continence Assessment and Care []  - Incontinence Assessment and Management 0 []  - Ostomy Care Assessment and Management (repouching, etc.) 0 PROCESS - Coordination of Care X - Simple Patient / Family Education for ongoing care 1 15 []  - Complex (extensive) Patient / Family Education for ongoing care 0 []  - Staff obtains Chiropractor, Records, Test Results / Process Orders 0 []  - Staff telephones HHA, Nursing Homes / Clarify orders / etc 0 []  - Routine Transfer to another Facility (non-emergent condition) 0 Sparano, Jazzlynn M. ([id number]) []  - Routine Hospital Admission (non-emergent condition) 0 []  - New Admissions / Manufacturing engineer / Ordering NPWT, Apligraf, etc. 0 []  - Emergency Hospital Admission (emergent condition) 0 X - Simple Discharge Coordination 1 10 []  - Complex (extensive) Discharge Coordination 0 PROCESS - Special Needs []  - Pediatric / Minor Patient Management 0 []  - Isolation Patient Management 0 []  - Hearing / Language / Visual special needs 0 []  - Assessment of Community assistance (transportation, D/C planning, etc.) 0 []  - Additional assistance / Altered mentation 0 []  - Support Surface(s) Assessment (bed, cushion, seat, etc.) 0 INTERVENTIONS - Wound Cleansing / Measurement X -  Simple Wound Cleansing - one wound 1 5 []  - Complex Wound Cleansing - multiple wounds 0 X - Wound Imaging (photographs - any number of wounds) 1  5 []  - Wound Tracing (instead of photographs) 0 X - Simple Wound Measurement - one wound 1 5 []  - Complex Wound Measurement - multiple wounds 0 INTERVENTIONS - Wound Dressings X - Small Wound Dressing one or multiple wounds 1 10 []  - Medium Wound Dressing one or multiple wounds 0 []  - Large Wound Dressing one or multiple wounds 0 []  - Application of Medications - topical 0 []  - Application of Medications - injection 0 INTERVENTIONS - Miscellaneous []  - External ear exam 0 Broady, Achol M. ([id number]) []  - Specimen Collection (cultures, biopsies, blood, body fluids, etc.) 0 []  - Specimen(s) / Culture(s) sent or taken to Lab for analysis 0 []  - Patient Transfer (multiple staff / Michiel SitesHoyer Lift / Similar devices) 0 []  - Simple Staple / Suture removal (25 or less) 0 []  - Complex Staple / Suture removal (26 or more) 0 []  - Hypo / Hyperglycemic Management (close monitor of Blood Glucose) 0 []  - Ankle / Brachial Index (ABI) - do not check if billed separately 0 X - Vital Signs 1 5 Has the patient been seen at the hospital within the last three years: Yes Total Score: 75 Level Of Care: New/Established - Level 2 Electronic Signature(s) Signed: 12/16/2016 4:59:56 PM By: Curtis Sitesorthy, Joanna Entered By: Curtis Sitesorthy, Joanna on 12/16/2016 14:12:14 Szeto, Kelby AlineMARTHA M. ([id number]) -------------------------------------------------------------------------------- Encounter Discharge Information Details Patient Name: [name], Mayerli M. Date of Service: 12/16/2016 1:30 PM Medical Record Number: [id number] Patient Account Number: [id number] Date of Birth/Sex: 06/03/1941 (76 y.o. Female) Treating RN: Curtis Sitesorthy, Joanna Primary Care Aceson Labell: Bethann PunchesMiller, Mark Other Clinician: Referring Kieren Ricci: Bethann PunchesMiller, Mark Treating Darrik Richman/Extender: Rudene ReBritto, Errol Weeks in Treatment:  3 Encounter Discharge Information Items Discharge Pain Level: 0 Discharge Condition: Stable Ambulatory Status: Ambulatory Discharge Destination: Home Transportation: Private Auto Accompanied By: self Schedule Follow-up Appointment: Yes Medication Reconciliation completed and provided to Patient/Care No Kelena Garrow: Provided on Clinical Summary of Care: 12/16/2016 Form Type Recipient Paper Patient MS Electronic Signature(s) Signed: 12/16/2016 2:13:16 PM By: Curtis Sitesorthy, Joanna Previous Signature: 12/16/2016 2:00:59 PM Version By: Gwenlyn PerkingMoore, Shelia Entered By: Curtis Sitesorthy, Joanna on 12/16/2016 14:13:15 Nabor, Kelby AlineMARTHA M. ([id number]) -------------------------------------------------------------------------------- Multi Wound Chart Details Patient Name: [name], Manar M. Date of Service: 12/16/2016 1:30 PM Medical Record Number: [id number] Patient Account Number: [id number] Date of Birth/Sex: 09/23/1940 (76 y.o. Female) Treating RN: Curtis Sitesorthy, Joanna Primary Care Breahna Boylen: Bethann PunchesMiller, Mark Other Clinician: Referring Alaina Donati: Bethann PunchesMiller, Mark Treating Skila Rollins/Extender: Rudene ReBritto, Errol Weeks in Treatment: 3 Vital Signs Height(in): 64 Pulse(bpm): 64 Weight(lbs): Blood Pressure 144/75 (mmHg): Body Mass Index(BMI): Temperature(F): 98.3 Respiratory Rate 16 (breaths/min): Photos: [N/A:N/A] Wound Location: Right Upper Leg N/A N/A Wounding Event: Bump N/A N/A Primary Etiology: Abscess N/A N/A Comorbid History: Cataracts, Hypertension, N/A N/A Osteoarthritis Date Acquired: 11/20/2016 N/A N/A Weeks of Treatment: 3 N/A N/A Wound Status: Open N/A N/A Measurements L x W x D 0.8x1.5x0.1 N/A N/A (cm) Area (cm) : 0.942 N/A N/A Volume (cm) : 0.094 N/A N/A % Reduction in Area: 82.90% N/A N/A % Reduction in Volume: 98.30% N/A N/A Classification: Full Thickness Without N/A N/A Exposed Support Structures Exudate Amount: Large N/A N/A Exudate Type: Serosanguineous N/A N/A Exudate Color: red, brown N/A  N/A Wound Margin: Indistinct, nonvisible N/A N/A Granulation Amount: Large (67-100%) N/A N/A Granulation Quality: Red N/A N/A Dirico, Lougenia M. ([id number]) Necrotic Amount: None Present (0%) N/A N/A Exposed Structures: Fat Layer (Subcutaneous N/A N/A Tissue) Exposed: Yes Fascia: No Tendon: No Muscle: No Joint: No Bone: No Epithelialization: None N/A N/A Periwound Skin Texture: Induration: Yes N/A N/A Excoriation: No  Callus: No Crepitus: No Rash: No Scarring: No Periwound Skin Maceration: No N/A N/A Moisture: Dry/Scaly: No Periwound Skin Color: Ecchymosis: Yes N/A N/A Atrophie Blanche: No Cyanosis: No Erythema: No Hemosiderin Staining: No Mottled: No Pallor: No Rubor: No Tenderness on No N/A N/A Palpation: Wound Preparation: Ulcer Cleansing: N/A N/A Rinsed/Irrigated with Saline Topical Anesthetic Applied: Other: lidocaine 4% Treatment Notes Electronic Signature(s) Signed: 12/16/2016 2:03:49 PM By: Evlyn Kanner MD, FACS Entered By: Evlyn Kanner on 12/16/2016 14:03:49 Schnoor, Kelby Aline ([id number]) -------------------------------------------------------------------------------- Multi-Disciplinary Care Plan Details Patient Name: CONNI, KNIGHTON. Date of Service: 12/16/2016 1:30 PM Medical Record Number: [id number] Patient Account Number: [id number] Date of Birth/Sex: Oct 03, 1940 (76 y.o. Female) Treating RN: Curtis Sites Primary Care Lylee Corrow: Bethann Punches Other Clinician: Referring Taven Strite: Bethann Punches Treating Ayleen Mckinstry/Extender: Rudene Re in Treatment: 3 Active Inactive ` Abuse / Safety / Falls / Self Care Management Nursing Diagnoses: Self care deficit: actual or potential Goals: Patient/caregiver will verbalize/demonstrate measure taken to improve self care Date Initiated: 11/25/2016 Target Resolution Date: 12/16/2016 Goal Status: Active Interventions: Assess self care needs on admission and as needed Treatment Activities: Patient  referred to home care : 11/25/2016 Notes: ` Necrotic Tissue Nursing Diagnoses: Impaired tissue integrity related to necrotic/devitalized tissue Goals: Patient/caregiver will verbalize understanding of reason and process for debridement of necrotic tissue Date Initiated: 11/25/2016 Target Resolution Date: 12/12/2016 Goal Status: Active Interventions: Assess patient pain level pre-, during and post procedure and prior to discharge Treatment Activities: Apply topical anesthetic as ordered : 11/25/2016 Notes: CHER, FRANZONI ([id number]) Orientation to the Wound Care Program Nursing Diagnoses: Knowledge deficit related to the wound healing center program Goals: Patient/caregiver will verbalize understanding of the Wound Healing Center Program Date Initiated: 11/25/2016 Target Resolution Date: 12/12/2016 Goal Status: Active Interventions: Provide education on orientation to the wound center Notes: ` Wound/Skin Impairment Nursing Diagnoses: Impaired tissue integrity Goals: Ulcer/skin breakdown will heal within 14 weeks Date Initiated: 11/25/2016 Target Resolution Date: 12/12/2016 Goal Status: Active Interventions: Assess ulceration(s) every visit Treatment Activities: Topical wound management initiated : 11/25/2016 Notes: Electronic Signature(s) Signed: 12/16/2016 4:59:56 PM By: Curtis Sites Entered By: Curtis Sites on 12/16/2016 13:52:37 Dunton, Kelby Aline ([id number]) -------------------------------------------------------------------------------- Pain Assessment Details Patient Name: [name]. Date of Service: 12/16/2016 1:30 PM Medical Record Number: [id number] Patient Account Number: [id number] Date of Birth/Sex: 10/11/1940 (76 y.o. Female) Treating RN: Curtis Sites Primary Care Syrus Nakama: Bethann Punches Other Clinician: Referring Horice Carrero: Bethann Punches Treating Ritaj Dullea/Extender: Rudene Re in Treatment: 3 Active Problems Location of Pain Severity  and Description of Pain Patient Has Paino No Site Locations Pain Management and Medication Current Pain Management: Notes Topical or injectable lidocaine is offered to patient for acute pain when surgical debridement is performed. If needed, Patient is instructed to use over the counter pain medication for the following 24-48 hours after debridement. Wound care MDs do not prescribed pain medications. Patient has chronic pain or uncontrolled pain. Patient has been instructed to make an appointment with their Primary Care Physician for pain management. Electronic Signature(s) Signed: 12/16/2016 4:59:56 PM By: Curtis Sites Entered By: Curtis Sites on 12/16/2016 13:35:50 Hou, Kelby Aline ([id number]) -------------------------------------------------------------------------------- Patient/Caregiver Education Details Patient Name: [name]. Date of Service: 12/16/2016 1:30 PM Medical Record Number: [id number] Patient Account Number: [id number] Date of Birth/Gender: 04/23/1941 (76 y.o. Female) Treating RN: Curtis Sites Primary Care Physician: Bethann Punches Other Clinician: Referring Physician: Bethann Punches Treating Physician/Extender: Rudene Re in Treatment: 3 Education Assessment Education Provided To: Patient Education Topics Provided Wound/Skin  Impairment: Handouts: Other: wound care as ordered Methods: Demonstration, Explain/Verbal Responses: State content correctly Electronic Signature(s) Signed: 12/16/2016 4:59:56 PM By: Curtis Sites Entered By: Curtis Sites on 12/16/2016 14:13:31 Gadway, Kelby Aline ([id number]) -------------------------------------------------------------------------------- Wound Assessment Details Patient Name: [name]. Date of Service: 12/16/2016 1:30 PM Medical Record Number: [id number] Patient Account Number: [id number] Date of Birth/Sex: 09/18/1940 (76 y.o. Female) Treating RN: Curtis Sites Primary Care Tephanie Escorcia:  Bethann Punches Other Clinician: Referring Mayleigh Tetrault: Bethann Punches Treating Temia Debroux/Extender: Rudene Re in Treatment: 3 Wound Status Wound Number: 1 Primary Abscess Etiology: Wound Location: Right Upper Leg Wound Status: Open Wounding Event: Bump Comorbid Cataracts, Hypertension, Date Acquired: 11/20/2016 History: Osteoarthritis Weeks Of Treatment: 3 Clustered Wound: No Photos Wound Measurements Length: (cm) 0.8 Width: (cm) 1.5 Depth: (cm) 0.1 Area: (cm) 0.942 Volume: (cm) 0.094 % Reduction in Area: 82.9% % Reduction in Volume: 98.3% Epithelialization: None Tunneling: No Undermining: No Wound Description Full Thickness Without Exposed Classification: Support Structures Wound Margin: Indistinct, nonvisible Exudate Large Amount: Exudate Type: Serosanguineous Exudate Color: red, brown Foul Odor After Cleansing: No Slough/Fibrino No Wound Bed Granulation Amount: Large (67-100%) Exposed Structure Granulation Quality: Red Fascia Exposed: No Necrotic Amount: None Present (0%) Fat Layer (Subcutaneous Tissue) Exposed: Yes Fazekas, Adalaide M. ([id number]) Tendon Exposed: No Muscle Exposed: No Joint Exposed: No Bone Exposed: No Periwound Skin Texture Texture Color No Abnormalities Noted: No No Abnormalities Noted: No Callus: No Atrophie Blanche: No Crepitus: No Cyanosis: No Excoriation: No Ecchymosis: Yes Induration: Yes Erythema: No Rash: No Hemosiderin Staining: No Scarring: No Mottled: No Pallor: No Moisture Rubor: No No Abnormalities Noted: No Dry / Scaly: No Maceration: No Wound Preparation Ulcer Cleansing: Rinsed/Irrigated with Saline Topical Anesthetic Applied: Other: lidocaine 4%, Treatment Notes Wound #1 (Right Upper Leg) 1. Cleansed with: Clean wound with Normal Saline 2. Anesthetic Topical Lidocaine 4% cream to wound bed prior to debridement 4. Dressing Applied: Hydrafera Blue 5. Secondary Dressing Applied Bordered Foam  Dressing Electronic Signature(s) Signed: 12/16/2016 4:59:56 PM By: Curtis Sites Entered By: Curtis Sites on 12/16/2016 13:53:14 Keep, Kelby Aline ([id number]) -------------------------------------------------------------------------------- Vitals Details Patient Name: [name]. Date of Service: 12/16/2016 1:30 PM Medical Record Number: [id number] Patient Account Number: [id number] Date of Birth/Sex: [birth year] (76 y.o. Female) Treating RN: Curtis Sites Primary Care Savas Elvin: Bethann Punches Other Clinician: Referring Loreen Bankson: Bethann Punches Treating Calayah Guadarrama/Extender: Rudene Re in Treatment: 3 Vital Signs Time Taken: 13:36 Temperature (F): 98.3 Height (in): 64 Pulse (bpm): 64 Respiratory Rate (breaths/min): 16 Blood Pressure (mmHg): 144/75 Reference Range: 80 - 120 mg / dl Electronic Signature(s) Signed: 12/16/2016 4:59:56 PM By: Curtis Sites Entered By: Curtis Sites on 12/16/2016 13:37:11

## 2016-12-23 ENCOUNTER — Encounter: Payer: Medicare Other | Attending: Physician Assistant | Admitting: Physician Assistant

## 2016-12-23 DIAGNOSIS — Z79899 Other long term (current) drug therapy: Secondary | ICD-10-CM | POA: Insufficient documentation

## 2016-12-23 DIAGNOSIS — X58XXXA Exposure to other specified factors, initial encounter: Secondary | ICD-10-CM | POA: Insufficient documentation

## 2016-12-23 DIAGNOSIS — M1990 Unspecified osteoarthritis, unspecified site: Secondary | ICD-10-CM | POA: Insufficient documentation

## 2016-12-23 DIAGNOSIS — Z87891 Personal history of nicotine dependence: Secondary | ICD-10-CM | POA: Insufficient documentation

## 2016-12-23 DIAGNOSIS — Z9049 Acquired absence of other specified parts of digestive tract: Secondary | ICD-10-CM | POA: Diagnosis not present

## 2016-12-23 DIAGNOSIS — J449 Chronic obstructive pulmonary disease, unspecified: Secondary | ICD-10-CM | POA: Insufficient documentation

## 2016-12-23 DIAGNOSIS — I482 Chronic atrial fibrillation: Secondary | ICD-10-CM | POA: Diagnosis not present

## 2016-12-23 DIAGNOSIS — I129 Hypertensive chronic kidney disease with stage 1 through stage 4 chronic kidney disease, or unspecified chronic kidney disease: Secondary | ICD-10-CM | POA: Diagnosis not present

## 2016-12-23 DIAGNOSIS — S71101A Unspecified open wound, right thigh, initial encounter: Secondary | ICD-10-CM | POA: Diagnosis present

## 2016-12-23 DIAGNOSIS — Z96651 Presence of right artificial knee joint: Secondary | ICD-10-CM | POA: Diagnosis not present

## 2016-12-23 DIAGNOSIS — N189 Chronic kidney disease, unspecified: Secondary | ICD-10-CM | POA: Diagnosis not present

## 2016-12-23 DIAGNOSIS — L02415 Cutaneous abscess of right lower limb: Secondary | ICD-10-CM | POA: Diagnosis not present

## 2016-12-24 NOTE — Progress Notes (Addendum)
JASMAIN, AHLBERG ([id number]) Visit Report for 12/23/2016 Arrival Information Details Patient Name: [name], [name]. Date of Service: 12/23/2016 1:30 PM Medical Record Number: [id number] Patient Account Number: [id number] Date of Birth/Sex: [birth year] (76 y.o. Female) Treating RN: Huel Coventry Primary Care Sayge Brienza: Bethann Punches Other Clinician: Referring Minor Iden: Bethann Punches Treating Javed Cotto/Extender: Linwood Dibbles, HOYT Weeks in Treatment: 4 Visit Information History Since Last Visit Added or deleted any medications: No Patient Arrived: Ambulatory Any new allergies or adverse reactions: No Arrival Time: 13:29 Had a fall or experienced change in No Accompanied By: self activities of daily living that may affect Transfer Assistance: None risk of falls: Patient Identification Verified: Yes Signs or symptoms of abuse/neglect since last No Secondary Verification Process Yes visito Completed: Hospitalized since last visit: No Patient Has Alerts: Yes Has Dressing in Place as Prescribed: Yes Patient Alerts: Patient on Blood Pain Present Now: No Thinner aspirin 81 mg Electronic Signature(s) Signed: 12/23/2016 4:43:28 PM By: Elliot Gurney, BSN, RN, CWS, Kim RN, BSN Entered By: Elliot Gurney, BSN, RN, CWS, Kim on 12/23/2016 13:29:31 [name], [name] ([id number]) -------------------------------------------------------------------------------- Clinic Level of Care Assessment Details Patient Name: [name], [name]. Date of Service: 12/23/2016 1:30 PM Medical Record Number: [id number] Patient Account Number: [id number] Date of Birth/Sex: [birth year] (76 y.o. Female) Treating RN: Huel Coventry Primary Care Vian Fluegel: Bethann Punches Other Clinician: Referring Jeanne Terrance: Bethann Punches Treating Ripley Lovecchio/Extender: Linwood Dibbles, HOYT Weeks in Treatment: 4 Clinic Level of Care Assessment Items TOOL 4 Quantity Score []  - Use when only an EandM is performed on FOLLOW-UP visit 0 ASSESSMENTS - Nursing Assessment /  Reassessment []  - Reassessment of Co-morbidities (includes updates in patient status) 0 X - Reassessment of Adherence to Treatment Plan 1 5 ASSESSMENTS - Wound and Skin Assessment / Reassessment X - Simple Wound Assessment / Reassessment - one wound 1 5 []  - Complex Wound Assessment / Reassessment - multiple wounds 0 []  - Dermatologic / Skin Assessment (not related to wound area) 0 ASSESSMENTS - Focused Assessment []  - Circumferential Edema Measurements - multi extremities 0 []  - Nutritional Assessment / Counseling / Intervention 0 []  - Lower Extremity Assessment (monofilament, tuning fork, pulses) 0 []  - Peripheral Arterial Disease Assessment (using hand held doppler) 0 ASSESSMENTS - Ostomy and/or Continence Assessment and Care []  - Incontinence Assessment and Management 0 []  - Ostomy Care Assessment and Management (repouching, etc.) 0 PROCESS - Coordination of Care X - Simple Patient / Family Education for ongoing care 1 15 []  - Complex (extensive) Patient / Family Education for ongoing care 0 []  - Staff obtains Chiropractor, Records, Test Results / Process Orders 0 []  - Staff telephones HHA, Nursing Homes / Clarify orders / etc 0 []  - Routine Transfer to another Facility (non-emergent condition) 0 [name], [name]. ([id number]) []  - Routine Hospital Admission (non-emergent condition) 0 []  - New Admissions / Manufacturing engineer / Ordering NPWT, Apligraf, etc. 0 []  - Emergency Hospital Admission (emergent condition) 0 X - Simple Discharge Coordination 1 10 []  - Complex (extensive) Discharge Coordination 0 PROCESS - Special Needs []  - Pediatric / Minor Patient Management 0 []  - Isolation Patient Management 0 []  - Hearing / Language / Visual special needs 0 []  - Assessment of Community assistance (transportation, D/C planning, etc.) 0 []  - Additional assistance / Altered mentation 0 []  - Support Surface(s) Assessment (bed, cushion, seat, etc.) 0 INTERVENTIONS - Wound Cleansing /  Measurement X - Simple Wound Cleansing - one wound 1 5 []  - Complex Wound Cleansing - multiple wounds 0 X -  Wound Imaging (photographs - any number of wounds) 1 5 []  - Wound Tracing (instead of photographs) 0 X - Simple Wound Measurement - one wound 1 5 []  - Complex Wound Measurement - multiple wounds 0 INTERVENTIONS - Wound Dressings []  - Small Wound Dressing one or multiple wounds 0 X - Medium Wound Dressing one or multiple wounds 1 15 []  - Large Wound Dressing one or multiple wounds 0 []  - Application of Medications - topical 0 []  - Application of Medications - injection 0 INTERVENTIONS - Miscellaneous []  - External ear exam 0 [name], [name]. ([id number]) []  - Specimen Collection (cultures, biopsies, blood, body fluids, etc.) 0 []  - Specimen(s) / Culture(s) sent or taken to Lab for analysis 0 []  - Patient Transfer (multiple staff / Michiel Sites Lift / Similar devices) 0 []  - Simple Staple / Suture removal (25 or less) 0 []  - Complex Staple / Suture removal (26 or more) 0 []  - Hypo / Hyperglycemic Management (close monitor of Blood Glucose) 0 []  - Ankle / Brachial Index (ABI) - do not check if billed separately 0 X - Vital Signs 1 5 Has the patient been seen at the hospital within the last three years: Yes Total Score: 70 Level Of Care: New/Established - Level 2 Electronic Signature(s) Signed: 12/23/2016 4:43:28 PM By: Elliot Gurney, BSN, RN, CWS, Kim RN, BSN Entered By: Elliot Gurney, BSN, RN, CWS, Kim on 12/23/2016 13:56:10 [name], [name] ([id number]) -------------------------------------------------------------------------------- Encounter Discharge Information Details Patient Name: [name]. Date of Service: 12/23/2016 1:30 PM Medical Record Number: [id number] Patient Account Number: [id number] Date of Birth/Sex: 05/22/1941 (76 y.o. Female) Treating RN: Huel Coventry Primary Care Taylon Louison: Bethann Punches Other Clinician: Referring Thomasene Dubow: Bethann Punches Treating Savahanna Almendariz/Extender: Linwood Dibbles, HOYT Weeks in Treatment: 4 Encounter Discharge Information Items Discharge Pain Level: 0 Discharge Condition: Stable Ambulatory Status: Ambulatory Discharge Destination: Home Transportation: Private Auto Accompanied By: self Schedule Follow-up Appointment: Yes Medication Reconciliation completed and provided to Patient/Care Yes Janyce Ellinger: Provided on Clinical Summary of Care: 12/23/2016 Form Type Recipient Paper Patient MS Electronic Signature(s) Signed: 12/23/2016 4:43:28 PM By: Elliot Gurney, BSN, RN, CWS, Kim RN, BSN Previous Signature: 12/23/2016 1:55:43 PM Version By: Gwenlyn Perking Entered By: Elliot Gurney BSN, RN, CWS, Kim on 12/23/2016 13:57:34 Hagstrom, [name] ([id number]) -------------------------------------------------------------------------------- Lower Extremity Assessment Details Patient Name: [name]. Date of Service: 12/23/2016 1:30 PM Medical Record Number: [id number] Patient Account Number: [id number] Date of Birth/Sex: [birth year] (76 y.o. Female) Treating RN: Huel Coventry Primary Care Iraida Cragin: Bethann Punches Other Clinician: Referring Kenzli Barritt: Bethann Punches Treating Dreden Rivere/Extender: Linwood Dibbles, HOYT Weeks in Treatment: 4 Electronic Signature(s) Signed: 12/23/2016 4:43:28 PM By: Elliot Gurney, BSN, RN, CWS, Kim RN, BSN Entered By: Elliot Gurney, BSN, RN, CWS, Kim on 12/23/2016 13:34:27 Singleterry, [name] ([id number]) -------------------------------------------------------------------------------- Multi Wound Chart Details Patient Name: [name]. Date of Service: 12/23/2016 1:30 PM Medical Record Number: [id number] Patient Account Number: [id number] Date of Birth/Sex: [birth year] (76 y.o. Female) Treating RN: Huel Coventry Primary Care Elycia Woodside: Bethann Punches Other Clinician: Referring Kemiya Batdorf: Bethann Punches Treating Jishnu Jenniges/Extender: Linwood Dibbles, HOYT Weeks in Treatment: 4 Vital Signs Height(in): 64 Pulse(bpm): 63 Weight(lbs): Blood Pressure 151/65 (mmHg): Body Mass  Index(BMI): Temperature(F): 98.1 Respiratory Rate 16 (breaths/min): Photos: [N/A:N/A] Wound Location: Right Upper Leg N/A N/A Wounding Event: Bump N/A N/A Primary Etiology: Abscess N/A N/A Comorbid History: Cataracts, Hypertension, N/A N/A Osteoarthritis Date Acquired: 11/20/2016 N/A N/A Weeks of Treatment: 4 N/A N/A Wound Status: Open N/A N/A Measurements L x W x D 0.4x0.8x0.1 N/A N/A (cm) Area (cm) :  0.251 N/A N/A Volume (cm) : 0.025 N/A N/A % Reduction in Area: 95.40% N/A N/A % Reduction in Volume: 99.50% N/A N/A Classification: Full Thickness Without N/A N/A Exposed Support Structures Exudate Amount: Large N/A N/A Exudate Type: Serosanguineous N/A N/A Exudate Color: red, brown N/A N/A Wound Margin: Indistinct, nonvisible N/A N/A Granulation Amount: Large (67-100%) N/A N/A Granulation Quality: Red N/A N/A Necrotic Amount: None Present (0%) N/A N/A Elizarraraz, Nura Garcia. ([id number]) Exposed Structures: Fat Layer (Subcutaneous N/A N/A Tissue) Exposed: Yes Fascia: No Tendon: No Muscle: No Joint: No Bone: No Epithelialization: None N/A N/A Periwound Skin Texture: Excoriation: No N/A N/A Induration: No Callus: No Crepitus: No Rash: No Scarring: No Periwound Skin Maceration: No N/A N/A Moisture: Dry/Scaly: No Periwound Skin Color: Ecchymosis: Yes N/A N/A Atrophie Blanche: No Cyanosis: No Erythema: No Hemosiderin Staining: No Mottled: No Pallor: No Rubor: No Tenderness on No N/A N/A Palpation: Wound Preparation: Ulcer Cleansing: N/A N/A Rinsed/Irrigated with Saline Topical Anesthetic Applied: None Treatment Notes Electronic Signature(s) Signed: 12/23/2016 4:43:28 PM By: Elliot Gurney, BSN, RN, CWS, Kim RN, BSN Entered By: Elliot Gurney, BSN, RN, CWS, Kim on 12/23/2016 13:55:40 Fiorenza, [name] ([id number]) -------------------------------------------------------------------------------- Multi-Disciplinary Care Plan Details Patient Name: [name], [name]. Date of  Service: 12/23/2016 1:30 PM Medical Record Number: [id number] Patient Account Number: [id number] Date of Birth/Sex: 1941-03-07 (76 y.o. Female) Treating RN: Huel Coventry Primary Care Danelle Curiale: Bethann Punches Other Clinician: Referring Yovany Clock: Bethann Punches Treating Lizzie Cokley/Extender: Linwood Dibbles, HOYT Weeks in Treatment: 4 Active Inactive Electronic Signature(s) Signed: 01/18/2017 3:11:55 PM By: Elliot Gurney, BSN, RN, CWS, Kim RN, BSN Previous Signature: 12/23/2016 4:43:28 PM Version By: Elliot Gurney, BSN, RN, CWS, Kim RN, BSN Entered By: Elliot Gurney, BSN, RN, CWS, Kim on 01/18/2017 15:11:55 Prue, [name] ([id number]) -------------------------------------------------------------------------------- Pain Assessment Details Patient Name: [name], [name]. Date of Service: 12/23/2016 1:30 PM Medical Record Number: [id number] Patient Account Number: [id number] Date of Birth/Sex: 08/31/1940 (76 y.o. Female) Treating RN: Huel Coventry Primary Care Donnamae Muilenburg: Bethann Punches Other Clinician: Referring Kashmere Staffa: Bethann Punches Treating Ikechukwu Cerny/Extender: Linwood Dibbles, HOYT Weeks in Treatment: 4 Active Problems Location of Pain Severity and Description of Pain Patient Has Paino No Site Locations With Dressing Change: No Pain Management and Medication Current Pain Management: Goals for Pain Management Topical or injectable lidocaine is offered to patient for acute pain when surgical debridement is performed. If needed, Patient is instructed to use over the counter pain medication for the following 24-48 hours after debridement. Wound care MDs do not prescribed pain medications. Patient has chronic pain or uncontrolled pain. Patient has been instructed to make an appointment with their Primary Care Physician for pain management. Electronic Signature(s) Signed: 12/23/2016 4:43:28 PM By: Elliot Gurney, BSN, RN, CWS, Kim RN, BSN Entered By: Elliot Gurney, BSN, RN, CWS, Kim on 12/23/2016 13:29:44 Kallen, [name]  ([id number]) -------------------------------------------------------------------------------- Patient/Caregiver Education Details Patient Name: [name], [name]. Date of Service: 12/23/2016 1:30 PM Medical Record Number: [id number] Patient Account Number: [id number] Date of Birth/Gender: 13-Dec-1940 (76 y.o. Female) Treating RN: Huel Coventry Primary Care Physician: Bethann Punches Other Clinician: Referring Physician: Bethann Punches Treating Physician/Extender: Linwood Dibbles, HOYT Weeks in Treatment: 4 Education Assessment Education Provided To: Patient Education Topics Provided Wound/Skin Impairment: Handouts: Caring for Your Ulcer Methods: Demonstration Responses: State content correctly Electronic Signature(s) Signed: 12/23/2016 4:43:28 PM By: Elliot Gurney, BSN, RN, CWS, Kim RN, BSN Entered By: Elliot Gurney, BSN, RN, CWS, Kim on 12/23/2016 13:57:46 Sevigny, [name] ([id number]) -------------------------------------------------------------------------------- Wound Assessment Details Patient Name: [name], [name]. Date of Service: 12/23/2016 1:30 PM Medical Record Number:  [id number] Patient Account Number: [id number] Date of Birth/Sex: 06/29/1940 (76 y.o. Female) Treating RN: Huel CoventryWoody, Kim Primary Care Raelee Rossmann: Bethann PunchesMiller, Mark Other Clinician: Referring Tomie Spizzirri: Bethann PunchesMiller, Mark Treating Cordai Rodrigue/Extender: Linwood DibblesSTONE III, HOYT Weeks in Treatment: 4 Wound Status Wound Number: 1 Primary Abscess Etiology: Wound Location: Right Upper Leg Wound Status: Open Wounding Event: Bump Comorbid Cataracts, Hypertension, Date Acquired: 11/20/2016 History: Osteoarthritis Weeks Of Treatment: 4 Clustered Wound: No Photos Wound Measurements Length: (cm) 0.4 Width: (cm) 0.8 Depth: (cm) 0.1 Area: (cm) 0.251 Volume: (cm) 0.025 % Reduction in Area: 95.4% % Reduction in Volume: 99.5% Epithelialization: None Tunneling: No Undermining: No Wound Description Full Thickness Without Exposed Classification: Support  Structures Wound Margin: Indistinct, nonvisible Exudate Small Amount: Exudate Type: Serosanguineous Exudate Color: red, brown Foul Odor After Cleansing: No Slough/Fibrino No Wound Bed Granulation Amount: Large (67-100%) Exposed Structure Granulation Quality: Red Fascia Exposed: No Necrotic Amount: None Present (0%) Fat Layer (Subcutaneous Tissue) Exposed: Yes Tendon Exposed: No Muscle Exposed: No Joint Exposed: No Heiman, Aylinn Garcia. ([id number]) Bone Exposed: No Periwound Skin Texture Texture Color No Abnormalities Noted: No No Abnormalities Noted: No Callus: No Atrophie Blanche: No Crepitus: No Cyanosis: No Excoriation: No Ecchymosis: Yes Induration: No Erythema: No Rash: No Hemosiderin Staining: No Scarring: No Mottled: No Pallor: No Moisture Rubor: No No Abnormalities Noted: No Dry / Scaly: No Maceration: No Wound Preparation Ulcer Cleansing: Rinsed/Irrigated with Saline Topical Anesthetic Applied: None Electronic Signature(s) Signed: 12/23/2016 4:43:28 PM By: Elliot GurneyWoody, BSN, RN, CWS, Kim RN, BSN Entered By: Elliot GurneyWoody, BSN, RN, CWS, Kim on 12/23/2016 13:56:43 [name], Maureen AlineMARTHA Garcia. ([id number]) -------------------------------------------------------------------------------- Vitals Details Patient Name: Maureen SierrasSHOFFNER, [name]. Date of Service: 12/23/2016 1:30 PM Medical Record Number: [id number] Patient Account Number: [id number] Date of Birth/Sex: 08/19/1940 (76 y.o. Female) Treating RN: Huel CoventryWoody, Kim Primary Care Treniece Holsclaw: Bethann PunchesMiller, Mark Other Clinician: Referring Treyven Lafauci: Bethann PunchesMiller, Mark Treating Ellenora Talton/Extender: Linwood DibblesSTONE III, HOYT Weeks in Treatment: 4 Vital Signs Time Taken: 13:29 Temperature (F): 98.1 Height (in): 64 Pulse (bpm): 63 Respiratory Rate (breaths/min): 16 Blood Pressure (mmHg): 151/65 Reference Range: 80 - 120 mg / dl Electronic Signature(s) Signed: 12/23/2016 4:43:28 PM By: Elliot GurneyWoody, BSN, RN, CWS, Kim RN, BSN Entered By: Elliot GurneyWoody, BSN, RN, CWS, Kim on  12/23/2016 13:30:21

## 2016-12-24 NOTE — Progress Notes (Signed)
TARESA, MONTVILLE ([id number]) Visit Report for 12/23/2016 Chief Complaint Document Details Patient Name: Maureen, Garcia. Date of Service: 12/23/2016 1:30 PM Medical Record Number: [id number] Patient Account Number: [id number] Date of Birth/Sex: 05-Jul-1940 (76 y.o. Female) Treating RN: Huel Coventry Primary Care Provider: Bethann Punches Other Clinician: Referring Provider: Bethann Punches Treating Provider/Extender: Linwood Dibbles, HOYT Weeks in Treatment: 4 Information Obtained from: Patient Chief Complaint Patient presents to the wound care center for a consult due non healing wound to the right upper thigh posteriorly just below her gluteal fold Electronic Signature(s) Signed: 12/23/2016 4:47:40 PM By: Lenda Kelp PA-C Entered By: Lenda Kelp on 12/23/2016 14:25:41 [name], [name] ([id number]) -------------------------------------------------------------------------------- HPI Details Patient Name: [name]. Date of Service: 12/23/2016 1:30 PM Medical Record Number: [id number] Patient Account Number: [id number] Date of Birth/Sex: July 19, 1940 (76 y.o. Female) Treating RN: Huel Coventry Primary Care Provider: Bethann Punches Other Clinician: Referring Provider: Bethann Punches Treating Provider/Extender: Linwood Dibbles, HOYT Weeks in Treatment: 4 History of Present Illness Location: recent IandD for a indurated abscess on the right gluteal fold and upper thigh Quality: Patient reports experiencing a sharp pain to affected area(s). Severity: Patient states wound are getting better Duration: Patient has had the wound for < 1 week prior to presenting for treatment Timing: Pain in wound is constant (hurts all the time) Context: The wound would happen gradually Modifying Factors: Other treatment(s) tried include:antibiotics given to her by her PCP and IandD done in the ER Associated Signs and Symptoms: Patient reports having increase discharge. HPI Description: 76 year old patient who was  seen in the emergency department for abscess on the back of her right thigh. She had an abscess with erythema and purulent drainage. This was drained under local anesthesia and packed with 1/4 inch gauze. she received IV antibiotics -- vancomycin, while she was there Past medical history of arthritis, atrial fibrillation chronic kidney disease and status post right partial knee replacement. He has also had a cholecystectomy, eye surgery and tonsillectomy. She is a former smoker quit in 2000. 12/02/2016 -- the patient is in a much better frame of mind today and is accompanied by her daughter who has been helping her with her care 12/09/16 patient right gluteal wound in the upper thigh region appears to be doing very well on evaluation today. She has excellent granulation tissue and I'm pleased with how this seems to be progressing. There is no evidence of infection. 12/23/16 on evaluation today patient's wound appears to be doing excellent in regard to the right gluteal wound. Unfortunately the wound is smaller and though she is somewhat nervous about it this appears to be healing very well. There is no infection and she has only minimal discomfort now. Electronic Signature(s) Signed: 12/23/2016 4:47:40 PM By: Lenda Kelp PA-C Entered By: Lenda Kelp on 12/23/2016 14:27:56 [name] ([id number]) -------------------------------------------------------------------------------- Physical Exam Details Patient Name: [name]. Date of Service: 12/23/2016 1:30 PM Medical Record Number: [id number] Patient Account Number: [id number] Date of Birth/Sex: [birth year] (76 y.o. Female) Treating RN: Huel Coventry Primary Care Provider: Bethann Punches Other Clinician: Referring Provider: Bethann Punches Treating Provider/Extender: STONE III, HOYT Weeks in Treatment: 4 Constitutional Well-nourished and well-hydrated in no acute distress. Respiratory normal breathing without difficulty. clear to  auscultation bilaterally. Cardiovascular regular rate and rhythm with normal S1, S2. Psychiatric this patient is able to make decisions and demonstrates good insight into disease process. Alert and Oriented x 3. pleasant and cooperative. Notes Patient has a good granulation bed  with no evidence of infection and no necrotic tissue. No debridement was necessary today. Electronic Signature(s) Signed: 12/23/2016 4:47:40 PM By: Lenda KelpStone III, Hoyt PA-C Entered By: Lenda KelpStone III, Hoyt on 12/23/2016 14:28:34 [name], Maureen AlineMARTHA M. ([id number]) -------------------------------------------------------------------------------- Physician Orders Details Patient Name: [name], [name]. Date of Service: 12/23/2016 1:30 PM Medical Record Number: [id number] Patient Account Number: [id number] Date of Birth/Sex: 10/23/1940 (76 y.o. Female) Treating RN: Huel CoventryWoody, Kim Primary Care Provider: Bethann PunchesMiller, Mark Other Clinician: Referring Provider: Bethann PunchesMiller, Mark Treating Provider/Extender: Linwood DibblesSTONE III, HOYT Weeks in Treatment: 4 Verbal / Phone Orders: No Diagnosis Coding ICD-10 Coding Code Description S71.101A Unspecified open wound, right thigh, initial encounter L02.415 Cutaneous abscess of right lower limb Wound Cleansing Wound #1 Right Upper Leg o Clean wound with Normal Saline. Anesthetic Wound #1 Right Upper Leg o Topical Lidocaine 4% cream applied to wound bed prior to debridement Primary Wound Dressing Wound #1 Right Upper Leg o Hydrafera Blue Secondary Dressing Wound #1 Right Upper Leg o Dry Gauze o Boardered Foam Dressing Dressing Change Frequency Wound #1 Right Upper Leg o Change Dressing Monday, Wednesday, Friday Follow-up Appointments Wound #1 Right Upper Leg o Return Appointment in 1 week. Home Health Wound #1 Right Upper Leg o Continue Home Health Visits o Home Health Nurse may visit PRN to address patientos wound care needs. [name], [name]. ([id number]) o FACE TO FACE  ENCOUNTER: MEDICARE and MEDICAID PATIENTS: I certify that this patient is under my care and that I had a face-to-face encounter that meets the physician face-to-face encounter requirements with this patient on this date. The encounter with the patient was in whole or in part for the following MEDICAL CONDITION: (primary reason for Home Healthcare) MEDICAL NECESSITY: I certify, that based on my findings, NURSING services are a medically necessary home health service. HOME BOUND STATUS: I certify that my clinical findings support that this patient is homebound (i.e., Due to illness or injury, pt requires aid of supportive devices such as crutches, cane, wheelchairs, walkers, the use of special transportation or the assistance of another person to leave their place of residence. There is a normal inability to leave the home and doing so requires considerable and taxing effort. Other absences are for medical reasons / religious services and are infrequent or of short duration when for other reasons). o If current dressing causes regression in wound condition, may D/C ordered dressing product/s and apply Normal Saline Moist Dressing daily until next Wound Healing Center / Other MD appointment. Notify Wound Healing Center of regression in wound condition at [phone]. o Please direct any NON-WOUND related issues/requests for orders to patient's Primary Care Physician Notes I'm going to recommend that we continue with the Current wound care measures for the next week. We will see her for reevaluation at that time. If anything worsens in the interim she will contact our office for additional recommendations. Electronic Signature(s) Signed: 12/23/2016 4:47:40 PM By: Lenda KelpStone III, Hoyt PA-C Entered By: Lenda KelpStone III, Hoyt on 12/23/2016 14:29:17 [name], Maureen AlineMARTHA M. ([id number]) -------------------------------------------------------------------------------- Problem List Details Patient Name: [name],  Krizia M. Date of Service: 12/23/2016 1:30 PM Medical Record Number: [id number] Patient Account Number: [id number] Date of Birth/Sex: 04/24/1941 (76 y.o. Female) Treating RN: Huel CoventryWoody, Kim Primary Care Provider: Bethann PunchesMiller, Mark Other Clinician: Referring Provider: Bethann PunchesMiller, Mark Treating Provider/Extender: Linwood DibblesSTONE III, HOYT Weeks in Treatment: 4 Active Problems ICD-10 Encounter Code Description Active Date Diagnosis S71.101A Unspecified open wound, right thigh, initial encounter 11/25/2016 Yes L02.415 Cutaneous abscess of right lower limb 11/25/2016 Yes Inactive Problems Resolved Problems  Electronic Signature(s) Signed: 12/23/2016 4:47:40 PM By: Lenda Kelp PA-C Entered By: Lenda Kelp on 12/23/2016 13:48:46 [name], [name] ([id number]) -------------------------------------------------------------------------------- Progress Note Details Patient Name: [name]. Date of Service: 12/23/2016 1:30 PM Medical Record Number: [id number] Patient Account Number: [id number] Date of Birth/Sex: 06-11-1941 (76 y.o. Female) Treating RN: Huel Coventry Primary Care Provider: Bethann Punches Other Clinician: Referring Provider: Bethann Punches Treating Provider/Extender: Linwood Dibbles, HOYT Weeks in Treatment: 4 Subjective Chief Complaint Information obtained from Patient Patient presents to the wound care center for a consult due non healing wound to the right upper thigh posteriorly just below her gluteal fold History of Present Illness (HPI) The following HPI elements were documented for the patient's wound: Location: recent IandD for a indurated abscess on the right gluteal fold and upper thigh Quality: Patient reports experiencing a sharp pain to affected area(s). Severity: Patient states wound are getting better Duration: Patient has had the wound for < 1 week prior to presenting for treatment Timing: Pain in wound is constant (hurts all the time) Context: The wound would happen  gradually Modifying Factors: Other treatment(s) tried include:antibiotics given to her by her PCP and IandD done in the ER Associated Signs and Symptoms: Patient reports having increase discharge. 76 year old patient who was seen in the emergency department for abscess on the back of her right thigh. She had an abscess with erythema and purulent drainage. This was drained under local anesthesia and packed with 1/4 inch gauze. she received IV antibiotics -- vancomycin, while she was there Past medical history of arthritis, atrial fibrillation chronic kidney disease and status post right partial knee replacement. He has also had a cholecystectomy, eye surgery and tonsillectomy. She is a former smoker quit in 2000. 12/02/2016 -- the patient is in a much better frame of mind today and is accompanied by her daughter who has been helping her with her care 12/09/16 patient right gluteal wound in the upper thigh region appears to be doing very well on evaluation today. She has excellent granulation tissue and I'm pleased with how this seems to be progressing. There is no evidence of infection. 12/23/16 on evaluation today patient's wound appears to be doing excellent in regard to the right gluteal wound. Unfortunately the wound is smaller and though she is somewhat nervous about it this appears to be healing very well. There is no infection and she has only minimal discomfort now. [name], [name]. ([id number]) Objective Constitutional Well-nourished and well-hydrated in no acute distress. Vitals Time Taken: 1:29 PM, Height: 64 in, Temperature: 98.1 F, Pulse: 63 bpm, Respiratory Rate: 16 breaths/min, Blood Pressure: 151/65 mmHg. Respiratory normal breathing without difficulty. clear to auscultation bilaterally. Cardiovascular regular rate and rhythm with normal S1, S2. Psychiatric this patient is able to make decisions and demonstrates good insight into disease process. Alert and Oriented x 3.  pleasant and cooperative. General Notes: Patient has a good granulation bed with no evidence of infection and no necrotic tissue. No debridement was necessary today. Integumentary (Hair, Skin) Wound #1 status is Open. Original cause of wound was Bump. The wound is located on the Right Upper Leg. The wound measures 0.4cm length x 0.8cm width x 0.1cm depth; 0.251cm^2 area and 0.025cm^3 volume. There is Fat Layer (Subcutaneous Tissue) Exposed exposed. There is no tunneling or undermining noted. There is a small amount of serosanguineous drainage noted. The wound margin is indistinct and nonvisible. There is large (67-100%) red granulation within the wound bed. There is no necrotic tissue within  the wound bed. The periwound skin appearance exhibited: Ecchymosis. The periwound skin appearance did not exhibit: Callus, Crepitus, Excoriation, Induration, Rash, Scarring, Dry/Scaly, Maceration, Atrophie Blanche, Cyanosis, Hemosiderin Staining, Mottled, Pallor, Rubor, Erythema. Assessment Active Problems ICD-10 S71.101A - Unspecified open wound, right thigh, initial encounter L02.415 - Cutaneous abscess of right lower limb [name], [name]. ([id number]) Plan Wound Cleansing: Wound #1 Right Upper Leg: Clean wound with Normal Saline. Anesthetic: Wound #1 Right Upper Leg: Topical Lidocaine 4% cream applied to wound bed prior to debridement Primary Wound Dressing: Wound #1 Right Upper Leg: Hydrafera Blue Secondary Dressing: Wound #1 Right Upper Leg: Dry Gauze Boardered Foam Dressing Dressing Change Frequency: Wound #1 Right Upper Leg: Change Dressing Monday, Wednesday, Friday Follow-up Appointments: Wound #1 Right Upper Leg: Return Appointment in 1 week. Home Health: Wound #1 Right Upper Leg: Continue Home Health Visits Home Health Nurse may visit PRN to address patient s wound care needs. FACE TO FACE ENCOUNTER: MEDICARE and MEDICAID PATIENTS: I certify that this patient is under my  care and that I had a face-to-face encounter that meets the physician face-to-face encounter requirements with this patient on this date. The encounter with the patient was in whole or in part for the following MEDICAL CONDITION: (primary reason for Home Healthcare) MEDICAL NECESSITY: I certify, that based on my findings, NURSING services are a medically necessary home health service. HOME BOUND STATUS: I certify that my clinical findings support that this patient is homebound (i.e., Due to illness or injury, pt requires aid of supportive devices such as crutches, cane, wheelchairs, walkers, the use of special transportation or the assistance of another person to leave their place of residence. There is a normal inability to leave the home and doing so requires considerable and taxing effort. Other absences are for medical reasons / religious services and are infrequent or of short duration when for other reasons). If current dressing causes regression in wound condition, may D/C ordered dressing product/s and apply Normal Saline Moist Dressing daily until next Wound Healing Center / Other MD appointment. Notify Wound Healing Center of regression in wound condition at [phone]. Please direct any NON-WOUND related issues/requests for orders to patient's Primary Care Physician General Notes: I'm going to recommend that we continue with the Current wound care measures for the next week. We will see her for reevaluation at that time. If anything worsens in the interim she will contact our office for additional recommendations. [name], [name] ([id number]) Electronic Signature(s) Signed: 12/23/2016 4:47:40 PM By: Lenda Kelp PA-C Entered By: Lenda Kelp on 12/23/2016 14:29:30 [name], [name] ([id number]) -------------------------------------------------------------------------------- SuperBill Details Patient Name: [name]. Date of Service: 12/23/2016 Medical Record  Number: [id number] Patient Account Number: [id number] Date of Birth/Sex: January 02, 1941 (76 y.o. Female) Treating RN: Huel Coventry Primary Care Provider: Bethann Punches Other Clinician: Referring Provider: Bethann Punches Treating Provider/Extender: Linwood Dibbles, HOYT Weeks in Treatment: 4 Diagnosis Coding ICD-10 Codes Code Description S71.101A Unspecified open wound, right thigh, initial encounter L02.415 Cutaneous abscess of right lower limb Facility Procedures CPT4 Code: [id number] Description: [phone] - WOUND CARE VISIT-LEV 2 EST PT Modifier: Quantity: 1 Physician Procedures CPT4 Code: [id number] Description: 99213 - WC PHYS LEVEL 3 - EST PT ICD-10 Description Diagnosis S71.101A Unspecified open wound, right thigh, initial en L02.415 Cutaneous abscess of right lower limb Modifier: counter Quantity: 1 Electronic Signature(s) Signed: 12/23/2016 4:47:40 PM By: Lenda Kelp PA-C Entered By: Lenda Kelp on 12/23/2016 14:29:47

## 2016-12-30 ENCOUNTER — Ambulatory Visit: Payer: Medicare Other | Admitting: Physician Assistant

## 2017-04-19 ENCOUNTER — Other Ambulatory Visit: Payer: Self-pay | Admitting: Internal Medicine

## 2017-04-19 ENCOUNTER — Ambulatory Visit
Admission: RE | Admit: 2017-04-19 | Discharge: 2017-04-19 | Disposition: A | Discharge status: Home / Self Care | Payer: Medicare Other | Source: Ambulatory Visit | Attending: Internal Medicine | Admitting: Internal Medicine

## 2017-04-19 DIAGNOSIS — G9349 Other encephalopathy: Secondary | ICD-10-CM

## 2017-04-19 DIAGNOSIS — L03311 Cellulitis of abdominal wall: Secondary | ICD-10-CM | POA: Diagnosis present

## 2018-12-15 ENCOUNTER — Emergency Department
Admission: EM | Admit: 2018-12-15 | Discharge: 2018-12-15 | Disposition: A | Discharge status: Home / Self Care | Payer: Medicare Other | Attending: Emergency Medicine | Admitting: Emergency Medicine

## 2018-12-15 ENCOUNTER — Encounter: Payer: Self-pay | Admitting: Emergency Medicine

## 2018-12-15 ENCOUNTER — Other Ambulatory Visit: Payer: Self-pay

## 2018-12-15 DIAGNOSIS — Z7982 Long term (current) use of aspirin: Secondary | ICD-10-CM | POA: Diagnosis not present

## 2018-12-15 DIAGNOSIS — I4891 Unspecified atrial fibrillation: Secondary | ICD-10-CM | POA: Insufficient documentation

## 2018-12-15 DIAGNOSIS — Z87891 Personal history of nicotine dependence: Secondary | ICD-10-CM | POA: Diagnosis not present

## 2018-12-15 DIAGNOSIS — N390 Urinary tract infection, site not specified: Secondary | ICD-10-CM | POA: Diagnosis not present

## 2018-12-15 DIAGNOSIS — I10 Essential (primary) hypertension: Secondary | ICD-10-CM | POA: Diagnosis not present

## 2018-12-15 DIAGNOSIS — R300 Dysuria: Secondary | ICD-10-CM | POA: Diagnosis present

## 2018-12-15 DIAGNOSIS — Z79899 Other long term (current) drug therapy: Secondary | ICD-10-CM | POA: Diagnosis not present

## 2018-12-15 LAB — URINALYSIS, COMPLETE (UACMP) WITH MICROSCOPIC
Bacteria, UA: NONE SEEN
Bilirubin Urine: NEGATIVE
Glucose, UA: NEGATIVE mg/dL
Ketones, ur: 5 mg/dL — AB
Nitrite: NEGATIVE
Protein, ur: 30 mg/dL — AB
Specific Gravity, Urine: 1.024 (ref 1.005–1.030)
pH: 6 (ref 5.0–8.0)

## 2018-12-15 MED ORDER — SULFAMETHOXAZOLE-TRIMETHOPRIM 800-160 MG PO TABS
1.0000 | ORAL_TABLET | Freq: Once | ORAL | Status: AC
  Administered 2018-12-15: 1 via ORAL
  Filled 2018-12-15: qty 1

## 2018-12-15 MED ORDER — SULFAMETHOXAZOLE-TRIMETHOPRIM 800-160 MG PO TABS: 1.0000 | ORAL_TABLET | Freq: Two times a day (BID) | ORAL | 0 refills | Status: DC

## 2018-12-15 MED ORDER — PHENAZOPYRIDINE HCL 200 MG PO TABS
200.0000 mg | ORAL_TABLET | Freq: Once | ORAL | Status: AC
  Administered 2018-12-15: 200 mg via ORAL
  Filled 2018-12-15: qty 1

## 2018-12-15 MED ORDER — PHENAZOPYRIDINE HCL 200 MG PO TABS: 200.0000 mg | ORAL_TABLET | Freq: Three times a day (TID) | ORAL | 0 refills | Status: AC | PRN

## 2018-12-15 NOTE — Discharge Instructions (Addendum)
Take medication as directed follow discharge care instruction.  Urine culture is pending and you will be notified telephonically of the results.

## 2018-12-15 NOTE — ED Notes (Signed)
D/c paperwork printed and signed by pt and this RN as computer at bedside down.

## 2018-12-15 NOTE — ED Triage Notes (Signed)
Pt to ED via POV stating that she has burning with urination and pressure. Pt is in NAD.

## 2018-12-15 NOTE — ED Provider Notes (Signed)
[facility] Emergency Department Provider Note   ____________________________________________   First MD Initiated Contact with Patient 12/15/18 1838     (approximate)  I have reviewed the triage vital signs and the nursing notes.   HISTORY  Chief Complaint Dysuria    HPI [name] Scally is a 78 y.o. female patient complain onset of burning with urination onset today.  Patient also complain of bladder pressure.  Patient denies vaginal discharge.  Patient denies fever/chill or flank pain.  Patient states this is her third urinary tract infection and her test is positive today.  Patient rates the pain as a 3/10.  No palliative measure for complaint.         Past Medical History:  Diagnosis Date  . Arthritis   . Atrial fibrillation (HCC)   . Chronic kidney disease    UTI  . GERD (gastroesophageal reflux disease)   . Hypertension     Patient Active Problem List   Diagnosis Date Noted  . 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 BREAST ENHANCEMENT SURGERY    . CHOLECYSTECTOMY    . EYE SURGERY Bilateral    Cataract Extraction  . PARTIAL KNEE ARTHROPLASTY Right 02/24/2015   Procedure: UNICOMPARTMENTAL KNEE;  Surgeon: Christena FlakeJohn J Poggi, MD;  Location: [facility] ORS;  Service: Orthopedics;  Laterality: Right;  . TONSILLECTOMY      Prior to Admission medications   Medication Sig Start Date End Date Taking? Authorizing Provider  aspirin (BAYER ASPIRIN EC LOW DOSE) 81 MG EC tablet Take 81 mg by mouth daily. Swallow whole.    [provider]  Biotin 5000 MCG CAPS Take 1 capsule by mouth daily.    [provider]  bisacodyl (DULCOLAX) 5 MG EC tablet Take 5 mg by mouth daily.    [provider]  Calcium Carb-Cholecalciferol (CALTRATE 600+D) 600-800 MG-UNIT TABS Take 1 tablet by mouth daily.    [provider]  Cranberry 400 MG CAPS Take 1 capsule by mouth  daily.    [provider]  estradiol (ESTRACE) 0.1 MG/GM vaginal cream Place 1 Applicatorful vaginally daily.    [provider]  fluticasone (FLONASE) 50 MCG/ACT nasal spray Place 1 spray into both nostrils daily.    [provider]  lisinopril-hydrochlorothiazide (PRINZIDE,ZESTORETIC) 20-12.5 MG per tablet Take 1 tablet by mouth daily.    [provider]  Misc Natural Products (OSTEO BI-FLEX TRIPLE STRENGTH) TABS Take 1 tablet by mouth 2 (two) times daily.    [provider]  Multiple Vitamins-Minerals (CENTRUM PO) Take 1 tablet by mouth daily.    [provider]  omeprazole (PRILOSEC) 20 MG capsule Take 20 mg by mouth at bedtime.    [provider]  phenazopyridine (PYRIDIUM) 200 MG tablet Take 1 tablet (200 mg total) by mouth 3 (three) times daily as needed for pain. 12/15/18   Joni ReiningSmith, Izaias Krupka K, PA-C  potassium chloride (MICRO-K) 10 MEQ CR capsule Take 10 mEq by mouth daily.    [provider]  Probiotic Product (ALIGN) 4 MG CAPS Take 1 capsule by mouth daily.    [provider]  Propylene Glycol (SYSTANE BALANCE) 0.6 % SOLN Apply 1 drop to eye 2 (two) times daily.    [provider]  ranitidine (ZANTAC) 150 MG tablet Take 150 mg by mouth daily.    [provider]  sulfamethoxazole-trimethoprim (BACTRIM DS) 800-160 MG tablet Take 1 tablet by  mouth 2 (two) times daily. 12/15/18   Joni ReiningSmith, Kylina Vultaggio K, PA-C  zolpidem (AMBIEN) 10 MG tablet Take 10 mg by mouth at bedtime as needed for sleep.    [provider]    Allergies Ceclor [cefaclor], Clarithromycin, Doxycycline, Macrobid [nitrofurantoin], Meloxicam, and Prednisone  No family history on file.  Social History Social History   Tobacco Use  . Smoking status: Former Smoker    Packs/day: 1.00    Types: Cigarettes    Quit date: 01/19/1999    Years since quitting: 19.9  . Smokeless tobacco: Never Used  Substance Use Topics  . Alcohol use:  Yes    Comment: social  . Drug use: No    Review of Systems Constitutional: No fever/chills Eyes: No visual changes. ENT: No sore throat. Cardiovascular: Denies chest pain. Respiratory: Denies shortness of breath. Gastrointestinal: No abdominal pain.  No nausea, no vomiting.  No diarrhea.  No constipation. Genitourinary: Negative for dysuria. Musculoskeletal: Negative for back pain. Skin: Negative for rash. Neurological: Negative for headaches, focal weakness or numbness. Endocrine:  Hypertension. Allergic/Immunilogical: Medication list. ____________________________________________   PHYSICAL EXAM:  VITAL SIGNS: ED Triage Vitals  Enc Vitals Group     BP 12/15/18 1622 (!) 148/70     Pulse Rate 12/15/18 1622 62     Resp 12/15/18 1622 16     Temp 12/15/18 1622 98.3 F (36.8 C)     Temp Source 12/15/18 1622 Oral     SpO2 12/15/18 1622 96 %     Weight --      Height --      Head Circumference --      Peak Flow --      Pain Score 12/15/18 1626 3     Pain Loc --      Pain Edu? --      Excl. in GC? --     Constitutional: Alert and oriented. Well appearing and in no acute distress. Cardiovascular: Normal rate, regular rhythm. Grossly normal heart sounds.  Good peripheral circulation. Respiratory: Normal respiratory effort.  No retractions. Lungs CTAB. Gastrointestinal: Soft and nontender. No distention. No abdominal bruits. No CVA tenderness. Genitourinary: Deferred Musculoskeletal: No lower extremity tenderness nor edema.  No joint effusions. Neurologic:  Normal speech and language. No gross focal neurologic deficits are appreciated. No gait instability. Skin:  Skin is warm, dry and intact. No rash noted. Psychiatric: Mood and affect are normal. Speech and behavior are normal.  ____________________________________________   LABS (all labs ordered are listed, but only abnormal results are displayed)  Labs Reviewed  URINALYSIS, COMPLETE (UACMP) WITH MICROSCOPIC -  Abnormal; Notable for the following components:      Result Value   Color, Urine AMBER (*)    APPearance TURBID (*)    Hgb urine dipstick SMALL (*)    Ketones, ur 5 (*)    Protein, ur 30 (*)    Leukocytes,Ua MODERATE (*)    All other components within normal limits  URINE CULTURE   ____________________________________________  EKG   ____________________________________________  RADIOLOGY  ED MD interpretation:    Official radiology report(s): No results found.  ____________________________________________   PROCEDURES  Procedure(s) performed (including Critical Care):  Procedures   ____________________________________________   INITIAL IMPRESSION / ASSESSMENT AND PLAN / ED COURSE  As part of my medical decision making, I reviewed the following data within the electronic MEDICAL RECORD NUMBER         [name] Kronk was evaluated in Emergency Department on 12/15/2018 for the  symptoms described in the history of present illness. She was evaluated in the context of the global COVID-19 pandemic, which necessitated consideration that the patient might be at risk for infection with the SARS-CoV-2 virus that causes COVID-19. Institutional protocols and algorithms that pertain to the evaluation of patients at risk for COVID-19 are in a state of rapid change based on information released by regulatory bodies including the CDC and federal and state organizations. These policies and algorithms were followed during the patient's care in the ED.  Patient presents with 2-day onset of urinary frequency and dysuria.  Patient denies vaginal discharge.  Patient has a history of recurrent urinary tract infection.  Discussed lab findings with patient today.  Patient given Bactrim and Pyridium prior to departure.  Patient given discharge care instruction advised follow-up PCP.  Advised patient urine culture is pending.      ____________________________________________   FINAL CLINICAL  IMPRESSION(S) / ED DIAGNOSES  Final diagnoses:  Lower urinary tract infectious disease     ED Discharge Orders         Ordered    sulfamethoxazole-trimethoprim (BACTRIM DS) 800-160 MG tablet  2 times daily     12/15/18 1843    phenazopyridine (PYRIDIUM) 200 MG tablet  3 times daily PRN     12/15/18 1843           Note:  This document was prepared using Dragon voice recognition software and may include unintentional dictation errors.    Sable Feil, PA-C 12/15/18 1850    Duffy Bruce, MD 12/16/18 [phone]

## 2018-12-17 LAB — URINE CULTURE
Culture: <10000 — AB
Special Requests: NORMAL

## 2019-01-05 ENCOUNTER — Other Ambulatory Visit: Payer: Self-pay

## 2019-01-05 ENCOUNTER — Emergency Department
Admission: EM | Admit: 2019-01-05 | Discharge: 2019-01-05 | Disposition: A | Discharge status: Home / Self Care | Payer: Medicare Other | Attending: Emergency Medicine | Admitting: Emergency Medicine

## 2019-01-05 ENCOUNTER — Encounter: Payer: Self-pay | Admitting: Emergency Medicine

## 2019-01-05 DIAGNOSIS — Z79899 Other long term (current) drug therapy: Secondary | ICD-10-CM | POA: Diagnosis not present

## 2019-01-05 DIAGNOSIS — Z7982 Long term (current) use of aspirin: Secondary | ICD-10-CM | POA: Diagnosis not present

## 2019-01-05 DIAGNOSIS — I129 Hypertensive chronic kidney disease with stage 1 through stage 4 chronic kidney disease, or unspecified chronic kidney disease: Secondary | ICD-10-CM | POA: Diagnosis not present

## 2019-01-05 DIAGNOSIS — N189 Chronic kidney disease, unspecified: Secondary | ICD-10-CM | POA: Insufficient documentation

## 2019-01-05 DIAGNOSIS — Z87891 Personal history of nicotine dependence: Secondary | ICD-10-CM | POA: Diagnosis not present

## 2019-01-05 DIAGNOSIS — N390 Urinary tract infection, site not specified: Secondary | ICD-10-CM | POA: Diagnosis not present

## 2019-01-05 DIAGNOSIS — R300 Dysuria: Secondary | ICD-10-CM | POA: Diagnosis present

## 2019-01-05 LAB — URINALYSIS, COMPLETE (UACMP) WITH MICROSCOPIC
Bacteria, UA: NONE SEEN
Bilirubin Urine: NEGATIVE
Glucose, UA: NEGATIVE mg/dL
Hgb urine dipstick: NEGATIVE
Ketones, ur: NEGATIVE mg/dL
Nitrite: NEGATIVE
Protein, ur: NEGATIVE mg/dL
Specific Gravity, Urine: 1.012 (ref 1.005–1.030)
WBC, UA: >50 WBC/hpf — ABNORMAL HIGH (ref 0–5)
pH: 7 (ref 5.0–8.0)

## 2019-01-05 MED ORDER — CIPROFLOXACIN HCL 500 MG PO TABS
500.0000 mg | ORAL_TABLET | Freq: Once | ORAL | Status: AC
  Administered 2019-01-05: 500 mg via ORAL
  Filled 2019-01-05: qty 1

## 2019-01-05 MED ORDER — CIPROFLOXACIN HCL 500 MG PO TABS: 500.0000 mg | ORAL_TABLET | Freq: Two times a day (BID) | ORAL | 0 refills | Status: AC

## 2019-01-05 NOTE — ED Triage Notes (Signed)
Urinary frequency and urgency began today. Denies flank pain.

## 2019-01-05 NOTE — ED Provider Notes (Signed)
[facility] Emergency Department Provider Note  ____________________________________________  Time seen: Approximately 8:00 PM  I have reviewed the triage vital signs and the nursing notes.   HISTORY  Chief Complaint Dysuria    HPI [name] is a 78 y.o. female who presents the emergency department complaining of dysuria, cramping in the suprapubic region.  Patient reports that approximately 2 weeks ago she is suffered from similar symptoms, had a urinary tract infection.  Patient was placed on Bactrim which resolved symptoms.  Patient reports that she has noted that she has had multiple UTIs as she has aged.  Patient  with no fevers or chills, nausea vomiting, diarrhea or constipation.        Past Medical History:  Diagnosis Date  . Arthritis   . Atrial fibrillation ([place])   . Chronic kidney disease    UTI  . GERD (gastroesophageal reflux disease)   . Hypertension     Patient Active Problem List   Diagnosis Date Noted  . 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 BREAST ENHANCEMENT SURGERY    . CHOLECYSTECTOMY    . EYE SURGERY Bilateral    Cataract Extraction  . PARTIAL KNEE ARTHROPLASTY Right 02/24/2015   Procedure: UNICOMPARTMENTAL KNEE;  Surgeon: Corky Mull, MD;  Location: [facility] ORS;  Service: Orthopedics;  Laterality: Right;  . TONSILLECTOMY      Prior to Admission medications   Medication Sig Start Date End Date Taking? Authorizing Provider  aspirin (BAYER ASPIRIN EC LOW DOSE) 81 MG EC tablet Take 81 mg by mouth daily. Swallow whole.    [provider]  Biotin 5000 MCG CAPS Take 1 capsule by mouth daily.    [provider]  bisacodyl (DULCOLAX) 5 MG EC tablet Take 5 mg by mouth daily.    [provider]  Calcium Carb-Cholecalciferol (CALTRATE 600+D) 600-800 MG-UNIT TABS Take 1 tablet by mouth daily.    [provider]   ciprofloxacin (CIPRO) 500 MG tablet Take 1 tablet (500 mg total) by mouth 2 (two) times daily for 7 days. 01/05/19 01/12/19  Alyn Riedinger, Charline Bills, PA-C  Cranberry 400 MG CAPS Take 1 capsule by mouth daily.    [provider]  estradiol (ESTRACE) 0.1 MG/GM vaginal cream Place 1 Applicatorful vaginally daily.    [provider]  fluticasone (FLONASE) 50 MCG/ACT nasal spray Place 1 spray into both nostrils daily.    [provider]  lisinopril-hydrochlorothiazide (PRINZIDE,ZESTORETIC) 20-12.5 MG per tablet Take 1 tablet by mouth daily.    [provider]  Misc Natural Products (OSTEO BI-FLEX TRIPLE STRENGTH) TABS Take 1 tablet by mouth 2 (two) times daily.    [provider]  Multiple Vitamins-Minerals (CENTRUM PO) Take 1 tablet by mouth daily.    [provider]  omeprazole (PRILOSEC) 20 MG capsule Take 20 mg by mouth at bedtime.    [provider]  phenazopyridine (PYRIDIUM) 200 MG tablet Take 1 tablet (200 mg total) by mouth 3 (three) times daily as needed for pain. 12/15/18   Sable Feil, PA-C  potassium chloride (MICRO-K) 10 MEQ CR capsule Take 10 mEq by mouth daily.    [provider]  Probiotic Product (ALIGN) 4 MG CAPS Take 1 capsule by mouth daily.    [provider]  Propylene Glycol (SYSTANE BALANCE) 0.6 % SOLN Apply 1 drop to eye 2 (two) times daily.    [provider]  ranitidine (ZANTAC) 150 MG tablet Take 150 mg by mouth daily.    [provider]  sulfamethoxazole-trimethoprim (BACTRIM DS) 800-160 MG tablet Take 1 tablet by mouth 2 (two) times daily. 12/15/18   Joni ReiningSmith, Ronald K, PA-C  zolpidem (AMBIEN) 10 MG tablet Take 10 mg by mouth at bedtime as needed for sleep.    [provider]    Allergies Ceclor [cefaclor], Clarithromycin, Doxycycline, Macrobid [nitrofurantoin], Meloxicam, and Prednisone  No family history on file.  Social History Social History   Tobacco Use  .  Smoking status: Former Smoker    Packs/day: 1.00    Types: Cigarettes    Quit date: 01/19/1999    Years since quitting: 19.9  . Smokeless tobacco: Never Used  Substance Use Topics  . Alcohol use: Yes    Comment: social  . Drug use: No     Review of Systems  Constitutional: No fever/chills Eyes: No visual changes. No discharge ENT: No upper respiratory complaints. Cardiovascular: no chest pain. Respiratory: no cough. No SOB. Gastrointestinal: No abdominal pain.  No nausea, no vomiting.  No diarrhea.  No constipation. Genitourinary: Positive for dysuria dysuria. No hematuria Musculoskeletal: Negative for musculoskeletal pain. Skin: Negative for rash, abrasions, lacerations, ecchymosis. Neurological: Negative for headaches, focal weakness or numbness. 10-point ROS otherwise negative.  ____________________________________________   PHYSICAL EXAM:  VITAL SIGNS: ED Triage Vitals  Enc Vitals Group     BP 01/05/19 1818 109/80     Pulse Rate 01/05/19 1818 69     Resp 01/05/19 1818 18     Temp 01/05/19 1818 97.7 F (36.5 C)     Temp Source 01/05/19 1818 Oral     SpO2 01/05/19 1818 98 %     Weight 01/05/19 1819 185 lb 3 oz (84 kg)     Height 01/05/19 1819 5\' 6"  (1.676 m)     Head Circumference --      Peak Flow --      Pain Score 01/05/19 1819 0     Pain Loc --      Pain Edu? --      Excl. in GC? --      Constitutional: Alert and oriented. Well appearing and in no acute distress. Eyes: Conjunctivae are normal. PERRL. EOMI. Head: Atraumatic. ENT:      Ears:       Nose: No congestion/rhinnorhea.      Mouth/Throat: Mucous membranes are moist.  Neck: No stridor.    Cardiovascular: Normal rate, regular rhythm. Normal S1 and S2.  Good peripheral circulation. Respiratory: Normal respiratory effort without tachypnea or retractions. Lungs CTAB. Good air entry to the bases with no decreased or absent breath sounds. Gastrointestinal: Bowel sounds 4 quadrants. Soft and nontender  to palpation. No guarding or rigidity. No palpable masses. No distention. No CVA tenderness. Musculoskeletal: Full range of motion to all extremities. No gross deformities appreciated. Neurologic:  Normal speech and language. No gross focal neurologic deficits are appreciated.  Skin:  Skin is warm, dry and intact. No rash noted. Psychiatric: Mood and affect are normal. Speech and behavior are normal. Patient exhibits appropriate insight and judgement.   ____________________________________________   LABS (all labs ordered are listed, but only abnormal results are displayed)  Labs Reviewed  URINALYSIS, COMPLETE (UACMP) WITH MICROSCOPIC - Abnormal; Notable for the following components:      Result Value   Color, Urine YELLOW (*)    APPearance CLOUDY (*)    Leukocytes,Ua LARGE (*)    WBC, UA >50 (*)  All other components within normal limits  URINE CULTURE   ____________________________________________  EKG   ____________________________________________  RADIOLOGY   No results found.  ____________________________________________    PROCEDURES  Procedure(s) performed:    Procedures    Medications  ciprofloxacin (CIPRO) tablet 500 mg (500 mg Oral Given 01/05/19 2000)     ____________________________________________   INITIAL IMPRESSION / ASSESSMENT AND PLAN / ED COURSE  Pertinent labs & imaging results that were available during my care of the patient were reviewed by me and considered in my medical decision making (see chart for details).  Review of the [place] CSRS was performed in accordance of the [facility] prior to dispensing any controlled drugs.           Patient's diagnosis is consistent with urinary tract infection.  Patient presented to emergency department complaining of dysuria starting today.  Patient had a recent UTI and states that she has had recurrent UTIs.  Patient was treated with Bactrim which she states cleared his symptoms until they returned  today.  No fevers or chills.  No concerning symptoms.  Exam is reassuring.  Urinalysis returns with large amount of leukocytes.  No nitrites.  Given patient's symptoms, I will treat her with antibiotics.  I have ordered a culture as well.  I will place the patient on Cipro at this time as patient is allergic to multiple other antibiotics and was on Bactrim last.  Follow-up primary care as needed.. Patient is given ED precautions to return to the ED for any worsening or new symptoms.     ____________________________________________  FINAL CLINICAL IMPRESSION(S) / ED DIAGNOSES  Final diagnoses:  Lower urinary tract infectious disease      NEW MEDICATIONS STARTED DURING THIS VISIT:  ED Discharge Orders         Ordered    ciprofloxacin (CIPRO) 500 MG tablet  2 times daily     01/05/19 2003              This chart was dictated using voice recognition software/Dragon. Despite best efforts to proofread, errors can occur which can change the meaning. Any change was purely unintentional.    Lanette HampshireCuthriell, Malory Spurr D, PA-C 01/05/19 2004    Concha SeFunke, Mary E, MD 01/07/19 [phone]

## 2019-01-08 LAB — URINE CULTURE
Culture: 30000 — AB
Special Requests: NORMAL

## 2019-03-31 ENCOUNTER — Other Ambulatory Visit: Payer: Self-pay

## 2019-03-31 ENCOUNTER — Emergency Department
Admission: EM | Admit: 2019-03-31 | Discharge: 2019-03-31 | Disposition: A | Discharge status: Home / Self Care | Payer: Medicare Other | Attending: Emergency Medicine | Admitting: Emergency Medicine

## 2019-03-31 ENCOUNTER — Encounter: Payer: Self-pay | Admitting: Emergency Medicine

## 2019-03-31 DIAGNOSIS — N189 Chronic kidney disease, unspecified: Secondary | ICD-10-CM | POA: Insufficient documentation

## 2019-03-31 DIAGNOSIS — N3000 Acute cystitis without hematuria: Secondary | ICD-10-CM | POA: Diagnosis not present

## 2019-03-31 DIAGNOSIS — Z87891 Personal history of nicotine dependence: Secondary | ICD-10-CM | POA: Diagnosis not present

## 2019-03-31 DIAGNOSIS — I129 Hypertensive chronic kidney disease with stage 1 through stage 4 chronic kidney disease, or unspecified chronic kidney disease: Secondary | ICD-10-CM | POA: Diagnosis not present

## 2019-03-31 DIAGNOSIS — Z7982 Long term (current) use of aspirin: Secondary | ICD-10-CM | POA: Insufficient documentation

## 2019-03-31 DIAGNOSIS — R300 Dysuria: Secondary | ICD-10-CM | POA: Diagnosis present

## 2019-03-31 DIAGNOSIS — Z79899 Other long term (current) drug therapy: Secondary | ICD-10-CM | POA: Diagnosis not present

## 2019-03-31 LAB — URINALYSIS, COMPLETE (UACMP) WITH MICROSCOPIC
Bilirubin Urine: NEGATIVE
Glucose, UA: NEGATIVE mg/dL
Hgb urine dipstick: NEGATIVE
Ketones, ur: NEGATIVE mg/dL
Nitrite: POSITIVE — AB
Protein, ur: NEGATIVE mg/dL
Specific Gravity, Urine: 1.008 (ref 1.005–1.030)
WBC, UA: >50 WBC/hpf — ABNORMAL HIGH (ref 0–5)
pH: 6 (ref 5.0–8.0)

## 2019-03-31 MED ORDER — LEVOFLOXACIN 500 MG PO TABS
500.0000 mg | ORAL_TABLET | Freq: Once | ORAL | Status: AC
  Administered 2019-03-31: 500 mg via ORAL
  Filled 2019-03-31: qty 1

## 2019-03-31 MED ORDER — LEVOFLOXACIN 500 MG PO TABS: 500.0000 mg | ORAL_TABLET | Freq: Every day | ORAL | 0 refills | Status: AC

## 2019-03-31 NOTE — ED Provider Notes (Signed)
[facility] Emergency Department Provider Note  ____________________________________________  Time seen: Approximately 7:01 PM  I have reviewed the triage vital signs and the nursing notes.   HISTORY  Chief Complaint Dysuria    HPI [name] is a 78 y.o. female who presents the emergency department complaining of dysuria, polyuria, urinary pressure.  Patient reports that she has had an increasing history of urinary tract infections.  Patient was seen by myself in July for a repeat urinary tract infection within several weeks of a previous UTI.  Patient has done well over the past 3 months until she developed more symptoms today.  No fevers or chills or flank pain.  No hematuria.  Patient denies any GI complaints.  No abdominal pain or suprapubic pain.  Patient did have a urine culture when I saw her in July that grew out enterococci faecalis.        Past Medical History:  Diagnosis Date  . Arthritis   . Atrial fibrillation (HCC)   . Chronic kidney disease    UTI  . GERD (gastroesophageal reflux disease)   . Hypertension     Patient Active Problem List   Diagnosis Date Noted  . 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 BREAST ENHANCEMENT SURGERY    . CHOLECYSTECTOMY    . EYE SURGERY Bilateral    Cataract Extraction  . PARTIAL KNEE ARTHROPLASTY Right 02/24/2015   Procedure: UNICOMPARTMENTAL KNEE;  Surgeon: Christena Flake, MD;  Location: [facility] ORS;  Service: Orthopedics;  Laterality: Right;  . TONSILLECTOMY      Prior to Admission medications   Medication Sig Start Date End Date Taking? Authorizing Provider  aspirin (BAYER ASPIRIN EC LOW DOSE) 81 MG EC tablet Take 81 mg by mouth daily. Swallow whole.    [provider]  Biotin 5000 MCG CAPS Take 1 capsule by mouth daily.    [provider]  bisacodyl (DULCOLAX) 5 MG EC tablet Take 5 mg by mouth daily.     [provider]  Calcium Carb-Cholecalciferol (CALTRATE 600+D) 600-800 MG-UNIT TABS Take 1 tablet by mouth daily.    [provider]  Cranberry 400 MG CAPS Take 1 capsule by mouth daily.    [provider]  estradiol (ESTRACE) 0.1 MG/GM vaginal cream Place 1 Applicatorful vaginally daily.    [provider]  fluticasone (FLONASE) 50 MCG/ACT nasal spray Place 1 spray into both nostrils daily.    [provider]  levofloxacin (LEVAQUIN) 500 MG tablet Take 1 tablet (500 mg total) by mouth daily for 10 days. 03/31/19 04/10/19  , Delorise Royals, PA-C  lisinopril-hydrochlorothiazide (PRINZIDE,ZESTORETIC) 20-12.5 MG per tablet Take 1 tablet by mouth daily.    [provider]  Misc Natural Products (OSTEO BI-FLEX TRIPLE STRENGTH) TABS Take 1 tablet by mouth 2 (two) times daily.    [provider]  Multiple Vitamins-Minerals (CENTRUM PO) Take 1 tablet by mouth daily.    [provider]  omeprazole (PRILOSEC) 20 MG capsule Take 20 mg by mouth at bedtime.    [provider]  phenazopyridine (PYRIDIUM) 200 MG tablet Take 1 tablet (200 mg total) by mouth 3 (three) times daily as needed for pain. 12/15/18   Joni Reining, PA-C  potassium chloride (MICRO-K) 10 MEQ CR capsule Take 10 mEq by mouth daily.    [provider]  Probiotic Product (ALIGN) 4 MG CAPS Take 1 capsule by mouth  daily.    [provider]  Propylene Glycol (SYSTANE BALANCE) 0.6 % SOLN Apply 1 drop to eye 2 (two) times daily.    [provider]  ranitidine (ZANTAC) 150 MG tablet Take 150 mg by mouth daily.    [provider]  sulfamethoxazole-trimethoprim (BACTRIM DS) 800-160 MG tablet Take 1 tablet by mouth 2 (two) times daily. 12/15/18   Sable Feil, PA-C  zolpidem (AMBIEN) 10 MG tablet Take 10 mg by mouth at bedtime as needed for sleep.    [provider]    Allergies Ceclor [cefaclor], Clarithromycin,  Doxycycline, Macrobid [nitrofurantoin], Meloxicam, and Prednisone  No family history on file.  Social History Social History   Tobacco Use  . Smoking status: Former Smoker    Packs/day: 1.00    Types: Cigarettes    Quit date: 01/19/1999    Years since quitting: 20.2  . Smokeless tobacco: Never Used  Substance Use Topics  . Alcohol use: Yes    Comment: social  . Drug use: No     Review of Systems  Constitutional: No fever/chills Eyes: No visual changes. No discharge ENT: No upper respiratory complaints. Cardiovascular: no chest pain. Respiratory: no cough. No SOB. Gastrointestinal: No abdominal pain.  No nausea, no vomiting.  No diarrhea.  No constipation. Genitourinary: Positive for dysuria, polyuria, urinary urgency.. No hematuria Musculoskeletal: Negative for musculoskeletal pain. Skin: Negative for rash, abrasions, lacerations, ecchymosis. Neurological: Negative for headaches, focal weakness or numbness. 10-point ROS otherwise negative.  ____________________________________________   PHYSICAL EXAM:  VITAL SIGNS: ED Triage Vitals  Enc Vitals Group     BP 03/31/19 1831 (!) 160/64     Pulse Rate 03/31/19 1831 84     Resp 03/31/19 1831 18     Temp --      Temp src --      SpO2 03/31/19 1831 97 %     Weight 03/31/19 1828 185 lb 3 oz (84 kg)     Height 03/31/19 1828 5\' 5"  (1.651 m)     Head Circumference --      Peak Flow --      Pain Score 03/31/19 1828 0     Pain Loc --      Pain Edu? --      Excl. in [place]? --      Constitutional: Alert and oriented. Well appearing and in no acute distress. Eyes: Conjunctivae are normal. PERRL. EOMI. Head: Atraumatic. ENT:      Ears:       Nose: No congestion/rhinnorhea.      Mouth/Throat: Mucous membranes are moist.  Neck: No stridor.    Cardiovascular: Normal rate, regular rhythm. Normal S1 and S2.  Good peripheral circulation. Respiratory: Normal respiratory effort without tachypnea or retractions. Lungs CTAB. Good  air entry to the bases with no decreased or absent breath sounds. Gastrointestinal: Bowel sounds 4 quadrants. Soft and nontender to palpation. No guarding or rigidity. No palpable masses. No distention. No CVA tenderness. Musculoskeletal: Full range of motion to all extremities. No gross deformities appreciated. Neurologic:  Normal speech and language. No gross focal neurologic deficits are appreciated.  Skin:  Skin is warm, dry and intact. No rash noted. Psychiatric: Mood and affect are normal. Speech and behavior are normal. Patient exhibits appropriate insight and judgement.   ____________________________________________   LABS (all labs ordered are listed, but only abnormal results are displayed)  Labs Reviewed  URINE CULTURE  URINALYSIS, COMPLETE (UACMP) WITH MICROSCOPIC   ____________________________________________  EKG  ____________________________________________  RADIOLOGY   No results found.  ____________________________________________    PROCEDURES  Procedure(s) performed:    Procedures    Medications  levofloxacin (LEVAQUIN) tablet 500 mg (has no administration in time range)     ____________________________________________   INITIAL IMPRESSION / ASSESSMENT AND PLAN / ED COURSE  Pertinent labs & imaging results that were available during my care of the patient were reviewed by me and considered in my medical decision making (see chart for details).  Review of the [place] CSRS was performed in accordance of the [facility] prior to dispensing any controlled drugs.  Clinical Course as of Mar 30 2001  Wynelle LinkSun Mar 31, 2019  1956 Patient presented to the emergency department with symptoms consistent with urinary tract infection.  Patient has a a history of same.  I saw the patient 3 months ago with culture growth of enterococci faecalis.  At this time will repeat urinalysis and sent for culture.  Previous culture showed susceptibility to Levaquin.  The Levaquin  will cover this as well as other normal pathogens, patient will be placed on course of Levaquin.  She is instructed to follow-up primary care as well as urology at this point.   [JC]    Clinical Course User Index [JC] , Delorise Royals D, PA-C          Patient's diagnosis is consistent with urinary tract infection.  Patient presented to the emergency department with symptoms consistent with UTI.  Patient has had multiple UTIs this year.  I saw the patient 3 months ago for urinary tract infection.  She responded well to Cipro, however patient had enterococci faecalis growth on culture.  This was susceptible to Levaquin.  I will cover the patient with Levaquin at this time as it will cover normal pathogens as well.  I have recommended the patient follow-up with both primary care as well as urology for ongoing UTIs.  No indication for further labs or imaging.  No indication for admission at this time as there is no indication of systemic infection or pyelonephritis. Patient is given ED precautions to return to the ED for any worsening or new symptoms.     ____________________________________________  FINAL CLINICAL IMPRESSION(S) / ED DIAGNOSES  Final diagnoses:  Acute cystitis without hematuria      NEW MEDICATIONS STARTED DURING THIS VISIT:  ED Discharge Orders         Ordered    levofloxacin (LEVAQUIN) 500 MG tablet  Daily     03/31/19 2001              This chart was dictated using voice recognition software/Dragon. Despite best efforts to proofread, errors can occur which can change the meaning. Any change was purely unintentional.    Lanette Hampshire,  D, PA-C 03/31/19 2003    Jene EveryKinner, Robert, MD 03/31/19 2014

## 2019-03-31 NOTE — ED Triage Notes (Signed)
C/O burning and frequency x 1 day.

## 2019-04-03 LAB — URINE CULTURE
Culture: >=100000 — AB
Special Requests: NORMAL

## 2019-05-19 ENCOUNTER — Other Ambulatory Visit: Payer: Self-pay

## 2019-05-19 ENCOUNTER — Encounter: Payer: Self-pay | Admitting: Emergency Medicine

## 2019-05-19 ENCOUNTER — Emergency Department
Admission: EM | Admit: 2019-05-19 | Discharge: 2019-05-19 | Disposition: A | Discharge status: Home / Self Care | Payer: Medicare Other | Attending: Emergency Medicine | Admitting: Emergency Medicine

## 2019-05-19 DIAGNOSIS — Z5189 Encounter for other specified aftercare: Secondary | ICD-10-CM | POA: Diagnosis not present

## 2019-05-19 DIAGNOSIS — N189 Chronic kidney disease, unspecified: Secondary | ICD-10-CM | POA: Diagnosis not present

## 2019-05-19 DIAGNOSIS — Z96651 Presence of right artificial knee joint: Secondary | ICD-10-CM | POA: Diagnosis not present

## 2019-05-19 DIAGNOSIS — Z87891 Personal history of nicotine dependence: Secondary | ICD-10-CM | POA: Diagnosis not present

## 2019-05-19 DIAGNOSIS — I129 Hypertensive chronic kidney disease with stage 1 through stage 4 chronic kidney disease, or unspecified chronic kidney disease: Secondary | ICD-10-CM | POA: Insufficient documentation

## 2019-05-19 DIAGNOSIS — L089 Local infection of the skin and subcutaneous tissue, unspecified: Secondary | ICD-10-CM | POA: Diagnosis present

## 2019-05-19 DIAGNOSIS — Z79899 Other long term (current) drug therapy: Secondary | ICD-10-CM | POA: Diagnosis not present

## 2019-05-19 MED ORDER — SULFAMETHOXAZOLE-TRIMETHOPRIM 800-160 MG PO TABS: 1.0000 | ORAL_TABLET | Freq: Two times a day (BID) | ORAL | 0 refills | Status: DC

## 2019-05-19 NOTE — ED Provider Notes (Signed)
[facility] Emergency Department Provider Note  ____________________________________________  Time seen: Approximately 4:17 PM  I have reviewed the triage vital signs and the nursing notes.   HISTORY  Chief Complaint Wound Check    HPI [name] is a 78 y.o. female who presents the emergency department concern for wound to the right lower extremity.  Patient reports that she was out in her yard cleaning up a fall and limb when it scratched her leg.  Patient states that she developed some surrounding erythema and the area is tender and she was concerned that it may be getting infected.  Patient thoroughly cleansed the area at the time of infection, has been keeping it wrapped and covered in triple antibiotic ointment.  Patient denies any history of recurrent skin infections.  No fevers or chills.  No other complaints at this time.         Past Medical History:  Diagnosis Date  . Arthritis   . Atrial fibrillation (HCC)   . Chronic kidney disease    UTI  . GERD (gastroesophageal reflux disease)   . Hypertension     Patient Active Problem List   Diagnosis Date Noted  . 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 BREAST ENHANCEMENT SURGERY    . CHOLECYSTECTOMY    . EYE SURGERY Bilateral    Cataract Extraction  . PARTIAL KNEE ARTHROPLASTY Right 02/24/2015   Procedure: UNICOMPARTMENTAL KNEE;  Surgeon: Christena Flake, MD;  Location: [facility] ORS;  Service: Orthopedics;  Laterality: Right;  . TONSILLECTOMY      Prior to Admission medications   Medication Sig Start Date End Date Taking? Authorizing Provider  aspirin (BAYER ASPIRIN EC LOW DOSE) 81 MG EC tablet Take 81 mg by mouth daily. Swallow whole.    [provider]  Biotin 5000 MCG CAPS Take 1 capsule by mouth daily.    [provider]  bisacodyl (DULCOLAX) 5 MG EC tablet Take 5 mg by mouth daily.    [provider]  Calcium Carb-Cholecalciferol (CALTRATE 600+D) 600-800 MG-UNIT TABS Take 1 tablet by mouth daily.    [provider]  Cranberry 400 MG CAPS Take 1 capsule by mouth daily.    [provider]  estradiol (ESTRACE) 0.1 MG/GM vaginal cream Place 1 Applicatorful vaginally daily.    [provider]  fluticasone (FLONASE) 50 MCG/ACT nasal spray Place 1 spray into both nostrils daily.    [provider]  lisinopril-hydrochlorothiazide (PRINZIDE,ZESTORETIC) 20-12.5 MG per tablet Take 1 tablet by mouth daily.    [provider]  Misc Natural Products (OSTEO BI-FLEX TRIPLE STRENGTH) TABS Take 1 tablet by mouth 2 (two) times daily.    [provider]  Multiple Vitamins-Minerals (CENTRUM PO) Take 1 tablet by mouth daily.    [provider]  omeprazole (PRILOSEC) 20 MG capsule Take 20 mg by mouth at bedtime.    [provider]  phenazopyridine (PYRIDIUM) 200 MG tablet Take 1 tablet (200 mg total) by mouth 3 (three) times daily as needed for pain. 12/15/18   Joni Reining, PA-C  potassium chloride (MICRO-K) 10 MEQ CR capsule Take 10 mEq by mouth daily.    [provider]  Probiotic Product (ALIGN) 4 MG CAPS Take 1 capsule by mouth daily.    [provider]  Propylene Glycol (SYSTANE BALANCE) 0.6 % SOLN Apply 1 drop to eye 2 (two) times daily.  [provider]  ranitidine (ZANTAC) 150 MG tablet Take 150 mg by mouth daily.    [provider]  sulfamethoxazole-trimethoprim (BACTRIM DS) 800-160 MG tablet Take 1 tablet by mouth 2 (two) times daily. 12/15/18   Joni ReiningSmith, Ronald K, PA-C  sulfamethoxazole-trimethoprim (BACTRIM DS) 800-160 MG tablet Take 1 tablet by mouth 2 (two) times daily. 05/19/19   Kenniyah Sasaki, Delorise RoyalsJonathan D, PA-C  zolpidem (AMBIEN) 10 MG tablet Take 10 mg by mouth at bedtime as needed for sleep.    [provider]    Allergies Cefaclor, Clarithromycin, Meloxicam,  Nitrofurantoin, Doxycycline, Prednisone, and Zolpidem  No family history on file.  Social History Social History   Tobacco Use  . Smoking status: Former Smoker    Packs/day: 1.00    Types: Cigarettes    Quit date: 01/19/1999    Years since quitting: 20.3  . Smokeless tobacco: Never Used  Substance Use Topics  . Alcohol use: Yes    Comment: social  . Drug use: No     Review of Systems  Constitutional: No fever/chills Eyes: No visual changes. No discharge ENT: No upper respiratory complaints. Cardiovascular: no chest pain. Respiratory: no cough. No SOB. Gastrointestinal: No abdominal pain.  No nausea, no vomiting.   Musculoskeletal: Negative for musculoskeletal pain. Skin: Possible wound infection to the right lower extremity. Neurological: Negative for headaches, focal weakness or numbness. 10-point ROS otherwise negative.  ____________________________________________   PHYSICAL EXAM:  VITAL SIGNS: ED Triage Vitals  Enc Vitals Group     BP 05/19/19 1517 (!) 171/92     Pulse Rate 05/19/19 1517 78     Resp 05/19/19 1517 16     Temp 05/19/19 1517 99.2 F (37.3 C)     Temp Source 05/19/19 1517 Oral     SpO2 05/19/19 1517 94 %     Weight 05/19/19 1522 180 lb (81.6 kg)     Height 05/19/19 1522 5\' 6"  (1.676 m)     Head Circumference --      Peak Flow --      Pain Score 05/19/19 1522 3     Pain Loc --      Pain Edu? --      Excl. in [place]? --      Constitutional: Alert and oriented. Well appearing and in no acute distress. Eyes: Conjunctivae are normal. PERRL. EOMI. Head: Atraumatic. ENT:      Ears:       Nose: No congestion/rhinnorhea.      Mouth/Throat: Mucous membranes are moist.  Neck: No stridor.    Cardiovascular: Normal rate, regular rhythm. Normal S1 and S2.  Good peripheral circulation. Respiratory: Normal respiratory effort without tachypnea or retractions. Lungs CTAB. Good air entry to the bases with no decreased or absent breath  sounds. Musculoskeletal: Full range of motion to all extremities. No gross deformities appreciated. Neurologic:  Normal speech and language. No gross focal neurologic deficits are appreciated.  Skin:  Skin is warm, dry and intact. No rash noted.  Visualization of the right lower extremity reveals healing wound to the lateral aspect of the right calf.  Patient has some subcu tissue exposed with missing epidermal tissue.  Wound is clearly several days old.  Minimal surrounding erythema.  Area is mildly tender to palpation.  No drainage including serosanguineous or purulent.  No streaking.  Wound measures approximately 2 cm x 2 cm.  Surrounding erythema extends approximately 2 cm in all directions. Psychiatric: Mood and affect are normal. Speech and behavior are normal.  Patient exhibits appropriate insight and judgement.   ____________________________________________   LABS (all labs ordered are listed, but only abnormal results are displayed)  Labs Reviewed - No data to display ____________________________________________  EKG   ____________________________________________  RADIOLOGY   No results found.  ____________________________________________    PROCEDURES  Procedure(s) performed:    Procedures    Medications - No data to display   ____________________________________________   INITIAL IMPRESSION / ASSESSMENT AND PLAN / ED COURSE  Pertinent labs & imaging results that were available during my care of the patient were reviewed by me and considered in my medical decision making (see chart for details).  Review of the [place] CSRS was performed in accordance of the [place] prior to dispensing any controlled drugs.           Patient's diagnosis is consistent with wound check.  Patient presented to the emergency department with a several day old wound to the right lower extremity.  Patient was concerned that she may have an infection in her leg.  Patient's wound is  slowly healing at this time.  No evidence of significant infection requiring labs or IV antibiotics.  Wound care instructions discussed at length with the patient, patient will be placed on antibiotics at this time..  Patient will be prescribed Bactrim.  Follow-up primary care as needed.  Patient is given ED precautions to return to the ED for any worsening or new symptoms.     ____________________________________________  FINAL CLINICAL IMPRESSION(S) / ED DIAGNOSES  Final diagnoses:  Visit for wound check      NEW MEDICATIONS STARTED DURING THIS VISIT:  ED Discharge Orders         Ordered    sulfamethoxazole-trimethoprim (BACTRIM DS) 800-160 MG tablet  2 times daily     05/19/19 1622              This chart was dictated using voice recognition software/Dragon. Despite best efforts to proofread, errors can occur which can change the meaning. Any change was purely unintentional.    Darletta Moll, PA-C 05/19/19 1630    Blake Divine, MD 05/20/19 0001

## 2019-05-19 NOTE — ED Notes (Signed)

## 2019-05-19 NOTE — ED Triage Notes (Signed)
Pt arrived via POV with reports of scraping tree limp on right lateral lower leg several days ago, pt states she has been using polysporin and cleaning with peroxide and changing the dressing but states no improvement.

## 2019-05-19 NOTE — Discharge Instructions (Signed)
Keep the wound dressed when wearing pants or going out.  If you are at home resting you may leave the wound uncovered.  Use one of the gauze pads, wrapped with Ace bandage.  Do not apply any topicals including antibiotic ointment.  Take the antibiotic prescribed to morning and night.  Allow the wound to scab up.  I have marked the edges with a marker.  If you see redness extending 2 inches past any of the marks, return to the emergency department.  Return to the emergency department for any worsening redness, pussy drainage, or red streaks extending up the leg.  This will take some time to heal, but once you have a nice scab in place the risk of infection is low.  Follow-up with primary care, Dr. Sabra Heck, as needed.

## 2019-07-14 ENCOUNTER — Emergency Department
Admission: EM | Admit: 2019-07-14 | Discharge: 2019-07-14 | Disposition: A | Discharge status: Home / Self Care | Payer: Medicare Other | Attending: Emergency Medicine | Admitting: Emergency Medicine

## 2019-07-14 ENCOUNTER — Other Ambulatory Visit: Payer: Self-pay

## 2019-07-14 ENCOUNTER — Encounter: Payer: Self-pay | Admitting: Intensive Care

## 2019-07-14 DIAGNOSIS — I10 Essential (primary) hypertension: Secondary | ICD-10-CM | POA: Diagnosis not present

## 2019-07-14 DIAGNOSIS — Z7982 Long term (current) use of aspirin: Secondary | ICD-10-CM | POA: Insufficient documentation

## 2019-07-14 DIAGNOSIS — Z79899 Other long term (current) drug therapy: Secondary | ICD-10-CM | POA: Diagnosis not present

## 2019-07-14 DIAGNOSIS — N3090 Cystitis, unspecified without hematuria: Secondary | ICD-10-CM | POA: Insufficient documentation

## 2019-07-14 DIAGNOSIS — Z87891 Personal history of nicotine dependence: Secondary | ICD-10-CM | POA: Diagnosis not present

## 2019-07-14 DIAGNOSIS — R300 Dysuria: Secondary | ICD-10-CM | POA: Diagnosis present

## 2019-07-14 LAB — URINALYSIS, COMPLETE (UACMP) WITH MICROSCOPIC
Bilirubin Urine: NEGATIVE
Glucose, UA: NEGATIVE mg/dL
Hgb urine dipstick: NEGATIVE
Ketones, ur: NEGATIVE mg/dL
Nitrite: POSITIVE — AB
Protein, ur: NEGATIVE mg/dL
Specific Gravity, Urine: 1.011 (ref 1.005–1.030)
WBC, UA: >50 WBC/hpf — ABNORMAL HIGH (ref 0–5)
pH: 6 (ref 5.0–8.0)

## 2019-07-14 MED ORDER — SULFAMETHOXAZOLE-TRIMETHOPRIM 800-160 MG PO TABS: 1.0000 | ORAL_TABLET | Freq: Two times a day (BID) | ORAL | 0 refills | Status: AC

## 2019-07-14 NOTE — ED Triage Notes (Signed)
Reports started feeling symptoms of UTI last night with urinary frequency with little results. Reports some pain with urination

## 2019-07-14 NOTE — ED Provider Notes (Signed)
Emergency Department Provider Note  ____________________________________________  Time seen: Approximately 6:47 PM  I have reviewed the triage vital signs and the nursing notes.   HISTORY  Chief Complaint Recurrent UTI   Historian Patient     HPI [name] is a 79 y.o. female presents to the emergency department with dysuria and increased urinary frequency that started last night.  Patient states that she has had 2 other UTIs this year and states that she has a urinary tract infection when she has a bowel movement and urinates at the same time.  She denies fever or chills at home.  No nausea or vomiting.  No flank pain.  She denies abdominal pain.  No other alleviating measures have been attempted.   Past Medical History:  Diagnosis Date  . Arthritis   . Atrial fibrillation (HCC)   . Chronic kidney disease    UTI  . GERD (gastroesophageal reflux disease)   . Hypertension      Immunizations up to date:  Yes.     Past Medical History:  Diagnosis Date  . Arthritis   . Atrial fibrillation (HCC)   . Chronic kidney disease    UTI  . GERD (gastroesophageal reflux disease)   . Hypertension     Patient Active Problem List   Diagnosis Date Noted  . 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 BREAST ENHANCEMENT SURGERY    . CHOLECYSTECTOMY    . EYE SURGERY Bilateral    Cataract Extraction  . PARTIAL KNEE ARTHROPLASTY Right 02/24/2015   Procedure: UNICOMPARTMENTAL KNEE;  Surgeon: Christena Flake, MD;  Location: [facility] ORS;  Service: Orthopedics;  Laterality: Right;  . TONSILLECTOMY      Prior to Admission medications   Medication Sig Start Date End Date Taking? Authorizing Provider  aspirin (BAYER ASPIRIN EC LOW DOSE) 81 MG EC tablet Take 81 mg by mouth daily. Swallow whole.    [provider]  Biotin 5000 MCG CAPS Take 1 capsule by mouth daily.    [provider]  bisacodyl  (DULCOLAX) 5 MG EC tablet Take 5 mg by mouth daily.    [provider]  Calcium Carb-Cholecalciferol (CALTRATE 600+D) 600-800 MG-UNIT TABS Take 1 tablet by mouth daily.    [provider]  Cranberry 400 MG CAPS Take 1 capsule by mouth daily.    [provider]  estradiol (ESTRACE) 0.1 MG/GM vaginal cream Place 1 Applicatorful vaginally daily.    [provider]  fluticasone (FLONASE) 50 MCG/ACT nasal spray Place 1 spray into both nostrils daily.    [provider]  lisinopril-hydrochlorothiazide (PRINZIDE,ZESTORETIC) 20-12.5 MG per tablet Take 1 tablet by mouth daily.    [provider]  Misc Natural Products (OSTEO BI-FLEX TRIPLE STRENGTH) TABS Take 1 tablet by mouth 2 (two) times daily.    [provider]  Multiple Vitamins-Minerals (CENTRUM PO) Take 1 tablet by mouth daily.    [provider]  omeprazole (PRILOSEC) 20 MG capsule Take 20 mg by mouth at bedtime.    [provider]  phenazopyridine (PYRIDIUM) 200 MG tablet Take 1 tablet (200 mg total) by mouth 3 (three) times daily as needed for pain. 12/15/18   Joni Reining, PA-C  potassium chloride (MICRO-K) 10 MEQ CR capsule Take 10 mEq by mouth daily.    [provider]  Probiotic Product (ALIGN) 4 MG CAPS Take 1 capsule by mouth daily.  [provider]  Propylene Glycol (SYSTANE BALANCE) 0.6 % SOLN Apply 1 drop to eye 2 (two) times daily.    [provider]  ranitidine (ZANTAC) 150 MG tablet Take 150 mg by mouth daily.    [provider]  sulfamethoxazole-trimethoprim (BACTRIM DS) 800-160 MG tablet Take 1 tablet by mouth 2 (two) times daily for 7 days. 07/14/19 07/21/19  Orvil Feil, PA-C  zolpidem (AMBIEN) 10 MG tablet Take 10 mg by mouth at bedtime as needed for sleep.    [provider]    Allergies Cefaclor, Clarithromycin, Meloxicam, Nitrofurantoin, Doxycycline, Prednisone, and Zolpidem  History reviewed.  No pertinent family history.  Social History Social History   Tobacco Use  . Smoking status: Former Smoker    Packs/day: 1.00    Types: Cigarettes    Quit date: 01/19/1999    Years since quitting: 20.4  . Smokeless tobacco: Never Used  Substance Use Topics  . Alcohol use: Yes    Comment: social  . Drug use: No     Review of Systems  Constitutional: No fever/chills Eyes:  No discharge ENT: No upper respiratory complaints. Respiratory: no cough. No SOB/ use of accessory muscles to breath Gastrointestinal:   No nausea, no vomiting.  No diarrhea.  No constipation. Genitourinary: Patient has dysuria and increased urinary frequency. Musculoskeletal: Negative for musculoskeletal pain. Skin: Negative for rash, abrasions, lacerations, ecchymosis.    ____________________________________________   PHYSICAL EXAM:  VITAL SIGNS: ED Triage Vitals [07/14/19 1542]  Enc Vitals Group     BP (!) 175/81     Pulse Rate 73     Resp 16     Temp 98.4 F (36.9 C)     Temp Source Oral     SpO2 96 %     Weight      Height 5\' 6"  (1.676 m)     Head Circumference      Peak Flow      Pain Score 3     Pain Loc      Pain Edu?      Excl. in GC?      Constitutional: Alert and oriented. Well appearing and in no acute distress. Eyes: Conjunctivae are normal. PERRL. EOMI. Head: Atraumatic. ENT: Cardiovascular: Normal rate, regular rhythm. Normal S1 and S2.  Good peripheral circulation. Respiratory: Normal respiratory effort without tachypnea or retractions. Lungs CTAB. Good air entry to the bases with no decreased or absent breath sounds Gastrointestinal: Bowel sounds x 4 quadrants. Soft and nontender to palpation. No guarding or rigidity. No distention. Musculoskeletal: Full range of motion to all extremities. No obvious deformities noted Neurologic:  Normal for age. No gross focal neurologic deficits are appreciated.  Skin:  Skin is warm, dry and intact. No rash noted. Psychiatric: Mood  and affect are normal for age. Speech and behavior are normal.   ____________________________________________   LABS (all labs ordered are listed, but only abnormal results are displayed)  Labs Reviewed  URINALYSIS, COMPLETE (UACMP) WITH MICROSCOPIC - Abnormal; Notable for the following components:      Result Value   Color, Urine AMBER (*)    APPearance CLOUDY (*)    Nitrite POSITIVE (*)    Leukocytes,Ua MODERATE (*)    WBC, UA >50 (*)    Bacteria, UA RARE (*)    All other components within normal limits   ____________________________________________  EKG   ____________________________________________  RADIOLOGY  No results found.  ____________________________________________    PROCEDURES  Procedure(s) performed:  Procedures     Medications - No data to display   ____________________________________________   INITIAL IMPRESSION / ASSESSMENT AND PLAN / ED COURSE  Pertinent labs & imaging results that were available during my care of the patient were reviewed by me and considered in my medical decision making (see chart for details).      Assessment and plan Cystitis 79 year old female presents to the emergency department with dysuria and increased urinary frequency that started last night.  Urinalysis is concerning for cystitis with nitrates and a moderate amount of leuks but no blood.  Patient was discharged with Keflex.  Return precautions were given to return with new or worsening symptoms.  All patient questions were answered.    ____________________________________________  FINAL CLINICAL IMPRESSION(S) / ED DIAGNOSES  Final diagnoses:  Cystitis      NEW MEDICATIONS STARTED DURING THIS VISIT:  ED Discharge Orders         Ordered    sulfamethoxazole-trimethoprim (BACTRIM DS) 800-160 MG tablet  2 times daily     07/14/19 1842              This chart was dictated using voice recognition software/Dragon. Despite best efforts  to proofread, errors can occur which can change the meaning. Any change was purely unintentional.     Lannie Fields, PA-C 07/14/19 1851    Earleen Newport, MD 07/14/19 1859

## 2019-10-11 ENCOUNTER — Other Ambulatory Visit: Payer: Self-pay

## 2019-10-11 ENCOUNTER — Emergency Department
Admission: EM | Admit: 2019-10-11 | Discharge: 2019-10-11 | Disposition: A | Discharge status: Home / Self Care | Payer: Medicare Other | Attending: Student in an Organized Health Care Education/Training Program | Admitting: Student in an Organized Health Care Education/Training Program

## 2019-10-11 DIAGNOSIS — R300 Dysuria: Secondary | ICD-10-CM | POA: Diagnosis present

## 2019-10-11 DIAGNOSIS — I129 Hypertensive chronic kidney disease with stage 1 through stage 4 chronic kidney disease, or unspecified chronic kidney disease: Secondary | ICD-10-CM | POA: Diagnosis not present

## 2019-10-11 DIAGNOSIS — N189 Chronic kidney disease, unspecified: Secondary | ICD-10-CM | POA: Diagnosis not present

## 2019-10-11 DIAGNOSIS — Z79899 Other long term (current) drug therapy: Secondary | ICD-10-CM | POA: Diagnosis not present

## 2019-10-11 DIAGNOSIS — N3090 Cystitis, unspecified without hematuria: Secondary | ICD-10-CM | POA: Insufficient documentation

## 2019-10-11 LAB — URINALYSIS, COMPLETE (UACMP) WITH MICROSCOPIC
Bilirubin Urine: NEGATIVE
Glucose, UA: NEGATIVE mg/dL
Hgb urine dipstick: NEGATIVE
Ketones, ur: NEGATIVE mg/dL
Nitrite: POSITIVE — AB
Protein, ur: NEGATIVE mg/dL
Specific Gravity, Urine: 1.006 (ref 1.005–1.030)
WBC, UA: >50 WBC/hpf — ABNORMAL HIGH (ref 0–5)
pH: 6 (ref 5.0–8.0)

## 2019-10-11 MED ORDER — SULFAMETHOXAZOLE-TRIMETHOPRIM 800-160 MG PO TABS
1.0000 | ORAL_TABLET | Freq: Two times a day (BID) | ORAL | 0 refills | Status: DC
Start: 2019-10-11 — End: 2019-10-14

## 2019-10-11 MED ORDER — SULFAMETHOXAZOLE-TRIMETHOPRIM 800-160 MG PO TABS
1.0000 | ORAL_TABLET | Freq: Once | ORAL | Status: AC
  Administered 2019-10-11: 1 via ORAL
  Filled 2019-10-11: qty 1

## 2019-10-11 NOTE — ED Provider Notes (Signed)
[facility] Emergency Department Provider Note  ____________________________________________  Time seen: Approximately 10:03 PM  I have reviewed the triage vital signs and the nursing notes.   HISTORY  Chief Complaint Dysuria    HPI [name] is a 79 y.o. female with PMH of recurrent UTI, CKD, HTN that presents to the emergency department for evaluation of dysuria, urinary frequency, and urinary pressure for 1 day.  Patient states that since she turned 70, she has frequent urinary tract infections.  Symptoms today feel the same.  She states that urinary tract infection start when she has to both urinate and have a bowel movement at the same time.  She tries to use tissue to prevent any backslash but states that every time she gets a urinary tract infection.  She states that the antibiotic she received in the emergency department previously worked.  She usually follows up with primary care but states that she sometimes needs to come to the emergency department when the infections happened over the weekend.  No fevers, abdominal pain.   Past Medical History:  Diagnosis Date  . Arthritis   . Atrial fibrillation ([place])   . Chronic kidney disease    UTI  . GERD (gastroesophageal reflux disease)   . Hypertension     Patient Active Problem List   Diagnosis Date Noted  . 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 BREAST ENHANCEMENT SURGERY    . CHOLECYSTECTOMY    . EYE SURGERY Bilateral    Cataract Extraction  . PARTIAL KNEE ARTHROPLASTY Right 02/24/2015   Procedure: UNICOMPARTMENTAL KNEE;  Surgeon: Corky Mull, MD;  Location: [facility] ORS;  Service: Orthopedics;  Laterality: Right;  . TONSILLECTOMY      Prior to Admission medications   Medication Sig Start Date End Date Taking? Authorizing Provider  aspirin (BAYER ASPIRIN EC LOW DOSE) 81 MG EC tablet Take 81 mg by mouth daily. Swallow  whole.    [provider]  Biotin 5000 MCG CAPS Take 1 capsule by mouth daily.    [provider]  bisacodyl (DULCOLAX) 5 MG EC tablet Take 5 mg by mouth daily.    [provider]  Calcium Carb-Cholecalciferol (CALTRATE 600+D) 600-800 MG-UNIT TABS Take 1 tablet by mouth daily.    [provider]  Cranberry 400 MG CAPS Take 1 capsule by mouth daily.    [provider]  estradiol (ESTRACE) 0.1 MG/GM vaginal cream Place 1 Applicatorful vaginally daily.    [provider]  fluticasone (FLONASE) 50 MCG/ACT nasal spray Place 1 spray into both nostrils daily.    [provider]  lisinopril-hydrochlorothiazide (PRINZIDE,ZESTORETIC) 20-12.5 MG per tablet Take 1 tablet by mouth daily.    [provider]  Misc Natural Products (OSTEO BI-FLEX TRIPLE STRENGTH) TABS Take 1 tablet by mouth 2 (two) times daily.    [provider]  Multiple Vitamins-Minerals (CENTRUM PO) Take 1 tablet by mouth daily.    [provider]  omeprazole (PRILOSEC) 20 MG capsule Take 20 mg by mouth at bedtime.    [provider]  phenazopyridine (PYRIDIUM) 200 MG tablet Take 1 tablet (200 mg total) by mouth 3 (three) times daily as needed for pain. 12/15/18   Sable Feil, PA-C  potassium chloride (MICRO-K) 10 MEQ CR capsule Take 10 mEq by mouth daily.    [provider]  Probiotic Product (ALIGN) 4 MG CAPS Take 1 capsule by  mouth daily.    [provider]  Propylene Glycol (SYSTANE BALANCE) 0.6 % SOLN Apply 1 drop to eye 2 (two) times daily.    [provider]  ranitidine (ZANTAC) 150 MG tablet Take 150 mg by mouth daily.    [provider]  sulfamethoxazole-trimethoprim (BACTRIM DS) 800-160 MG tablet Take 1 tablet by mouth 2 (two) times daily. 10/11/19   Enid Derry, PA-C  zolpidem (AMBIEN) 10 MG tablet Take 10 mg by mouth at bedtime as needed for sleep.    [provider]     Allergies Cefaclor, Clarithromycin, Meloxicam, Nitrofurantoin, Doxycycline, Keflex [cephalexin], Prednisone, and Zolpidem  No family history on file.  Social History Social History   Tobacco Use  . Smoking status: Former Smoker    Packs/day: 1.00    Types: Cigarettes    Quit date: 01/19/1999    Years since quitting: 20.7  . Smokeless tobacco: Never Used  Substance Use Topics  . Alcohol use: Yes    Comment: social  . Drug use: No     Review of Systems  Constitutional: No fever/chills Respiratory: No SOB. Gastrointestinal: No abdominal pain.  No nausea, no vomiting.  Genitourinary: Positive for dysuria, frequency. Musculoskeletal: Negative for musculoskeletal pain. Skin: Negative for rash, abrasions, lacerations, ecchymosis.   ____________________________________________   PHYSICAL EXAM:  VITAL SIGNS: ED Triage Vitals  Enc Vitals Group     BP 10/11/19 2109 (!) 143/62     Pulse Rate 10/11/19 2112 76     Resp 10/11/19 2109 18     Temp 10/11/19 2109 98.1 F (36.7 C)     Temp Source 10/11/19 2109 Oral     SpO2 10/11/19 2109 99 %     Weight 10/11/19 2110 179 lb 14.3 oz (81.6 kg)     Height 10/11/19 2110 5\' 5"  (1.651 m)     Head Circumference --      Peak Flow --      Pain Score 10/11/19 2110 0     Pain Loc --      Pain Edu? --      Excl. in GC? --      Constitutional: Alert and oriented. Well appearing and in no acute distress. Eyes: Conjunctivae are normal. PERRL. EOMI. Head: Atraumatic. ENT:      Ears:      Nose: No congestion/rhinnorhea.      Mouth/Throat: Mucous membranes are moist.  Neck: No stridor.   Cardiovascular: Normal rate, regular rhythm.  Good peripheral circulation. Respiratory: Normal respiratory effort without tachypnea or retractions. Lungs CTAB. Good air entry to the bases with no decreased or absent breath sounds. Gastrointestinal: Bowel sounds 4 quadrants. Soft and nontender to palpation. No guarding or rigidity. No palpable  masses. No distention.  Musculoskeletal: Full range of motion to all extremities. No gross deformities appreciated. Neurologic:  Normal speech and language. No gross focal neurologic deficits are appreciated.  Skin:  Skin is warm, dry and intact. No rash noted. Psychiatric: Mood and affect are normal. Speech and behavior are normal. Patient exhibits appropriate insight and judgement.   ____________________________________________   LABS (all labs ordered are listed, but only abnormal results are displayed)  Labs Reviewed  URINALYSIS, COMPLETE (UACMP) WITH MICROSCOPIC - Abnormal; Notable for the following components:      Result Value   Color, Urine YELLOW (*)    APPearance CLOUDY (*)    Nitrite POSITIVE (*)    Leukocytes,Ua LARGE (*)    WBC, UA >50 (*)  Bacteria, UA RARE (*)    All other components within normal limits   ____________________________________________  EKG   ____________________________________________  RADIOLOGY  No results found.  ____________________________________________    PROCEDURES  Procedure(s) performed:    Procedures    Medications  sulfamethoxazole-trimethoprim (BACTRIM DS) 800-160 MG per tablet 1 tablet (1 tablet Oral Given 10/11/19 2234)     ____________________________________________   INITIAL IMPRESSION / ASSESSMENT AND PLAN / ED COURSE  Pertinent labs & imaging results that were available during my care of the patient were reviewed by me and considered in my medical decision making (see chart for details).  Review of the [place] CSRS was performed in accordance of the [facility] prior to dispensing any controlled drugs.   Patient presented to emergency department for evaluation of recurrent UTI.  Vital signs and exam are reassuring.  Urinalysis contributory for cystitis.  Patient denies any systemic symptoms.  She is talkative and enjoys talking about dancing pre Covid with the [employer].  Patient will be discharged home  with prescriptions for Bactrim. Patient is to follow up with primary care as directed. Patient is given ED precautions to return to the ED for any worsening or new symptoms.  JAELINE WHOBREY was evaluated in Emergency Department on 10/11/2019 for the symptoms described in the history of present illness. She was evaluated in the context of the global COVID-19 pandemic, which necessitated consideration that the patient might be at risk for infection with the SARS-CoV-2 virus that causes COVID-19. Institutional protocols and algorithms that pertain to the evaluation of patients at risk for COVID-19 are in a state of rapid change based on information released by regulatory bodies including the CDC and federal and state organizations. These policies and algorithms were followed during the patient's care in the ED.   ____________________________________________  FINAL CLINICAL IMPRESSION(S) / ED DIAGNOSES  Final diagnoses:  Cystitis      NEW MEDICATIONS STARTED DURING THIS VISIT:  ED Discharge Orders         Ordered    sulfamethoxazole-trimethoprim (BACTRIM DS) 800-160 MG tablet  2 times daily     10/11/19 2239              This chart was dictated using voice recognition software/Dragon. Despite best efforts to proofread, errors can occur which can change the meaning. Any change was purely unintentional.    Enid Derry, PA-C 10/11/19 2258    Willy Eddy, MD 10/12/19 0001

## 2019-10-11 NOTE — ED Triage Notes (Signed)
Patient c/o urinary pressure and pain with urination for a couple days. Patient reports hx of multiple UTIs.

## 2019-10-14 ENCOUNTER — Emergency Department
Admission: EM | Admit: 2019-10-14 | Discharge: 2019-10-14 | Disposition: A | Discharge status: Home / Self Care | Payer: Medicare Other | Attending: Emergency Medicine | Admitting: Emergency Medicine

## 2019-10-14 ENCOUNTER — Encounter: Payer: Self-pay | Admitting: Emergency Medicine

## 2019-10-14 ENCOUNTER — Other Ambulatory Visit: Payer: Self-pay

## 2019-10-14 DIAGNOSIS — Z87891 Personal history of nicotine dependence: Secondary | ICD-10-CM | POA: Diagnosis not present

## 2019-10-14 DIAGNOSIS — I129 Hypertensive chronic kidney disease with stage 1 through stage 4 chronic kidney disease, or unspecified chronic kidney disease: Secondary | ICD-10-CM | POA: Diagnosis not present

## 2019-10-14 DIAGNOSIS — N3001 Acute cystitis with hematuria: Secondary | ICD-10-CM | POA: Diagnosis not present

## 2019-10-14 DIAGNOSIS — I4891 Unspecified atrial fibrillation: Secondary | ICD-10-CM | POA: Diagnosis not present

## 2019-10-14 DIAGNOSIS — Z7982 Long term (current) use of aspirin: Secondary | ICD-10-CM | POA: Diagnosis not present

## 2019-10-14 DIAGNOSIS — N189 Chronic kidney disease, unspecified: Secondary | ICD-10-CM | POA: Diagnosis not present

## 2019-10-14 DIAGNOSIS — R21 Rash and other nonspecific skin eruption: Secondary | ICD-10-CM | POA: Diagnosis present

## 2019-10-14 DIAGNOSIS — Z79899 Other long term (current) drug therapy: Secondary | ICD-10-CM | POA: Insufficient documentation

## 2019-10-14 LAB — URINALYSIS, COMPLETE (UACMP) WITH MICROSCOPIC
Glucose, UA: NEGATIVE mg/dL
Nitrite: NEGATIVE
Protein, ur: 100 mg/dL — AB
Specific Gravity, Urine: >1.03 — ABNORMAL HIGH (ref 1.005–1.030)
WBC, UA: >50 WBC/hpf (ref 0–5)
pH: 5.5 (ref 5.0–8.0)

## 2019-10-14 MED ORDER — FOSFOMYCIN TROMETHAMINE 3 G PO PACK
3.0000 g | PACK | Freq: Once | ORAL | Status: AC
  Administered 2019-10-14: 3 g via ORAL
  Filled 2019-10-14: qty 3

## 2019-10-14 MED ORDER — LORATADINE 10 MG PO TABS
10.0000 mg | ORAL_TABLET | Freq: Every day | ORAL | Status: DC
  Administered 2019-10-14: 10 mg via ORAL

## 2019-10-14 MED ORDER — CETIRIZINE HCL 10 MG PO TABS: 10.0000 mg | ORAL_TABLET | Freq: Every day | ORAL | 0 refills | Status: AC

## 2019-10-14 MED ORDER — SULFAMETHOXAZOLE-TRIMETHOPRIM 800-160 MG PO TABS: 1.0000 | ORAL_TABLET | Freq: Once | ORAL | Status: DC

## 2019-10-14 NOTE — ED Provider Notes (Signed)
[facility] Emergency Department Provider Note  ____________________________________________  Time seen: Approximately 6:30 AM  I have reviewed the triage vital signs and the nursing notes.   HISTORY  Chief Complaint Urinary Tract Infection and Rash   HPI [name] is a 79 y.o. female with a history of chronic UTIs who presents for evaluation of an allergic reaction to Bactrim.  Patient was seen here 3 days ago and diagnosed with a UTI.  She was started on Bactrim.  After taking the second p.o. she described diffuse pruritus and a mild rash.  No angioedema, no throat closing sensation, no shortness of breath, no stridor, no wheezing, no vomiting or diarrhea.  No abdominal pain, no fever, no flank pain.  She has taken Bactrim in the past with no allergic reaction.   Past Medical History:  Diagnosis Date  . Arthritis   . Atrial fibrillation (HCC)   . Chronic kidney disease    UTI  . GERD (gastroesophageal reflux disease)   . Hypertension     Patient Active Problem List   Diagnosis Date Noted  . 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 BREAST ENHANCEMENT SURGERY    . CHOLECYSTECTOMY    . EYE SURGERY Bilateral    Cataract Extraction  . PARTIAL KNEE ARTHROPLASTY Right 02/24/2015   Procedure: UNICOMPARTMENTAL KNEE;  Surgeon: Christena Flake, MD;  Location: [facility] ORS;  Service: Orthopedics;  Laterality: Right;  . TONSILLECTOMY      Prior to Admission medications   Medication Sig Start Date End Date Taking? Authorizing Provider  aspirin (BAYER ASPIRIN EC LOW DOSE) 81 MG EC tablet Take 81 mg by mouth daily. Swallow whole.    [provider]  Biotin 5000 MCG CAPS Take 1 capsule by mouth daily.    [provider]  bisacodyl (DULCOLAX) 5 MG EC tablet Take 5 mg by mouth daily.    [provider]  Calcium Carb-Cholecalciferol (CALTRATE 600+D) 600-800 MG-UNIT  TABS Take 1 tablet by mouth daily.    [provider]  cetirizine (ZYRTEC ALLERGY) 10 MG tablet Take 1 tablet (10 mg total) by mouth daily for 7 days. 10/14/19 10/21/19  Nita Sickle, MD  Cranberry 400 MG CAPS Take 1 capsule by mouth daily.    [provider]  estradiol (ESTRACE) 0.1 MG/GM vaginal cream Place 1 Applicatorful vaginally daily.    [provider]  fluticasone (FLONASE) 50 MCG/ACT nasal spray Place 1 spray into both nostrils daily.    [provider]  lisinopril-hydrochlorothiazide (PRINZIDE,ZESTORETIC) 20-12.5 MG per tablet Take 1 tablet by mouth daily.    [provider]  Misc Natural Products (OSTEO BI-FLEX TRIPLE STRENGTH) TABS Take 1 tablet by mouth 2 (two) times daily.    [provider]  Multiple Vitamins-Minerals (CENTRUM PO) Take 1 tablet by mouth daily.    [provider]  omeprazole (PRILOSEC) 20 MG capsule Take 20 mg by mouth at bedtime.    [provider]  phenazopyridine (PYRIDIUM) 200 MG tablet Take 1 tablet (200 mg total) by mouth 3 (three) times daily as needed for pain. 12/15/18   Joni Reining, PA-C  potassium chloride (MICRO-K) 10 MEQ CR capsule Take 10 mEq by mouth daily.    [provider]  Probiotic Product (ALIGN) 4 MG CAPS Take 1 capsule by mouth daily.    [provider]  Propylene Glycol (SYSTANE BALANCE) 0.6 % SOLN Apply  1 drop to eye 2 (two) times daily.    [provider]  ranitidine (ZANTAC) 150 MG tablet Take 150 mg by mouth daily.    [provider]  zolpidem (AMBIEN) 10 MG tablet Take 10 mg by mouth at bedtime as needed for sleep.    [provider]    Allergies Cefaclor, Clarithromycin, Meloxicam, Nitrofurantoin, Doxycycline, Keflex [cephalexin], Prednisone, Zolpidem, and Bactrim [sulfamethoxazole-trimethoprim]  No family history on file.  Social History Social History   Tobacco Use  . Smoking status: Former Smoker     Packs/day: 1.00    Types: Cigarettes    Quit date: 01/19/1999    Years since quitting: 20.7  . Smokeless tobacco: Never Used  Substance Use Topics  . Alcohol use: Yes    Comment: social  . Drug use: No    Review of Systems  Constitutional: Negative for fever. Eyes: Negative for visual changes. ENT: Negative for sore throat. Neck: No neck pain  Cardiovascular: Negative for chest pain. Respiratory: Negative for shortness of breath. Gastrointestinal: Negative for abdominal pain, vomiting or diarrhea. Genitourinary: Negative for dysuria. Musculoskeletal: Negative for back pain. Skin: + rash. Neurological: Negative for headaches, weakness or numbness. Psych: No SI or HI  ____________________________________________   PHYSICAL EXAM:  VITAL SIGNS: ED Triage Vitals  Enc Vitals Group     BP 10/14/19 0109 (!) 171/65     Pulse Rate 10/14/19 0109 80     Resp 10/14/19 0109 18     Temp 10/14/19 0109 97.9 F (36.6 C)     Temp Source 10/14/19 0109 Oral     SpO2 10/14/19 0109 99 %     Weight --      Height 10/14/19 0110 5\' 6"  (1.676 m)     Head Circumference --      Peak Flow --      Pain Score --      Pain Loc --      Pain Edu? --      Excl. in GC? --     Constitutional: Alert and oriented, constantly scratching her legs and arms.  HEENT:      Head: Normocephalic and atraumatic.         Eyes: Conjunctivae are normal. Sclera is non-icteric.       Mouth/Throat: Mucous membranes are moist.  No angioedema, no stridor, tongue and uvula are normal.      Neck: Supple with no signs of meningismus. Cardiovascular: Regular rate and rhythm.  Respiratory: Normal respiratory effort. Lungs are clear to auscultation bilaterally. No wheezes, crackles, or rhonchi.  Gastrointestinal: Soft, non tender Genitourinary: No CVA tenderness. Musculoskeletal: No edema, cyanosis, or erythema of extremities. Neurologic: Normal speech and language. Face is symmetric. Moving all extremities. No gross  focal neurologic deficits are appreciated. Skin: Skin is warm, dry and intact. No rash noted. Psychiatric: Mood and affect are normal. Speech and behavior are normal.  ____________________________________________   LABS (all labs ordered are listed, but only abnormal results are displayed)  Labs Reviewed  URINALYSIS, COMPLETE (UACMP) WITH MICROSCOPIC - Abnormal; Notable for the following components:      Result Value   APPearance CLOUDY (*)    Specific Gravity, Urine >1.030 (*)    Hgb urine dipstick TRACE (*)    Bilirubin Urine SMALL (*)    Ketones, ur TRACE (*)    Protein, ur 100 (*)    Leukocytes,Ua MODERATE (*)    Bacteria, UA FEW (*)    All other components within normal  limits  URINE CULTURE   ____________________________________________  EKG  none  ____________________________________________  RADIOLOGY  none  ____________________________________________   PROCEDURES  Procedure(s) performed: None Procedures Critical Care performed:  None ____________________________________________   INITIAL IMPRESSION / ASSESSMENT AND PLAN / ED COURSE   79 y.o. female with a history of chronic UTIs who presents for evaluation of an allergic reaction to Bactrim.  Patient with pruritus and rash which started after the second pill and has been getting worse.  No signs of anaphylaxis.  No obvious rash seen on exam but patient is scratching arms and legs nonstop.  Review of old medical records show that her cultures usually grow Enterobacter which is resistant to Macrobid and oral cephalosporins.  Patient also has an allergic reaction to cephalosporins, nitrofurantoin, doxycycline, and now Bactrim which limits the choice of antibiotics.  We will try 1 dose of 3 g of fosfomycin.  Culture was not sent on prior visit we will send a culture today.  The urinalysis seems to be clearing up.  There is no signs of sepsis, no abdominal pain or tenderness, no flank pain or tenderness.  Will  provide patient with a prescription for cetirizine for the rash.  Recommended follow-up with her PCP in 24 hours for recheck or return to the emergency room for fever, abdominal pain or flank pain.  Recommended daily cranberry supplementation to prevent UTIs.      _____________________________________________ Please note:  Patient was evaluated in Emergency Department today for the symptoms described in the history of present illness. Patient was evaluated in the context of the global COVID-19 pandemic, which necessitated consideration that the patient might be at risk for infection with the SARS-CoV-2 virus that causes COVID-19. Institutional protocols and algorithms that pertain to the evaluation of patients at risk for COVID-19 are in a state of rapid change based on information released by regulatory bodies including the CDC and federal and state organizations. These policies and algorithms were followed during the patient's care in the ED.  Some ED evaluations and interventions may be delayed as a result of limited staffing during the pandemic.   [place] Controlled Substance Database was reviewed by me. ____________________________________________   FINAL CLINICAL IMPRESSION(S) / ED DIAGNOSES   Final diagnoses:  Acute cystitis with hematuria      NEW MEDICATIONS STARTED DURING THIS VISIT:  ED Discharge Orders         Ordered    cetirizine (ZYRTEC ALLERGY) 10 MG tablet  Daily     10/14/19 8921           Note:  This document was prepared using Dragon voice recognition software and may include unintentional dictation errors.    Alfred Levins, [place], MD 10/14/19 [phone]

## 2019-10-14 NOTE — Discharge Instructions (Signed)
Take over-the-counter cranberry pills once a day to help prevent UTI. The antibiotic given to you here should take care of the infection. If your symptoms have not resolved in 24 hours or if you are having abdominal pain, worsening symptoms, fever, bilateral flank pain please return to the hospital.  Otherwise follow-up with your primary care doctor.  You can take cetirizine as prescribed for the rash.  Discontinue Bactrim.

## 2019-10-14 NOTE — ED Notes (Signed)
Patient states that she is unable to urinate at this time.

## 2019-10-14 NOTE — ED Notes (Signed)
Pt reports UTI treatment of ABX on Friday and has taken 4 total, pt now itching and reports new intolerance to "sulfameth", med added to allergy list

## 2019-10-14 NOTE — ED Triage Notes (Signed)
Patient was seen here Friday and diagnosed with a UTI. Patient states that she has taken 2 days of the antibiotics but states that he symptoms are worse. Patient also with complaint of rash and itching to bilateral arms and legs that started Friday.

## 2019-10-15 IMAGING — CT CT HEAD W/O CM
3 series · 15 of 45 positions shown, 18 images · non-contrast
Comparison: 03/21/2015

CLINICAL DATA: Chronic encephalopathy

EXAM:
CT HEAD WITHOUT CONTRAST
TECHNIQUE: Contiguous axial images were obtained from the base of the skull
through the vertex without intravenous contrast.

[Series 2: head wo · axial · 0.41mm/px · z∈[+586,+701]mm · 9 of 28 slices shown, 12 images]
[im 3/28  brain]
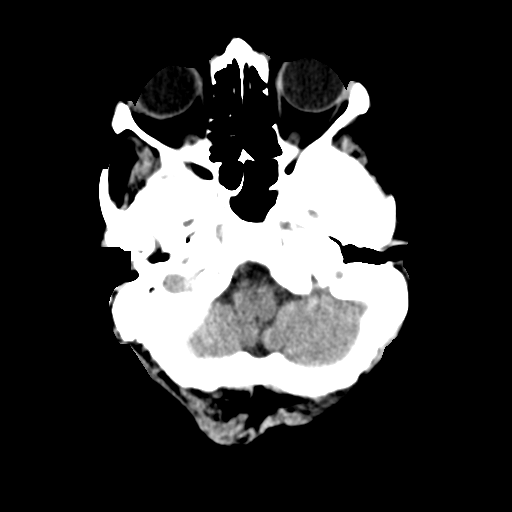
[im 3/28  bone]
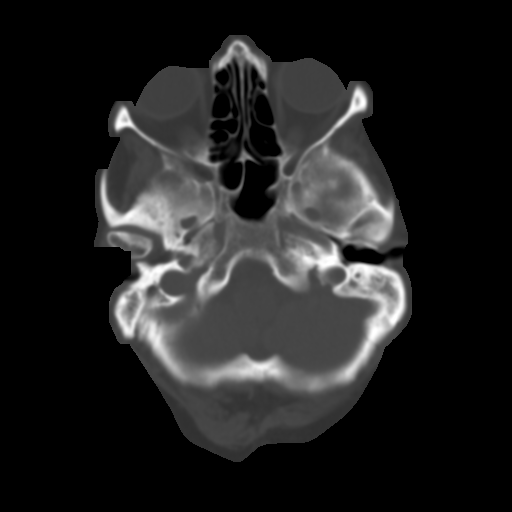
[im 6/28  brain]
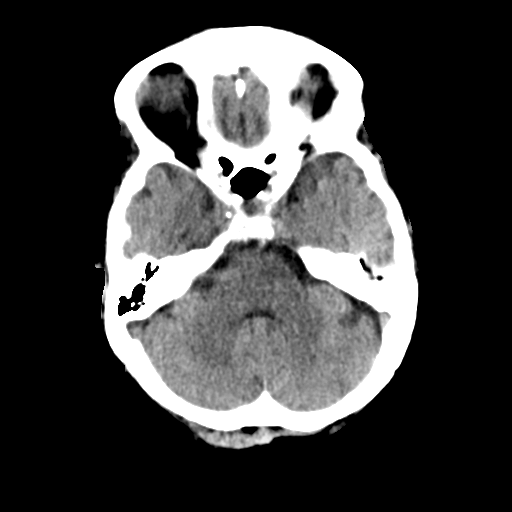
[im 9/28  brain]
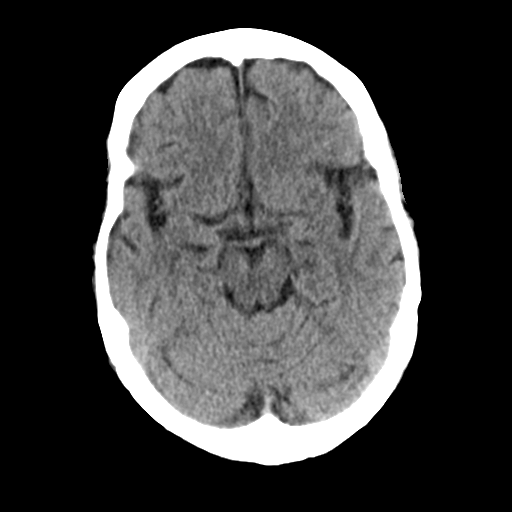
[im 12/28  brain]
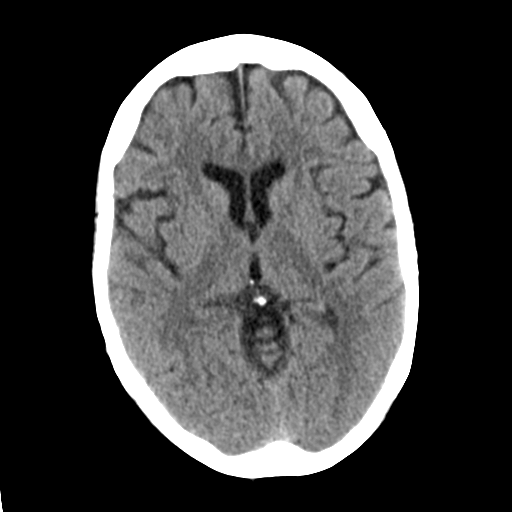
[im 15/28  brain]
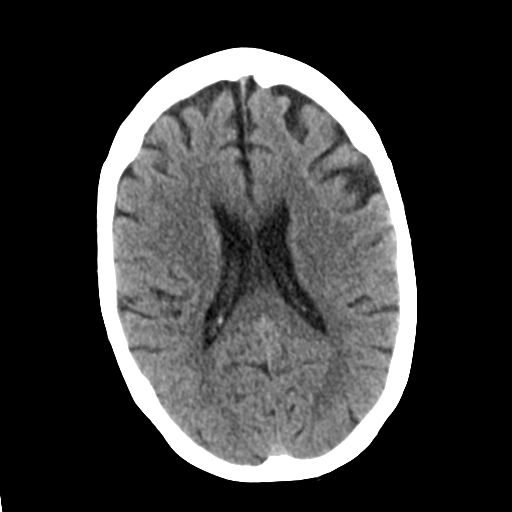
[im 15/28  bone]
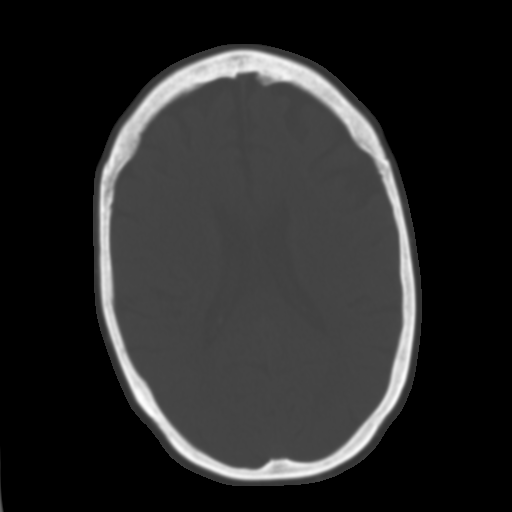
[im 17/28  brain]
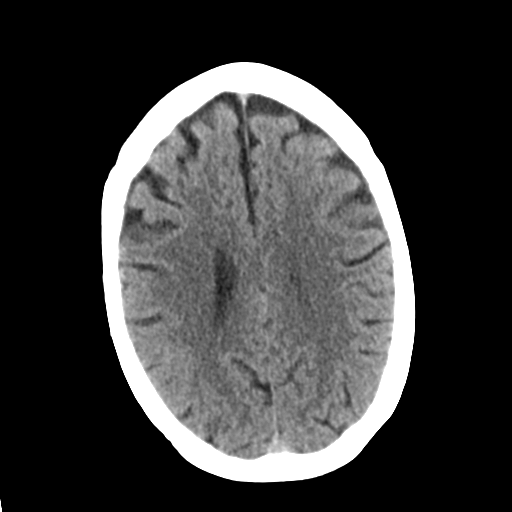
[im 20/28  brain]
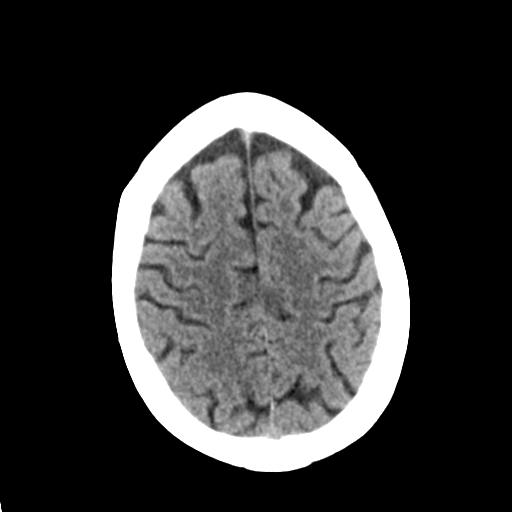
[im 23/28  brain]
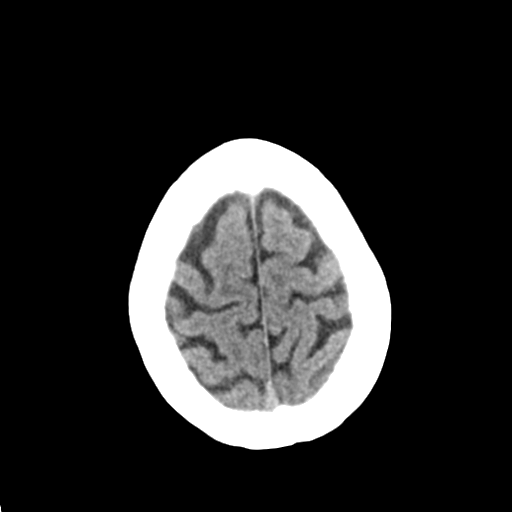
[im 26/28  brain]
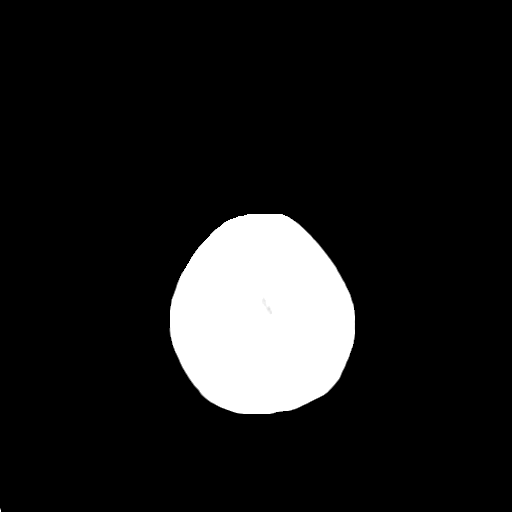
[im 26/28  bone]
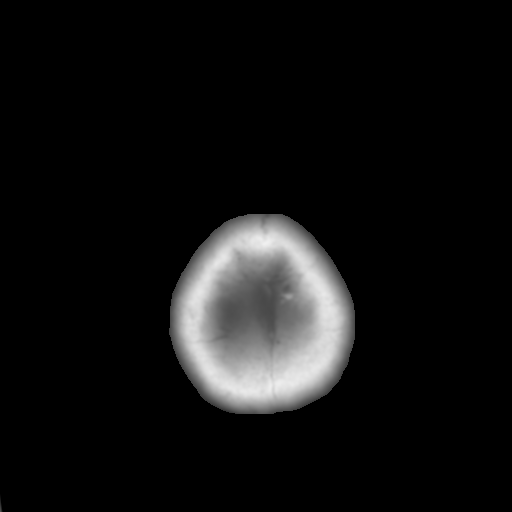

[Series 4: coronal soft tissue · coronal · 0.29mm/px · 3 of 62 slices shown]
[im 21/62  brain]
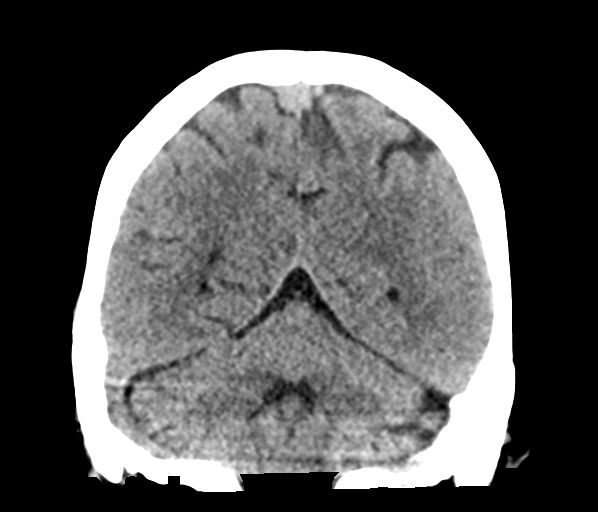
[im 28/62  brain]
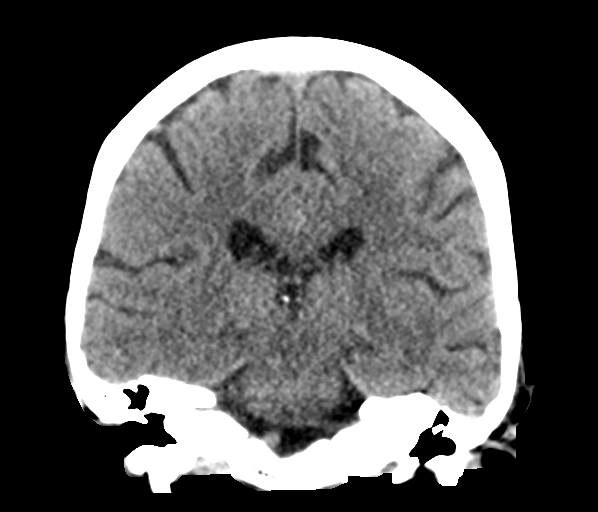
[im 34/62  brain]
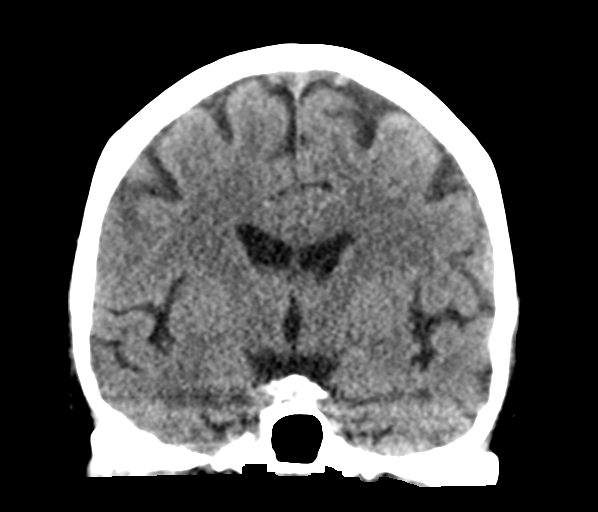

[Series 5: sagittal soft tissue · sagittal · 0.30mm/px · 3 of 45 slices shown]
[im 15/45  brain]
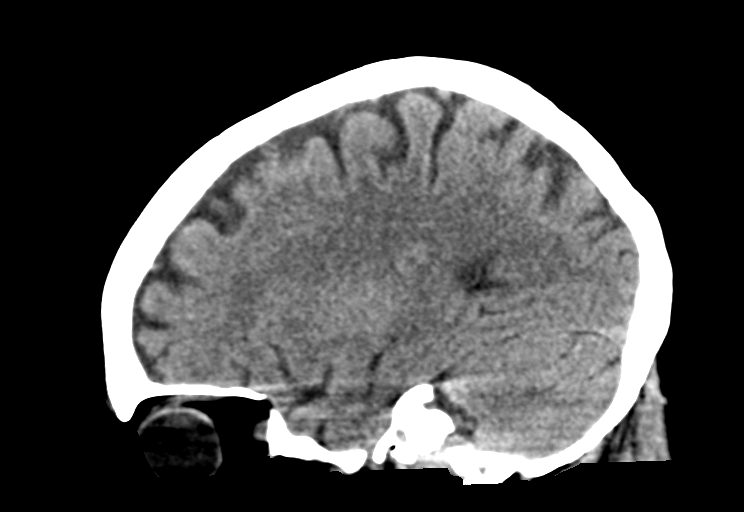
[im 23/45  brain]
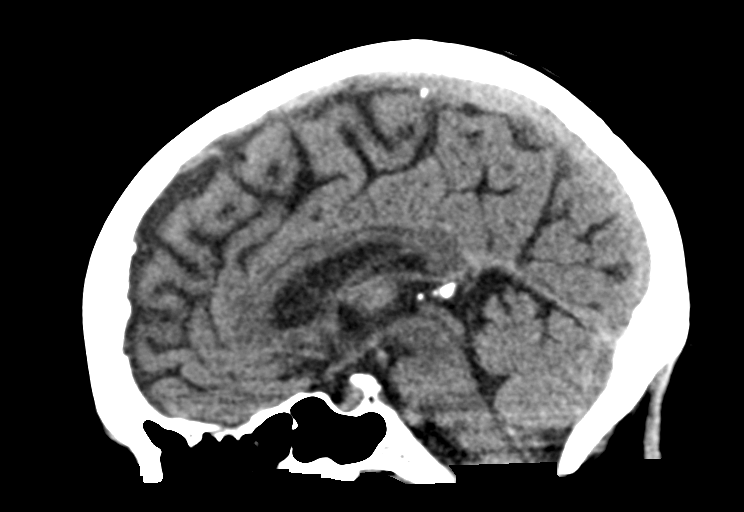
[im 30/45  brain]
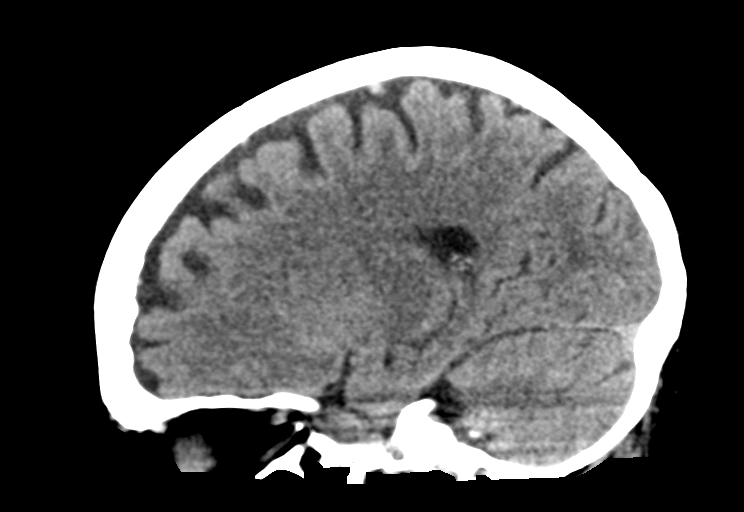

[15 of 45 positions shown; findings below may reference images not displayed]

FINDINGS: Brain: Mild atrophic changes are noted. These are consistent with
the patient's given age. No findings to suggest acute hemorrhage,
acute infarction or space-occupying mass lesion are noted.

Vascular: No hyperdense vessel or unexpected calcification.

Skull: Normal. Negative for fracture or focal lesion.

Sinuses/Orbits: No acute finding.

Other: None.
IMPRESSION: Mild atrophic changes without acute abnormality.

These results will be called to the ordering clinician or
representative by the Radiologist Assistant, and communication
documented in the PACS or zVision Dashboard.

## 2019-10-16 LAB — URINE CULTURE: Culture: >=100000 — AB

## 2020-03-12 ENCOUNTER — Encounter: Payer: Self-pay | Admitting: *Deleted

## 2020-03-12 ENCOUNTER — Emergency Department
Admission: EM | Admit: 2020-03-12 | Discharge: 2020-03-12 | Disposition: A | Discharge status: Home / Self Care | Payer: Medicare Other | Attending: Emergency Medicine | Admitting: Emergency Medicine

## 2020-03-12 ENCOUNTER — Other Ambulatory Visit: Payer: Self-pay

## 2020-03-12 DIAGNOSIS — S81811A Laceration without foreign body, right lower leg, initial encounter: Secondary | ICD-10-CM | POA: Insufficient documentation

## 2020-03-12 DIAGNOSIS — I129 Hypertensive chronic kidney disease with stage 1 through stage 4 chronic kidney disease, or unspecified chronic kidney disease: Secondary | ICD-10-CM | POA: Diagnosis not present

## 2020-03-12 DIAGNOSIS — W108XXA Fall (on) (from) other stairs and steps, initial encounter: Secondary | ICD-10-CM | POA: Diagnosis not present

## 2020-03-12 DIAGNOSIS — Z87891 Personal history of nicotine dependence: Secondary | ICD-10-CM | POA: Diagnosis not present

## 2020-03-12 DIAGNOSIS — N189 Chronic kidney disease, unspecified: Secondary | ICD-10-CM | POA: Diagnosis not present

## 2020-03-12 DIAGNOSIS — Z96651 Presence of right artificial knee joint: Secondary | ICD-10-CM | POA: Diagnosis not present

## 2020-03-12 DIAGNOSIS — Y9389 Activity, other specified: Secondary | ICD-10-CM | POA: Diagnosis not present

## 2020-03-12 DIAGNOSIS — Y92017 Garden or yard in single-family (private) house as the place of occurrence of the external cause: Secondary | ICD-10-CM | POA: Diagnosis not present

## 2020-03-12 DIAGNOSIS — Z79899 Other long term (current) drug therapy: Secondary | ICD-10-CM | POA: Insufficient documentation

## 2020-03-12 DIAGNOSIS — Z7982 Long term (current) use of aspirin: Secondary | ICD-10-CM | POA: Diagnosis not present

## 2020-03-12 NOTE — ED Triage Notes (Signed)
Pt was working in the yard, stepped up on the cement stoop and scraped right lower leg and left forearm.  Pt did not fall.  Pt alert  Bleeding controlled.

## 2020-03-12 NOTE — ED Provider Notes (Signed)
[facility] Emergency Department Provider Note  ____________________________________________  Time seen: Approximately 7:42 PM  I have reviewed the triage vital signs and the nursing notes.   HISTORY  Chief Complaint Abrasion and Leg Pain    HPI [name] is a 79 y.o. female who presents the emergency department complaining of injury to the right anterior shin.  Patient states that she tripped going up some stairs, made contact with the edge of the breast.  Patient states that she had a small skin tear.  When she arrived she states that the edges were well approximated but the superior aspect of the skin tear has separated since she has been here.  No active bleeding.  Patient is still ambulatory on both extremities.  Patient's last tetanus shot was in May 2019.  No other injury or complaint at this time.         Past Medical History:  Diagnosis Date  . Arthritis   . Atrial fibrillation (HCC)   . Chronic kidney disease    UTI  . GERD (gastroesophageal reflux disease)   . Hypertension     Patient Active Problem List   Diagnosis Date Noted  . 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 BREAST ENHANCEMENT SURGERY    . CHOLECYSTECTOMY    . EYE SURGERY Bilateral    Cataract Extraction  . PARTIAL KNEE ARTHROPLASTY Right 02/24/2015   Procedure: UNICOMPARTMENTAL KNEE;  Surgeon: Christena Flake, MD;  Location: [facility] ORS;  Service: Orthopedics;  Laterality: Right;  . TONSILLECTOMY      Prior to Admission medications   Medication Sig Start Date End Date Taking? Authorizing Provider  aspirin (BAYER ASPIRIN EC LOW DOSE) 81 MG EC tablet Take 81 mg by mouth daily. Swallow whole.    [provider]  Biotin 5000 MCG CAPS Take 1 capsule by mouth daily.    [provider]  bisacodyl (DULCOLAX) 5 MG EC tablet Take 5 mg by mouth daily.    [provider]  Calcium  Carb-Cholecalciferol (CALTRATE 600+D) 600-800 MG-UNIT TABS Take 1 tablet by mouth daily.    [provider]  cetirizine (ZYRTEC ALLERGY) 10 MG tablet Take 1 tablet (10 mg total) by mouth daily for 7 days. 10/14/19 10/21/19  Nita Sickle, MD  Cranberry 400 MG CAPS Take 1 capsule by mouth daily.    [provider]  estradiol (ESTRACE) 0.1 MG/GM vaginal cream Place 1 Applicatorful vaginally daily.    [provider]  fluticasone (FLONASE) 50 MCG/ACT nasal spray Place 1 spray into both nostrils daily.    [provider]  lisinopril-hydrochlorothiazide (PRINZIDE,ZESTORETIC) 20-12.5 MG per tablet Take 1 tablet by mouth daily.    [provider]  Misc Natural Products (OSTEO BI-FLEX TRIPLE STRENGTH) TABS Take 1 tablet by mouth 2 (two) times daily.    [provider]  Multiple Vitamins-Minerals (CENTRUM PO) Take 1 tablet by mouth daily.    [provider]  omeprazole (PRILOSEC) 20 MG capsule Take 20 mg by mouth at bedtime.    [provider]  phenazopyridine (PYRIDIUM) 200 MG tablet Take 1 tablet (200 mg total) by mouth 3 (three) times daily as needed for pain. 12/15/18   Joni Reining, PA-C  potassium chloride (MICRO-K) 10 MEQ CR capsule Take 10 mEq by mouth daily.    [provider]  Probiotic Product (ALIGN) 4 MG CAPS Take 1 capsule by mouth daily.  [provider]  Propylene Glycol (SYSTANE BALANCE) 0.6 % SOLN Apply 1 drop to eye 2 (two) times daily.    [provider]  ranitidine (ZANTAC) 150 MG tablet Take 150 mg by mouth daily.    [provider]  zolpidem (AMBIEN) 10 MG tablet Take 10 mg by mouth at bedtime as needed for sleep.    [provider]    Allergies Cefaclor, Clarithromycin, Meloxicam, Nitrofurantoin, Doxycycline, Keflex [cephalexin], Prednisone, Zolpidem, and Bactrim [sulfamethoxazole-trimethoprim]  No family history on file.  Social History Social History    Tobacco Use  . Smoking status: Former Smoker    Packs/day: 1.00    Types: Cigarettes    Quit date: 01/19/1999    Years since quitting: 21.1  . Smokeless tobacco: Never Used  Vaping Use  . Vaping Use: Never used  Substance Use Topics  . Alcohol use: Yes    Comment: social  . Drug use: No     Review of Systems  Constitutional: No fever/chills Eyes: No visual changes. No discharge ENT: No upper respiratory complaints. Cardiovascular: no chest pain. Respiratory: no cough. No SOB. Gastrointestinal: No abdominal pain.  No nausea, no vomiting.  No diarrhea.  No constipation. Musculoskeletal: Negative for musculoskeletal pain. Skin: Positive for soft tissue injury to the right anterior shin Neurological: Negative for headaches, focal weakness or numbness. 10-point ROS otherwise negative.  ____________________________________________   PHYSICAL EXAM:  VITAL SIGNS: ED Triage Vitals  Enc Vitals Group     BP 03/12/20 1744 (!) 158/77     Pulse Rate 03/12/20 1744 66     Resp 03/12/20 1744 20     Temp 03/12/20 1744 99 F (37.2 C)     Temp Source 03/12/20 1744 Oral     SpO2 03/12/20 1744 95 %     Weight 03/12/20 1745 16 lb (7.258 kg)     Height 03/12/20 1745 5\' 6"  (1.676 m)     Head Circumference --      Peak Flow --      Pain Score 03/12/20 1745 5     Pain Loc --      Pain Edu? --      Excl. in GC? --      Constitutional: Alert and oriented. Well appearing and in no acute distress. Eyes: Conjunctivae are normal. PERRL. EOMI. Head: Atraumatic. ENT:      Ears:       Nose: No congestion/rhinnorhea.      Mouth/Throat: Mucous membranes are moist.  Neck: No stridor.    Cardiovascular: Normal rate, regular rhythm. Normal S1 and S2.  Good peripheral circulation. Respiratory: Normal respiratory effort without tachypnea or retractions. Lungs CTAB. Good air entry to the bases with no decreased or absent breath sounds. Musculoskeletal: Full range of motion to all extremities.  No gross deformities appreciated. Neurologic:  Normal speech and language. No gross focal neurologic deficits are appreciated.  Skin:  Skin is warm, dry and intact. No rash noted.  Patient with a skin tear noted to the right anterior shin.  Edge of skin tear measures approximately 8 cm in length.  Skin tear does show signs of separating at this time.  No active bleeding.  No visible foreign body.  Examination of the knee and ankle is unremarkable. Psychiatric: Mood and affect are normal. Speech and behavior are normal. Patient exhibits appropriate insight and judgement.   ____________________________________________   LABS (all labs ordered are listed, but only abnormal results are displayed)  Labs Reviewed - No  data to display ____________________________________________  EKG   ____________________________________________  RADIOLOGY   No results found.  ____________________________________________    PROCEDURES  Procedure(s) performed:    Marland KitchenMarland KitchenLaceration Repair  Date/Time: 03/12/2020 8:07 PM Performed by: Racheal Patches, PA-C Authorized by: Racheal Patches, PA-C   Consent:    Consent obtained:  Verbal   Consent given by:  Patient   Risks discussed:  Pain, poor cosmetic result, poor wound healing and need for additional repair Anesthesia (see MAR for exact dosages):    Anesthesia method:  None Laceration details:    Location:  Leg   Leg location:  R lower leg   Length (cm):  8 Repair type:    Repair type:  Simple Exploration:    Hemostasis achieved with:  Direct pressure   Wound exploration: wound explored through full range of motion and entire depth of wound probed and visualized     Wound extent: no foreign bodies/material noted, no muscle damage noted, no nerve damage noted, no tendon damage noted, no underlying fracture noted and no vascular damage noted     Contaminated: no   Treatment:    Area cleansed with:  Shur-Clens   Amount of cleaning:   Standard Skin repair:    Repair method:  Steri-Strips (Derma-Clip)   Number of Steri-Strips:  3 Approximation:    Approximation:  Close Post-procedure details:    Dressing:  Non-adherent dressing   Patient tolerance of procedure:  Tolerated well, no immediate complications      Medications - No data to display   ____________________________________________   INITIAL IMPRESSION / ASSESSMENT AND PLAN / ED COURSE  Pertinent labs & imaging results that were available during my care of the patient were reviewed by me and considered in my medical decision making (see chart for details).  Review of the [place] CSRS was performed in accordance of the [facility] prior to dispensing any controlled drugs.           Patient's diagnosis is consistent with skin tear of the right lower leg.  Patient presented to emergency department complaining of injury to the right lower leg.  Patient tripped, caught her leg on the edge of the brick stair.  Patient sustained a skin tear.  Area was thoroughly cleansed, closed using derma clips.  Is unfortunately allergic to multiple antibiotics such as doxycycline, Bactrim, cephalosporins.  At this time however I do not feel that patient needs antibiotic coverage prophylactically.  Patient is very concerned for appropriate healing of the wound and I have recommended that she follow-up with her primary care to ensure proper healing..  Wound care instructions discussed with the patient at length.  Follow-up primary care.  Patient is given ED precautions to return to the ED for any worsening or new symptoms.     ____________________________________________  FINAL CLINICAL IMPRESSION(S) / ED DIAGNOSES  Final diagnoses:  Skin tear of right lower leg without complication, initial encounter      NEW MEDICATIONS STARTED DURING THIS VISIT:  ED Discharge Orders    None          This chart was dictated using voice recognition software/Dragon. Despite best  efforts to proofread, errors can occur which can change the meaning. Any change was purely unintentional.    Lanette Hampshire 03/12/20 2010    Phineas Semen, MD 03/12/20 2016

## 2020-03-14 ENCOUNTER — Emergency Department
Admission: EM | Admit: 2020-03-14 | Discharge: 2020-03-14 | Disposition: A | Discharge status: Home / Self Care | Payer: Medicare Other | Attending: Emergency Medicine | Admitting: Emergency Medicine

## 2020-03-14 ENCOUNTER — Other Ambulatory Visit: Payer: Self-pay

## 2020-03-14 ENCOUNTER — Encounter: Payer: Self-pay | Admitting: Emergency Medicine

## 2020-03-14 DIAGNOSIS — I129 Hypertensive chronic kidney disease with stage 1 through stage 4 chronic kidney disease, or unspecified chronic kidney disease: Secondary | ICD-10-CM | POA: Insufficient documentation

## 2020-03-14 DIAGNOSIS — Z7982 Long term (current) use of aspirin: Secondary | ICD-10-CM | POA: Insufficient documentation

## 2020-03-14 DIAGNOSIS — Z5189 Encounter for other specified aftercare: Secondary | ICD-10-CM

## 2020-03-14 DIAGNOSIS — Z4800 Encounter for change or removal of nonsurgical wound dressing: Secondary | ICD-10-CM | POA: Diagnosis not present

## 2020-03-14 DIAGNOSIS — Z79899 Other long term (current) drug therapy: Secondary | ICD-10-CM | POA: Insufficient documentation

## 2020-03-14 DIAGNOSIS — Z87891 Personal history of nicotine dependence: Secondary | ICD-10-CM | POA: Diagnosis not present

## 2020-03-14 DIAGNOSIS — N189 Chronic kidney disease, unspecified: Secondary | ICD-10-CM | POA: Insufficient documentation

## 2020-03-14 DIAGNOSIS — Z96651 Presence of right artificial knee joint: Secondary | ICD-10-CM | POA: Diagnosis not present

## 2020-03-14 NOTE — ED Triage Notes (Signed)
Pt to ED via POV, pt states that she was seen on Thursday for wound on her right leg, pt states that she is starting to have some bleeding through her bandage and wants to have the area rechecked and redressed. Pt is in NAD at this time. No other complaints.

## 2020-03-14 NOTE — Discharge Instructions (Addendum)
These keep your follow-up with Dr. Hyacinth Meeker on Monday.

## 2020-03-14 NOTE — ED Notes (Signed)
Pt to the er for bleeding from a wound that she was treated for On Thursday. Pt has a skin tear to the right shin with steri strips applied. No active bleeding noted. Pt has an appt with Hyacinth Meeker on Monday. Pt just needs a dressing change.

## 2020-03-14 NOTE — ED Provider Notes (Signed)
[facility] Emergency Department Provider Note  ____________________________________________   First MD Initiated Contact with Patient 03/14/20 1402     (approximate)  I have reviewed the triage vital signs and the nursing notes.   HISTORY  Chief Complaint Wound Check  HPI [name] is a 79 y.o. female who reports to the emergency room for repeat evaluation.  She was last seen a few days ago and diagnosed with skin tear of the right lower extremity.  She states that she noticed that it was bleeding through her bandage and became concerned that something was wrong with it.  She is not having any increased pain.  Denies fevers or other systemic symptoms.  She would just like checked out         Past Medical History:  Diagnosis Date  . Arthritis   . Atrial fibrillation (HCC)   . Chronic kidney disease    UTI  . GERD (gastroesophageal reflux disease)   . Hypertension     Patient Active Problem List   Diagnosis Date Noted  . 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 BREAST ENHANCEMENT SURGERY    . CHOLECYSTECTOMY    . EYE SURGERY Bilateral    Cataract Extraction  . PARTIAL KNEE ARTHROPLASTY Right 02/24/2015   Procedure: UNICOMPARTMENTAL KNEE;  Surgeon: Christena Flake, MD;  Location: [facility] ORS;  Service: Orthopedics;  Laterality: Right;  . TONSILLECTOMY      Prior to Admission medications   Medication Sig Start Date End Date Taking? Authorizing Provider  aspirin (BAYER ASPIRIN EC LOW DOSE) 81 MG EC tablet Take 81 mg by mouth daily. Swallow whole.    [provider]  Biotin 5000 MCG CAPS Take 1 capsule by mouth daily.    [provider]  bisacodyl (DULCOLAX) 5 MG EC tablet Take 5 mg by mouth daily.    [provider]  Calcium Carb-Cholecalciferol (CALTRATE 600+D) 600-800 MG-UNIT TABS Take 1 tablet by mouth daily.    [provider]    cetirizine (ZYRTEC ALLERGY) 10 MG tablet Take 1 tablet (10 mg total) by mouth daily for 7 days. 10/14/19 10/21/19  Nita Sickle, MD  Cranberry 400 MG CAPS Take 1 capsule by mouth daily.    [provider]  estradiol (ESTRACE) 0.1 MG/GM vaginal cream Place 1 Applicatorful vaginally daily.    [provider]  fluticasone (FLONASE) 50 MCG/ACT nasal spray Place 1 spray into both nostrils daily.    [provider]  lisinopril-hydrochlorothiazide (PRINZIDE,ZESTORETIC) 20-12.5 MG per tablet Take 1 tablet by mouth daily.    [provider]  Misc Natural Products (OSTEO BI-FLEX TRIPLE STRENGTH) TABS Take 1 tablet by mouth 2 (two) times daily.    [provider]  Multiple Vitamins-Minerals (CENTRUM PO) Take 1 tablet by mouth daily.    [provider]  omeprazole (PRILOSEC) 20 MG capsule Take 20 mg by mouth at bedtime.    [provider]  phenazopyridine (PYRIDIUM) 200 MG tablet Take 1 tablet (200 mg total) by mouth 3 (three) times daily as needed for pain. 12/15/18   Joni Reining, PA-C  potassium chloride (MICRO-K) 10 MEQ CR capsule Take 10 mEq by mouth daily.    [provider]  Probiotic Product (ALIGN) 4 MG CAPS Take 1 capsule by mouth daily.    [provider]  Propylene Glycol (SYSTANE BALANCE) 0.6 % SOLN Apply 1 drop to eye 2 (  two) times daily.    [provider]  ranitidine (ZANTAC) 150 MG tablet Take 150 mg by mouth daily.    [provider]  zolpidem (AMBIEN) 10 MG tablet Take 10 mg by mouth at bedtime as needed for sleep.    [provider]    Allergies Cefaclor, Clarithromycin, Meloxicam, Nitrofurantoin, Doxycycline, Keflex [cephalexin], Prednisone, Zolpidem, and Bactrim [sulfamethoxazole-trimethoprim]  No family history on file.  Social History Social History   Tobacco Use  . Smoking status: Former Smoker    Packs/day: 1.00    Types: Cigarettes    Quit date: 01/19/1999     Years since quitting: 21.1  . Smokeless tobacco: Never Used  Vaping Use  . Vaping Use: Never used  Substance Use Topics  . Alcohol use: Yes    Comment: social  . Drug use: No    Review of Systems Constitutional: No fever/chills Eyes: No visual changes. ENT: No sore throat. Cardiovascular: Denies chest pain. Respiratory: Denies shortness of breath. Gastrointestinal: No abdominal pain.  No nausea, no vomiting.  No diarrhea.  No constipation. Genitourinary: Negative for dysuria. Musculoskeletal: Negative for back pain. Skin:+ Skin tear right lower extremity Neurological: Negative for headaches, focal weakness or numbness.   ____________________________________________   PHYSICAL EXAM:  VITAL SIGNS: ED Triage Vitals  Enc Vitals Group     BP 03/14/20 1312 (!) 144/68     Pulse Rate 03/14/20 1312 (!) 58     Resp 03/14/20 1312 16     Temp 03/14/20 1312 98.2 F (36.8 C)     Temp Source 03/14/20 1312 Oral     SpO2 03/14/20 1312 95 %     Weight --      Height 03/14/20 1313 5\' 6"  (1.676 m)     Head Circumference --      Peak Flow --      Pain Score 03/14/20 1313 3     Pain Loc --      Pain Edu? --      Excl. in GC? --    Constitutional: Alert and oriented. Well appearing and in no acute distress. Eyes: Conjunctivae are normal. PERRL. EOMI. Head: Atraumatic. Cardiovascular: Normal rate, regular rhythm. Grossly normal heart sounds.  Good peripheral circulation. Respiratory: Normal respiratory effort.  No retractions. Lungs CTAB. Musculoskeletal: No lower extremity tenderness nor edema.  No joint effusions. Neurologic:  Normal speech and language. No gross focal neurologic deficits are appreciated. No gait instability. Skin: There is a skin tear of the right lower extremity that has 3 derma clips in place.  There is dried blood along the area without any current oozing.  There is no surrounding erythema or induration. Psychiatric: Mood and affect are normal. Speech and  behavior are normal.   ____________________________________________   INITIAL IMPRESSION / ASSESSMENT AND PLAN / ED COURSE  As part of my medical decision making, I reviewed the following data within the electronic MEDICAL RECORD NUMBER Nursing notes reviewed and incorporated and Notes from prior ED visits        [name] is a 79 year old female who presents to the emergency department for recheck of her right lower extremity wound.  Physical exam demonstrates 3 dermal clips that are approximating the wound well with minimal drainage and no surrounding erythema or concern for infection.  Recommended that we place additional gauze on top for any mild weeping that may occur as well as Cob[place].  The patient was instructed to return if she notices any surrounding redness around the  site or fevers or any other signs of infection.  The patient is amenable with this plan.      ____________________________________________   FINAL CLINICAL IMPRESSION(S) / ED DIAGNOSES  Final diagnoses:  Visit for wound check     ED Discharge Orders    None      *Please note:  [name] was evaluated in Emergency Department on 03/14/2020 for the symptoms described in the history of present illness. She was evaluated in the context of the global COVID-19 pandemic, which necessitated consideration that the patient might be at risk for infection with the SARS-CoV-2 virus that causes COVID-19. Institutional protocols and algorithms that pertain to the evaluation of patients at risk for COVID-19 are in a state of rapid change based on information released by regulatory bodies including the CDC and federal and state organizations. These policies and algorithms were followed during the patient's care in the ED.  Some ED evaluations and interventions may be delayed as a result of limited staffing during and the pandemic.*   Note:  This document was prepared using Dragon voice recognition software and may  include unintentional dictation errors.    Lucy Chris, PA 03/14/20 1909    Merwyn Katos, MD 03/15/20 [phone]

## 2020-05-15 ENCOUNTER — Other Ambulatory Visit: Payer: Self-pay

## 2020-05-15 ENCOUNTER — Emergency Department: Payer: Medicare Other

## 2020-05-15 ENCOUNTER — Emergency Department
Admission: EM | Admit: 2020-05-15 | Discharge: 2020-05-15 | Disposition: A | Discharge status: Home / Self Care | Payer: Medicare Other | Attending: Emergency Medicine | Admitting: Emergency Medicine

## 2020-05-15 ENCOUNTER — Encounter: Payer: Self-pay | Admitting: Emergency Medicine

## 2020-05-15 DIAGNOSIS — R319 Hematuria, unspecified: Secondary | ICD-10-CM

## 2020-05-15 DIAGNOSIS — Z87891 Personal history of nicotine dependence: Secondary | ICD-10-CM | POA: Diagnosis not present

## 2020-05-15 DIAGNOSIS — Z7982 Long term (current) use of aspirin: Secondary | ICD-10-CM | POA: Diagnosis not present

## 2020-05-15 DIAGNOSIS — R1030 Lower abdominal pain, unspecified: Secondary | ICD-10-CM | POA: Diagnosis present

## 2020-05-15 DIAGNOSIS — I129 Hypertensive chronic kidney disease with stage 1 through stage 4 chronic kidney disease, or unspecified chronic kidney disease: Secondary | ICD-10-CM | POA: Diagnosis not present

## 2020-05-15 DIAGNOSIS — N3001 Acute cystitis with hematuria: Secondary | ICD-10-CM | POA: Diagnosis not present

## 2020-05-15 DIAGNOSIS — N189 Chronic kidney disease, unspecified: Secondary | ICD-10-CM | POA: Insufficient documentation

## 2020-05-15 DIAGNOSIS — N390 Urinary tract infection, site not specified: Secondary | ICD-10-CM

## 2020-05-15 DIAGNOSIS — Z79899 Other long term (current) drug therapy: Secondary | ICD-10-CM | POA: Diagnosis not present

## 2020-05-15 LAB — CBC
HCT: 38.9 % (ref 36.0–46.0)
Hemoglobin: 12.8 g/dL (ref 12.0–15.0)
MCH: 27.9 pg (ref 26.0–34.0)
MCHC: 32.9 g/dL (ref 30.0–36.0)
MCV: 84.7 fL (ref 80.0–100.0)
Platelets: 209 10*3/uL (ref 150–400)
RBC: 4.59 MIL/uL (ref 3.87–5.11)
RDW: 12.8 % (ref 11.5–15.5)
WBC: 9.6 10*3/uL (ref 4.0–10.5)
nRBC: 0 % (ref 0.0–0.2)

## 2020-05-15 LAB — URINALYSIS, COMPLETE (UACMP) WITH MICROSCOPIC
Bacteria, UA: NONE SEEN
RBC / HPF: >50 RBC/hpf — ABNORMAL HIGH (ref 0–5)
Specific Gravity, Urine: 1.019 (ref 1.005–1.030)
WBC, UA: >50 WBC/hpf — ABNORMAL HIGH (ref 0–5)

## 2020-05-15 LAB — BASIC METABOLIC PANEL
Anion gap: 10 (ref 5–15)
BUN: 20 mg/dL (ref 8–23)
CO2: 25 mmol/L (ref 22–32)
Calcium: 9.9 mg/dL (ref 8.9–10.3)
Chloride: 106 mmol/L (ref 98–111)
Creatinine, Ser: 1.04 mg/dL — ABNORMAL HIGH (ref 0.44–1.00)
GFR, Estimated: 55 mL/min — ABNORMAL LOW (ref >60)
Glucose, Bld: 105 mg/dL — ABNORMAL HIGH (ref 70–99)
Potassium: 4.3 mmol/L (ref 3.5–5.1)
Sodium: 141 mmol/L (ref 135–145)

## 2020-05-15 MED ORDER — CIPROFLOXACIN HCL 500 MG PO TABS
500.0000 mg | ORAL_TABLET | Freq: Two times a day (BID) | ORAL | 0 refills | Status: AC
Start: 2020-05-15 — End: 2020-05-29

## 2020-05-15 NOTE — ED Provider Notes (Signed)
[facility] Emergency Department Provider Note  ____________________________________________   First MD Initiated Contact with Patient 05/15/20 0502     (approximate)  I have reviewed the triage vital signs and the nursing notes.   HISTORY  Chief Complaint Urinary Tract Infection    HPI [name] is a 79 y.o. female with medical history as listed below who presents for evaluation of acute onset and moderate severity pressure and pain in her lower middle abdomen (suprapubic region) with sharp pain that radiates to her lower back in the middle.  She also has some burning when she urinates and has had increased urinary frequency but with smaller amounts.  She explained to me several times that this happens whenever she urinates and defecates at the same time, and then shortly thereafter she develops a urinary tract infection.  She said that this happened while at her daughter's house a few hours before bed.  She woke up from sleep in the middle of the night and had to urinate a small volume and could tell by the way it felt  that she had an infection.  She wanted to come get it evaluated soon before she got more sick.  She denies fever/chills, sore throat, chest pain, shortness of breath, nausea, vomiting, and any other abdominal pain.  Nothing particular makes the symptoms better or worse.        Past Medical History:  Diagnosis Date  . Arthritis   . Atrial fibrillation (HCC)   . Chronic kidney disease    UTI  . GERD (gastroesophageal reflux disease)   . Hypertension     Patient Active Problem List   Diagnosis Date Noted  . 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 BREAST ENHANCEMENT SURGERY    . CHOLECYSTECTOMY    . EYE SURGERY Bilateral    Cataract Extraction  . PARTIAL KNEE ARTHROPLASTY Right 02/24/2015   Procedure: UNICOMPARTMENTAL KNEE;  Surgeon: Christena Flake, MD;   Location: [facility] ORS;  Service: Orthopedics;  Laterality: Right;  . TONSILLECTOMY      Prior to Admission medications   Medication Sig Start Date End Date Taking? Authorizing Provider  aspirin (BAYER ASPIRIN EC LOW DOSE) 81 MG EC tablet Take 81 mg by mouth daily. Swallow whole.    [provider]  Biotin 5000 MCG CAPS Take 1 capsule by mouth daily.    [provider]  bisacodyl (DULCOLAX) 5 MG EC tablet Take 5 mg by mouth daily.    [provider]  Calcium Carb-Cholecalciferol (CALTRATE 600+D) 600-800 MG-UNIT TABS Take 1 tablet by mouth daily.    [provider]  cetirizine (ZYRTEC ALLERGY) 10 MG tablet Take 1 tablet (10 mg total) by mouth daily for 7 days. 10/14/19 10/21/19  Nita Sickle, MD  ciprofloxacin (CIPRO) 500 MG tablet Take 1 tablet (500 mg total) by mouth 2 (two) times daily for 14 days. 05/15/20 05/29/20  Loleta Rose, MD  Cranberry 400 MG CAPS Take 1 capsule by mouth daily.    [provider]  estradiol (ESTRACE) 0.1 MG/GM vaginal cream Place 1 Applicatorful vaginally daily.    [provider]  fluticasone (FLONASE) 50 MCG/ACT nasal spray Place 1 spray into both nostrils daily.    [provider]  lisinopril-hydrochlorothiazide (PRINZIDE,ZESTORETIC) 20-12.5 MG per tablet Take 1 tablet by mouth daily.    [provider]  Misc Natural Products (OSTEO BI-FLEX TRIPLE STRENGTH) TABS Take  1 tablet by mouth 2 (two) times daily.    [provider]  Multiple Vitamins-Minerals (CENTRUM PO) Take 1 tablet by mouth daily.    [provider]  omeprazole (PRILOSEC) 20 MG capsule Take 20 mg by mouth at bedtime.    [provider]  phenazopyridine (PYRIDIUM) 200 MG tablet Take 1 tablet (200 mg total) by mouth 3 (three) times daily as needed for pain. 12/15/18   Joni Reining, PA-C  potassium chloride (MICRO-K) 10 MEQ CR capsule Take 10 mEq by mouth daily.    [provider]  Probiotic  Product (ALIGN) 4 MG CAPS Take 1 capsule by mouth daily.    [provider]  Propylene Glycol (SYSTANE BALANCE) 0.6 % SOLN Apply 1 drop to eye 2 (two) times daily.    [provider]  ranitidine (ZANTAC) 150 MG tablet Take 150 mg by mouth daily.    [provider]  zolpidem (AMBIEN) 10 MG tablet Take 10 mg by mouth at bedtime as needed for sleep.    [provider]    Allergies Cefaclor, Clarithromycin, Meloxicam, Nitrofurantoin, Doxycycline, Keflex [cephalexin], Prednisone, Zolpidem, and Bactrim [sulfamethoxazole-trimethoprim]  History reviewed. No pertinent family history.  Social History Social History   Tobacco Use  . Smoking status: Former Smoker    Packs/day: 1.00    Types: Cigarettes    Quit date: 01/19/1999    Years since quitting: 21.3  . Smokeless tobacco: Never Used  Vaping Use  . Vaping Use: Never used  Substance Use Topics  . Alcohol use: Yes    Comment: social  . Drug use: No    Review of Systems Constitutional: No fever/chills Eyes: No visual changes. ENT: No sore throat. Cardiovascular: Denies chest pain. Respiratory: Denies shortness of breath. Gastrointestinal: Mild suprapubic pain radiating to her lower back. Genitourinary: + Dysuria.  Increased urinary frequency and decreased volume. Musculoskeletal: Negative for neck pain.  Negative for back pain. Integumentary: Negative for rash. Neurological: Negative for headaches, focal weakness or numbness.   ____________________________________________   PHYSICAL EXAM:  VITAL SIGNS: ED Triage Vitals  Enc Vitals Group     BP 05/15/20 0445 (!) 180/62     Pulse Rate 05/15/20 0445 70     Resp 05/15/20 0445 16     Temp 05/15/20 0445 98.3 F (36.8 C)     Temp Source 05/15/20 0445 Oral     SpO2 05/15/20 0445 97 %     Weight 05/15/20 0445 72.6 kg (160 lb)     Height 05/15/20 0445 1.676 m (5\' 6" )     Head Circumference --      Peak Flow --      Pain Score 05/15/20 0446 7      Pain Loc --      Pain Edu? --      Excl. in GC? --     Constitutional: Alert and oriented.  Eyes: Conjunctivae are normal.  Head: Atraumatic. Nose: No congestion/rhinnorhea. Mouth/Throat: Patient is wearing a mask. Neck: No stridor.  No meningeal signs.   Cardiovascular: Normal rate, regular rhythm. Good peripheral circulation. Grossly normal heart sounds. Respiratory: Normal respiratory effort.  No retractions. Gastrointestinal: Soft and nondistended.  Mild tenderness to palpation of the suprapubic region. Musculoskeletal: Minimal tenderness to palpation of the right and left flank, I doubt true CVA tenderness, but patient does report some discomfort. Neurologic:  Normal speech and language. No gross focal neurologic deficits are appreciated.  Skin:  Skin is warm, dry and intact. Psychiatric:  Mood and affect are normal. Speech and behavior are normal.  ____________________________________________   LABS (all labs ordered are listed, but only abnormal results are displayed)  Labs Reviewed  URINALYSIS, COMPLETE (UACMP) WITH MICROSCOPIC - Abnormal; Notable for the following components:      Result Value   Color, Urine ORANGE (*)    APPearance CLOUDY (*)    Glucose, UA   (*)    Value: TEST NOT REPORTED DUE TO COLOR INTERFERENCE OF URINE PIGMENT   Hgb urine dipstick   (*)    Value: TEST NOT REPORTED DUE TO COLOR INTERFERENCE OF URINE PIGMENT   Bilirubin Urine   (*)    Value: TEST NOT REPORTED DUE TO COLOR INTERFERENCE OF URINE PIGMENT   Ketones, ur   (*)    Value: TEST NOT REPORTED DUE TO COLOR INTERFERENCE OF URINE PIGMENT   Protein, ur   (*)    Value: TEST NOT REPORTED DUE TO COLOR INTERFERENCE OF URINE PIGMENT   Nitrite   (*)    Value: TEST NOT REPORTED DUE TO COLOR INTERFERENCE OF URINE PIGMENT   Leukocytes,Ua   (*)    Value: TEST NOT REPORTED DUE TO COLOR INTERFERENCE OF URINE PIGMENT   RBC / HPF >50 (*)    WBC, UA >50 (*)    All other components within normal  limits  BASIC METABOLIC PANEL - Abnormal; Notable for the following components:   Glucose, Bld 105 (*)    Creatinine, Ser 1.04 (*)    GFR, Estimated 55 (*)    All other components within normal limits  URINE CULTURE  CBC   ____________________________________________  EKG  No indication for emergent EKG ____________________________________________  RADIOLOGY I, Loleta Rose, personally viewed and evaluated these images (plain radiographs) as part of my medical decision making, as well as reviewing the written report by the radiologist.  ED MD interpretation:  No acute abnormalities identified on CT abd/pelvis  Official radiology report(s): CT Renal Stone Study  Result Date: 05/15/2020 CLINICAL DATA:  Pelvic pain and pressure. Not able to urinate much with painful urination. Suspected hematuria. EXAM: CT ABDOMEN AND PELVIS WITHOUT CONTRAST TECHNIQUE: Multidetector CT imaging of the abdomen and pelvis was performed following the standard protocol without IV contrast. COMPARISON:  None. FINDINGS: Lower chest: The lung bases are clear. Partial visualization of probable bilateral breast implants. Hepatobiliary: No focal liver abnormality is seen. Status post cholecystectomy. No biliary dilatation. Pancreas: Unremarkable. No pancreatic ductal dilatation or surrounding inflammatory changes. Spleen: Normal in size without focal abnormality. Adrenals/Urinary Tract: Adrenal glands are unremarkable. Right renal cyst. Kidneys are otherwise normal, without renal calculi, focal solid lesion, or hydronephrosis. Bladder is unremarkable. Stomach/Bowel: Stomach is within normal limits. Appendix appears normal. No evidence of bowel wall thickening, distention, or inflammatory changes. Diverticulosis of the sigmoid colon without evidence of diverticulitis. Vascular/Lymphatic: Aortic atherosclerosis. No enlarged abdominal or pelvic lymph nodes. Reproductive: Uterus and bilateral adnexa are unremarkable. Other:  Small bilateral inguinal hernias containing fat. No free air or free fluid in the abdomen. Musculoskeletal: Degenerative changes in the spine. No destructive bone lesions. IMPRESSION: 1. No acute process demonstrated in the abdomen or pelvis. No renal or ureteral stone or obstruction. Diverticulosis of the sigmoid colon without evidence of diverticulitis. 2. Small bilateral inguinal hernias containing fat. 3. Aortic atherosclerosis. Aortic Atherosclerosis (ICD10-I70.0). Electronically Signed   By: Burman Nieves M.D.   On: 05/15/2020 06:22    ____________________________________________   PROCEDURES   Procedure(s) performed (including Critical Care):  Procedures  ____________________________________________   INITIAL IMPRESSION / MDM / ASSESSMENT AND PLAN / ED COURSE  As part of my medical decision making, I reviewed the following data within the electronic MEDICAL RECORD NUMBER Nursing notes reviewed and incorporated, Labs reviewed , Old chart reviewed and Notes from prior ED visits   Differential diagnosis includes, but is not limited to, UTI, pyelonephritis, renal colic, obstructive and/or infected ureteral stone, colovesicular fistula.  Vital signs are reassuring and within normal limits other than hypertension.  Basic metabolic panel is essentially normal, CBC is normal with no leukocytosis.  Urinalysis is pending but I saw the urine in the specimen cup and it is almost pinkish-orange color consistent with hematuria and cloudy.  She claims she is not currently taking any medication such as Pyridium although I reviewed her medical record and see she has taken Pyridium in the past.  Given her age and comorbidities, the acute onset, the nature of the pain and the radiation, and the fact that it would be very unusual for her to develop a severe urinary tract infection within hours of contaminated herself by urinating and defecating at the same time (which she strongly believes is the cause  of her symptoms), I will obtain a CT renal stone protocol of the abdomen and pelvis to evaluate for the possibility of an obstructive stone as well as to evaluate (understanding that there is no IV contrast) for additional abnormalities such as acute infection, chronic cystitis or interstitial cystitis, the possibility of a fistula, etc.       Clinical Course as of May 16 643  [facility]  0611 Extensive hematuria, difficult to interpret, but no bacteria seen.  Urine culture is pending.  CT renal stone protocol is also pending.  Urinalysis, Complete w Microscopic Urine, Clean Catch(!) [CF]  0636 No acute abnormalities identified on CT scan.  I looked back to the patient's prior urine cultures and she has had UTIs from E. coli, Enterococcus, and Enterobacter.  She has had a variety of resistance patterns on these different bacteria but it looks like all of the 3 have been, at various times, sensitive to ciprofloxacin.  Additionally, she has multiple antibiotic allergies listed and is adamant that she cannot take any of them, including cefaclor, doxycycline, Keflex, Bactrim, and nitrofurantoin.Under circumstances I think it would be reasonable to treat with ciprofloxacin even though this would not be my first line treatment in most cases.  I suggested this to her and she says she has taken it successfully in the past.  I will treat her with ciprofloxacin 500 mg by mouth twice daily for 14 days based on the appearance of the urine and the abnormal UA findings.  I encouraged her to follow-up with her primary care doctor.  She said that Dr. Hyacinth MeekerMiller has referred her to urology in the past and that they have not been able to identify any particular problem even after cystoscopy, so I think it is reasonable to follow-up with a PCP and set up with a specialist.  I gave strict return precautions and she understands and agrees with the plan.  CT Renal Soundra PilonStone Study [CF]    Clinical Course User Index [CF]  Loleta RoseForbach, Kaiser Belluomini, MD     ____________________________________________  FINAL CLINICAL IMPRESSION(S) / ED DIAGNOSES  Final diagnoses:  Urinary tract infection with hematuria, site unspecified     MEDICATIONS GIVEN DURING THIS VISIT:  Medications - No data to display   ED Discharge Orders  Ordered    ciprofloxacin (CIPRO) 500 MG tablet  2 times daily        05/15/20 0640          *Please note:  [name] was evaluated in Emergency Department on 05/15/2020 for the symptoms described in the history of present illness. She was evaluated in the context of the global COVID-19 pandemic, which necessitated consideration that the patient might be at risk for infection with the SARS-CoV-2 virus that causes COVID-19. Institutional protocols and algorithms that pertain to the evaluation of patients at risk for COVID-19 are in a state of rapid change based on information released by regulatory bodies including the CDC and federal and state organizations. These policies and algorithms were followed during the patient's care in the ED.  Some ED evaluations and interventions may be delayed as a result of limited staffing during and after the pandemic.*  Note:  This document was prepared using Dragon voice recognition software and may include unintentional dictation errors.   Loleta Rose, MD 05/15/20 [phone]

## 2020-05-15 NOTE — ED Triage Notes (Signed)
Pt to ED from home c/o pelvic pain, pressure with urination and not able to urinate much, painful urination.  Denies n/v/d, denies fevers.

## 2020-05-15 NOTE — Discharge Instructions (Addendum)

## 2020-05-17 LAB — URINE CULTURE
Culture: 30000 — AB
Special Requests: NORMAL

## 2020-07-06 ENCOUNTER — Other Ambulatory Visit: Payer: Self-pay

## 2020-07-06 ENCOUNTER — Emergency Department
Admission: EM | Admit: 2020-07-06 | Discharge: 2020-07-06 | Disposition: A | Discharge status: Home / Self Care | Payer: Medicare Other | Attending: Emergency Medicine | Admitting: Emergency Medicine

## 2020-07-06 ENCOUNTER — Emergency Department: Payer: Medicare Other

## 2020-07-06 DIAGNOSIS — Z5321 Procedure and treatment not carried out due to patient leaving prior to being seen by health care provider: Secondary | ICD-10-CM | POA: Diagnosis not present

## 2020-07-06 DIAGNOSIS — N390 Urinary tract infection, site not specified: Secondary | ICD-10-CM | POA: Diagnosis not present

## 2020-07-06 DIAGNOSIS — R309 Painful micturition, unspecified: Secondary | ICD-10-CM | POA: Insufficient documentation

## 2020-07-06 DIAGNOSIS — R109 Unspecified abdominal pain: Secondary | ICD-10-CM | POA: Diagnosis not present

## 2020-07-06 LAB — URINALYSIS, COMPLETE (UACMP) WITH MICROSCOPIC
Bilirubin Urine: NEGATIVE
Glucose, UA: NEGATIVE mg/dL
Ketones, ur: NEGATIVE mg/dL
Nitrite: NEGATIVE
Protein, ur: 100 mg/dL — AB
RBC / HPF: >50 RBC/hpf — ABNORMAL HIGH (ref 0–5)
Specific Gravity, Urine: 1.018 (ref 1.005–1.030)
Squamous Epithelial / HPF: NONE SEEN (ref 0–5)
WBC, UA: >50 WBC/hpf — ABNORMAL HIGH (ref 0–5)
pH: 5 (ref 5.0–8.0)

## 2020-07-06 LAB — CBC
HCT: 41.2 % (ref 36.0–46.0)
Hemoglobin: 13.8 g/dL (ref 12.0–15.0)
MCH: 27.9 pg (ref 26.0–34.0)
MCHC: 33.5 g/dL (ref 30.0–36.0)
MCV: 83.2 fL (ref 80.0–100.0)
Platelets: 230 10*3/uL (ref 150–400)
RBC: 4.95 MIL/uL (ref 3.87–5.11)
RDW: 13.2 % (ref 11.5–15.5)
WBC: 8.6 10*3/uL (ref 4.0–10.5)
nRBC: 0 % (ref 0.0–0.2)

## 2020-07-06 LAB — BASIC METABOLIC PANEL
Anion gap: 7 (ref 5–15)
BUN: 17 mg/dL (ref 8–23)
CO2: 23 mmol/L (ref 22–32)
Calcium: 9.9 mg/dL (ref 8.9–10.3)
Chloride: 106 mmol/L (ref 98–111)
Creatinine, Ser: 1.03 mg/dL — ABNORMAL HIGH (ref 0.44–1.00)
GFR, Estimated: 55 mL/min — ABNORMAL LOW (ref >60)
Glucose, Bld: 127 mg/dL — ABNORMAL HIGH (ref 70–99)
Potassium: 3.8 mmol/L (ref 3.5–5.1)
Sodium: 136 mmol/L (ref 135–145)

## 2020-07-06 NOTE — ED Triage Notes (Signed)
Pt to ED POV for chief complaint of uti sx. Urge, burning and frequency to urinate with right flank pain.  Reports hx of UTI's

## 2020-07-09 LAB — URINE CULTURE: Culture: >=100000 — AB

## 2021-04-12 DIAGNOSIS — Z23 Encounter for immunization: Secondary | ICD-10-CM | POA: Diagnosis not present

## 2022-04-26 ENCOUNTER — Emergency Department: Payer: Medicare Other

## 2022-04-26 ENCOUNTER — Emergency Department
Admission: EM | Admit: 2022-04-26 | Discharge: 2022-04-26 | Disposition: A | Discharge status: Home / Self Care | Payer: Medicare Other | Attending: Emergency Medicine | Admitting: Emergency Medicine

## 2022-04-26 DIAGNOSIS — N3000 Acute cystitis without hematuria: Secondary | ICD-10-CM | POA: Insufficient documentation

## 2022-04-26 DIAGNOSIS — U071 COVID-19: Secondary | ICD-10-CM | POA: Diagnosis not present

## 2022-04-26 DIAGNOSIS — R4182 Altered mental status, unspecified: Secondary | ICD-10-CM | POA: Diagnosis present

## 2022-04-26 LAB — URINALYSIS, ROUTINE W REFLEX MICROSCOPIC
Bacteria, UA: NONE SEEN
Bilirubin Urine: NEGATIVE
Glucose, UA: NEGATIVE mg/dL
Hgb urine dipstick: NEGATIVE
Ketones, ur: 5 mg/dL — AB
Nitrite: NEGATIVE
Protein, ur: 30 mg/dL — AB
Specific Gravity, Urine: 1.024 (ref 1.005–1.030)
WBC, UA: >50 WBC/hpf — ABNORMAL HIGH (ref 0–5)
pH: 5 (ref 5.0–8.0)

## 2022-04-26 LAB — CBC WITH DIFFERENTIAL/PLATELET
Abs Immature Granulocytes: 0.02 10*3/uL (ref 0.00–0.07)
Basophils Absolute: 0 10*3/uL (ref 0.0–0.1)
Basophils Relative: 1 %
Eosinophils Absolute: 0 10*3/uL (ref 0.0–0.5)
Eosinophils Relative: 1 %
HCT: 40.4 % (ref 36.0–46.0)
Hemoglobin: 13.5 g/dL (ref 12.0–15.0)
Immature Granulocytes: 0 %
Lymphocytes Relative: 18 %
Lymphs Abs: 1 10*3/uL (ref 0.7–4.0)
MCH: 28.4 pg (ref 26.0–34.0)
MCHC: 33.4 g/dL (ref 30.0–36.0)
MCV: 84.9 fL (ref 80.0–100.0)
Monocytes Absolute: 0.8 10*3/uL (ref 0.1–1.0)
Monocytes Relative: 16 %
Neutro Abs: 3.4 10*3/uL (ref 1.7–7.7)
Neutrophils Relative %: 64 %
Platelets: 171 10*3/uL (ref 150–400)
RBC: 4.76 MIL/uL (ref 3.87–5.11)
RDW: 12.4 % (ref 11.5–15.5)
WBC: 5.2 10*3/uL (ref 4.0–10.5)
nRBC: 0 % (ref 0.0–0.2)

## 2022-04-26 LAB — COMPREHENSIVE METABOLIC PANEL
ALT: 14 U/L (ref 0–44)
AST: 32 U/L (ref 15–41)
Albumin: 4 g/dL (ref 3.5–5.0)
Alkaline Phosphatase: 56 U/L (ref 38–126)
Anion gap: 11 (ref 5–15)
BUN: 15 mg/dL (ref 8–23)
CO2: 22 mmol/L (ref 22–32)
Calcium: 10.3 mg/dL (ref 8.9–10.3)
Chloride: 104 mmol/L (ref 98–111)
Creatinine, Ser: 1.06 mg/dL — ABNORMAL HIGH (ref 0.44–1.00)
GFR, Estimated: 53 mL/min — ABNORMAL LOW (ref >60)
Glucose, Bld: 85 mg/dL (ref 70–99)
Potassium: 3.9 mmol/L (ref 3.5–5.1)
Sodium: 137 mmol/L (ref 135–145)
Total Bilirubin: 1 mg/dL (ref 0.3–1.2)
Total Protein: 7.4 g/dL (ref 6.5–8.1)

## 2022-04-26 LAB — TROPONIN I (HIGH SENSITIVITY): Troponin I (High Sensitivity): 12 ng/L (ref <18)

## 2022-04-26 MED ORDER — FOSFOMYCIN TROMETHAMINE 3 G PO PACK
3.0000 g | PACK | Freq: Once | ORAL | Status: AC
  Administered 2022-04-26: 3 g via ORAL
  Filled 2022-04-26: qty 3

## 2022-04-26 NOTE — ED Provider Notes (Signed)
[facility] Provider Note    Event Date/Time   First MD Initiated Contact with Patient 04/26/22 1926     (approximate)   History   Altered Mental Status   HPI  [name] is a 81 y.o. female who comes in after being diagnosed with COVID yesterday as well as a UTI.  Patient reports that she lives by herself.  Patient was COVID-positive on 11/6.  It does appear the patient was prescribed molnupiravir.  She then went to [facility] this morning but seemed confused and did not remember being prescribed and told she had COVID so sent here for evaluation.  On discussion with patient she is alert and oriented x3 although she states that she thought she was being seen yesterday for UTI.  She does not remember being told that she had COVID.  She states that she thinks that she picked up the medication but she is not 100% sure.  She denies any worsening shortness of breath just reports having a lot of cough and congestion.  She does report some dysuria as well but denies any abdominal pain or back pain or fevers.   Physical Exam   Triage Vital Signs: ED Triage Vitals  Enc Vitals Group     BP 04/26/22 1510 124/60     Pulse Rate 04/26/22 1510 81     Resp 04/26/22 1510 18     Temp 04/26/22 1510 98.5 F (36.9 C)     Temp Source 04/26/22 1510 Oral     SpO2 04/26/22 1510 96 %     Weight 04/26/22 1518 145 lb (65.8 kg)     Height --      Head Circumference --      Peak Flow --      Pain Score 04/26/22 1518 0     Pain Loc --      Pain Edu? --      Excl. in [place]? --     Most recent vital signs: Vitals:   04/26/22 1518 04/26/22 1850  BP: 129/66 (!) 144/73  Pulse: (!) 57 63  Resp: 17 16  Temp: 98.4 F (36.9 C) 98.3 F (36.8 C)  SpO2: 97% 98%     General: Awake, no distress.  CV:  Good peripheral perfusion.  Resp:  Normal effort.  Abd:  No distention.  Soft and nontender.  No CVA tenderness Other:  Patient is alert and oriented x3.  She seems a  little bit confused with knowing the name of the medications and being told that she had COVID.   ED Results / Procedures / Treatments   Labs (all labs ordered are listed, but only abnormal results are displayed) Labs Reviewed  COMPREHENSIVE METABOLIC PANEL - Abnormal; Notable for the following components:      Result Value   Creatinine, Ser 1.06 (*)    GFR, Estimated 53 (*)    All other components within normal limits  URINALYSIS, ROUTINE W REFLEX MICROSCOPIC - Abnormal; Notable for the following components:   Color, Urine YELLOW (*)    APPearance CLOUDY (*)    Ketones, ur 5 (*)    Protein, ur 30 (*)    Leukocytes,Ua LARGE (*)    WBC, UA >50 (*)    All other components within normal limits  CBC WITH DIFFERENTIAL/PLATELET  CBC WITH DIFFERENTIAL/PLATELET     EKG  My interpretation of EKG:  Sinus bradycardia rate of 58 without any ST elevation or T wave inversions, normal  intervals  RADIOLOGY I have reviewed the xray personally and interpreted and no signs of any pneumonia  PROCEDURES:  Critical Care performed: No  .1-3 Lead EKG Interpretation  Performed by: Vanessa Broxton, MD Authorized by: Vanessa Castalia, MD     Interpretation: normal     ECG rate:  60   ECG rate assessment: normal     Rhythm: sinus rhythm     Ectopy: none     Conduction: normal      MEDICATIONS ORDERED IN ED: Medications  fosfomycin (MONUROL) packet 3 g (3 g Oral Given 04/26/22 2028)     IMPRESSION / MDM / ASSESSMENT AND PLAN / ED COURSE  I reviewed the triage vital signs and the nursing notes.   Patient's presentation is most consistent with acute presentation with potential threat to life or bodily function.   Patient comes in with some confusion, COVID and possible UTI.  Labs ordered evaluate for any Electra abnormalities, AKI, CT head evaluate for intracranial hemorrhage  CBC reassuring CMP similar creatinine.  Urine with some squamous cells concerning for possible UTI.  Will send  for urine culture.  CT head negative.  Chest x-ray without any pneumonia  Patient has a significant amount of allergies to antibiotics discussed with pharmacy and they recommended fosfomycin given no fevers and her E. coli's in the past have been pansensitive.  Given the confusion I initially recommended admission to the patient but patient is adamant that she does not want to stay.  I will discuss with patient's next POA her daughter to try to get more information to see if this confusion is new or old  Given the concern for some confusion in regards to her COVID her living by herself I was able to call the patient's daughter.  I discussed concerns with the confusion that if it was a new acute thing that I would recommend admission for altered mental status from UTI COVID-however patient's daughter reports over the past few months now she has had some increased issues with memory that is short-term however she has uses a lot of sticky notes at home to help remind her to do things and that she denies having any concerns for her being a danger to herself or others.  She reports that she does drive fine and denies any concerns about her driving.  The daughter was able to call the patient who reports that she seems to be at her baseline self and that she did not feel like we needed to put her under IVC and force her to stay given this sounds more of a chronic issue and that she does not appear to be a danger to herself and that she has been routinely driving without any issues.  However I did did discuss with them need to follow-up with her primary care doctor, neurology for further screening for dementia.  I was also able to figure out which pharmacy the meds were sent to and discussed with the daughter so that she can help make sure the meds are picked up tomorrow.  I so there is also a prescription for Tussionex that was sent over and I recommend they did not use this due to concern that could cause  worsening confusion she expressed understanding.    The patient is on the cardiac monitor to evaluate for evidence of arrhythmia and/or significant heart rate changes.    FINAL CLINICAL IMPRESSION(S) / ED DIAGNOSES   Final diagnoses:  Acute cystitis without hematuria  COVID-19     Rx / DC Orders   ED Discharge Orders     None        Note:  This document was prepared using Dragon voice recognition software and may include unintentional dictation errors.   Concha Se, MD 04/26/22 2107

## 2022-04-26 NOTE — ED Triage Notes (Signed)
Pt sts that she has been having the crud. Pt was over at [facility] and was dx with covid yesterday and pt sts that she was here yesterday too for a UTI. Pt is A/Ox2. Pt sts that she lives by herself.

## 2022-04-26 NOTE — Discharge Instructions (Addendum)
Take the [facility] for your COVID Positive Test.   DO NOT TAKE HYDROcodone-chlorpheniramine (Ponderay) as this can cause dizziness/confusion.   These were filled at [address] [facility] - Please go pick them up tomorrow.   We have already treated the UTI with a one time dose of antibiotics.   Call your primary doctor to discuss the confusion.

## 2022-04-26 NOTE — ED Provider Triage Note (Signed)
  Emergency Medicine Provider Triage Evaluation Note  [name] , a 81 y.o.female,  was evaluated in triage.  Pt complains of AMS.  She was recently diagnosed with COVID-19 and placed on molnupiravir.  She was seen at [facility] clinic today, however staff noted that she had no recollection of anything that occurred the day before.  She is unable to tell what day of the week it is or what month it is.  Endorses some cough/congestion and headache at this time.  No other symptoms.   Review of Systems  Positive: AMS, cough/congestion, headache Negative: Denies fever, chest pain, vomiting  Physical Exam   Vitals:   04/26/22 1510 04/26/22 1518  BP: 124/60 129/66  Pulse: 81 (!) 57  Resp: 18 17  Temp: 98.5 F (36.9 C) 98.4 F (36.9 C)  SpO2: 96% 97%   Gen:   Awake, no distress   Resp:  Normal effort  MSK:   Moves extremities without difficulty  Other:    Medical Decision Making  Given the patient's initial medical screening exam, the following diagnostic evaluation has been ordered. The patient will be placed in the appropriate treatment space, once one is available, to complete the evaluation and treatment. I have discussed the plan of care with the patient and I have advised the patient that an ED physician or mid-level practitioner will reevaluate their condition after the test results have been received, as the results may give them additional insight into the type of treatment they may need.    Diagnostics: Labs, UA, EKG, head CT  Treatments: none immediately   Teodoro Spray, [place] 04/26/22 1520

## 2022-04-28 LAB — URINE CULTURE: Culture: 20000 — AB

## 2022-07-30 ENCOUNTER — Emergency Department
Admission: EM | Admit: 2022-07-30 | Discharge: 2022-07-30 | Disposition: A | Discharge status: Home / Self Care | Payer: Medicare Other | Attending: Student in an Organized Health Care Education/Training Program | Admitting: Student in an Organized Health Care Education/Training Program

## 2022-07-30 ENCOUNTER — Other Ambulatory Visit: Payer: Self-pay

## 2022-07-30 DIAGNOSIS — N189 Chronic kidney disease, unspecified: Secondary | ICD-10-CM | POA: Insufficient documentation

## 2022-07-30 DIAGNOSIS — N3000 Acute cystitis without hematuria: Secondary | ICD-10-CM

## 2022-07-30 DIAGNOSIS — I4891 Unspecified atrial fibrillation: Secondary | ICD-10-CM | POA: Diagnosis not present

## 2022-07-30 DIAGNOSIS — R300 Dysuria: Secondary | ICD-10-CM | POA: Diagnosis present

## 2022-07-30 DIAGNOSIS — I129 Hypertensive chronic kidney disease with stage 1 through stage 4 chronic kidney disease, or unspecified chronic kidney disease: Secondary | ICD-10-CM | POA: Diagnosis not present

## 2022-07-30 LAB — CBC WITH DIFFERENTIAL/PLATELET
Abs Immature Granulocytes: 0.02 10*3/uL (ref 0.00–0.07)
Basophils Absolute: 0 10*3/uL (ref 0.0–0.1)
Basophils Relative: 1 %
Eosinophils Absolute: 0.1 10*3/uL (ref 0.0–0.5)
Eosinophils Relative: 2 %
HCT: 40.4 % (ref 36.0–46.0)
Hemoglobin: 13.8 g/dL (ref 12.0–15.0)
Immature Granulocytes: 0 %
Lymphocytes Relative: 23 %
Lymphs Abs: 1.6 10*3/uL (ref 0.7–4.0)
MCH: 28.4 pg (ref 26.0–34.0)
MCHC: 34.2 g/dL (ref 30.0–36.0)
MCV: 83.1 fL (ref 80.0–100.0)
Monocytes Absolute: 0.5 10*3/uL (ref 0.1–1.0)
Monocytes Relative: 7 %
Neutro Abs: 4.8 10*3/uL (ref 1.7–7.7)
Neutrophils Relative %: 67 %
Platelets: 234 10*3/uL (ref 150–400)
RBC: 4.86 MIL/uL (ref 3.87–5.11)
RDW: 13 % (ref 11.5–15.5)
WBC: 7.1 10*3/uL (ref 4.0–10.5)
nRBC: 0 % (ref 0.0–0.2)

## 2022-07-30 LAB — URINALYSIS, ROUTINE W REFLEX MICROSCOPIC
Bilirubin Urine: NEGATIVE
Glucose, UA: NEGATIVE mg/dL
Hgb urine dipstick: NEGATIVE
Ketones, ur: NEGATIVE mg/dL
Nitrite: POSITIVE — AB
Protein, ur: 30 mg/dL — AB
Specific Gravity, Urine: 1.015 (ref 1.005–1.030)
WBC, UA: >50 WBC/hpf (ref 0–5)
pH: 6 (ref 5.0–8.0)

## 2022-07-30 LAB — COMPREHENSIVE METABOLIC PANEL
ALT: 15 U/L (ref 0–44)
AST: 29 U/L (ref 15–41)
Albumin: 4.2 g/dL (ref 3.5–5.0)
Alkaline Phosphatase: 58 U/L (ref 38–126)
Anion gap: 9 (ref 5–15)
BUN: 19 mg/dL (ref 8–23)
CO2: 26 mmol/L (ref 22–32)
Calcium: 10.1 mg/dL (ref 8.9–10.3)
Chloride: 102 mmol/L (ref 98–111)
Creatinine, Ser: 0.89 mg/dL (ref 0.44–1.00)
GFR, Estimated: >60 mL/min (ref >60)
Glucose, Bld: 97 mg/dL (ref 70–99)
Potassium: 3.8 mmol/L (ref 3.5–5.1)
Sodium: 137 mmol/L (ref 135–145)
Total Bilirubin: 1 mg/dL (ref 0.3–1.2)
Total Protein: 7.6 g/dL (ref 6.5–8.1)

## 2022-07-30 MED ORDER — CIPROFLOXACIN HCL 500 MG PO TABS: 500.0000 mg | ORAL_TABLET | Freq: Two times a day (BID) | ORAL | 0 refills | Status: DC

## 2022-07-30 MED ORDER — CIPROFLOXACIN HCL 500 MG PO TABS: 500.0000 mg | ORAL_TABLET | Freq: Two times a day (BID) | ORAL | 0 refills | Status: AC

## 2022-07-30 NOTE — ED Provider Notes (Signed)
[facility] Provider Note  Patient Contact: 3:38 PM (approximate)   History   Dysuria (Patient presents with dysuria and frequent urination that began yesterday; Patient states that she does have frequent UTIs)   HPI  [name] is a 82 y.o. female with a history of A-fib, hypertension, chronic kidney disease, GERD and arthritis, presents to the emergency department with dysuria and increased urinary frequency.  Patient reports that she is prone to urinary tract infections and her last urinary tract infection was in November.  She denies low back pain, nausea, abdominal pain or pelvic pain.  No chest pain, chest tightness or shortness of breath.  No fever at home.      Physical Exam   Triage Vital Signs: ED Triage Vitals  Enc Vitals Group     BP 07/30/22 1425 (!) 173/71     Pulse Rate 07/30/22 1425 (!) 55     Resp 07/30/22 1425 18     Temp 07/30/22 1425 97.6 F (36.4 C)     Temp Source 07/30/22 1425 Oral     SpO2 07/30/22 1425 96 %     Weight --      Height 07/30/22 1426 5' 5"$  (1.651 m)     Head Circumference --      Peak Flow --      Pain Score 07/30/22 1426 2     Pain Loc --      Pain Edu? --      Excl. in [place]? --     Most recent vital signs: Vitals:   07/30/22 1425  BP: (!) 173/71  Pulse: (!) 55  Resp: 18  Temp: 97.6 F (36.4 C)  SpO2: 96%     General: Alert and in no acute distress. Eyes:  PERRL. EOMI. Head: No acute traumatic findings ENT:      Nose: No congestion/rhinnorhea.      Mouth/Throat: Mucous membranes are moist. Neck: No stridor. No cervical spine tenderness to palpation. Cardiovascular:  Good peripheral perfusion Respiratory: Normal respiratory effort without tachypnea or retractions. Lungs CTAB. Good air entry to the bases with no decreased or absent breath sounds. Gastrointestinal: Bowel sounds 4 quadrants. Soft and nontender to palpation. No guarding or rigidity. No palpable masses. No distention. No CVA  tenderness. Musculoskeletal: Full range of motion to all extremities.  Neurologic:  No gross focal neurologic deficits are appreciated.  Skin:   No rash noted    ED Results / Procedures / Treatments   Labs (all labs ordered are listed, but only abnormal results are displayed) Labs Reviewed  URINALYSIS, ROUTINE W REFLEX MICROSCOPIC - Abnormal; Notable for the following components:      Result Value   Color, Urine YELLOW (*)    APPearance CLOUDY (*)    Protein, ur 30 (*)    Nitrite POSITIVE (*)    Leukocytes,Ua LARGE (*)    Bacteria, UA FEW (*)    All other components within normal limits  URINE CULTURE  CBC WITH DIFFERENTIAL/PLATELET  COMPREHENSIVE METABOLIC PANEL     PROCEDURES:  Critical Care performed: No  Procedures   MEDICATIONS ORDERED IN ED: Medications - No data to display   IMPRESSION / MDM / [place] / ED COURSE  I reviewed the triage vital signs and the nursing notes.                              Assessment and plan:  Dysuria 82 year old female presents to the emergency department with dysuria and increased urinary frequency for the past 2 days.  Patient was hypertensive and bradycardic at triage but vital signs were otherwise reassuring.  On exam, patient alert, active and nontoxic-appearing.  As it has been several months since patient has had basic labs, will obtain CBC and CMP and will reassess.  Urinalysis concerning for UTI.  Urine culture in process.   CBC and CMP reassuring.  Patient has multiple antibiotic allergies.  I reviewed patient's medical record and see that she is tolerated Cipro in the past for UTIs.  Will start patient on ciprofloxacin twice daily for the next 7 days.  Return precautions were given to return with new or worsening symptoms.  FINAL CLINICAL IMPRESSION(S) / ED DIAGNOSES   Final diagnoses:  Acute cystitis without hematuria     Rx / DC Orders   ED Discharge Orders          Ordered    ciprofloxacin  (CIPRO) 500 MG tablet  2 times daily        07/30/22 1639             Note:  This document was prepared using Dragon voice recognition software and may include unintentional dictation errors.   Vallarie Mare Hubbard, [place] 07/30/22 1646    Merlyn Lot, MD 07/30/22 Carollee Massed

## 2022-07-30 NOTE — ED Triage Notes (Addendum)
Patient presents with dysuria and frequent urination that began yesterday; Patient states that she does have frequent UTIs

## 2022-07-30 NOTE — Discharge Instructions (Addendum)
Take Cipro twice daily for seven days.

## 2022-08-01 LAB — URINE CULTURE: Culture: >=100000 — AB

## 2022-11-10 IMAGING — CT CT RENAL STONE PROTOCOL
2 of 4 series · 16 of 46 positions shown, 18 images · non-contrast
Comparison: None.

CLINICAL DATA: Pelvic pain and pressure. Not able to urinate much
with painful urination. Suspected hematuria.

EXAM:
CT ABDOMEN AND PELVIS WITHOUT CONTRAST
TECHNIQUE: Multidetector CT imaging of the abdomen and pelvis was performed
following the standard protocol without IV contrast.

[Series 2: stone full standard · axial · 0.82mm/px · z∈[-877,-452]mm · 13 of 95 slices shown, 15 images]
[im 5/95  soft-tissue]
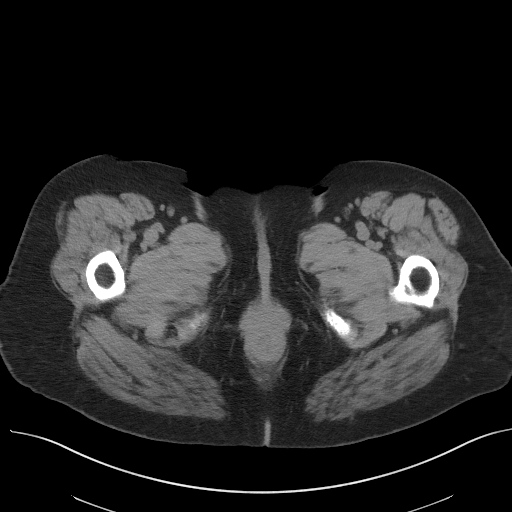
[im 5/95  bone]
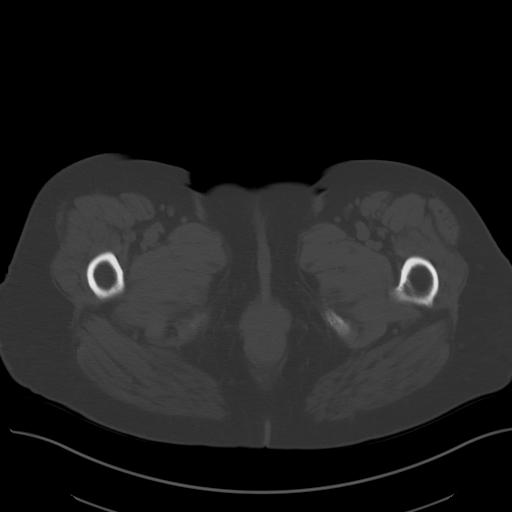
[im 13/95  soft-tissue]
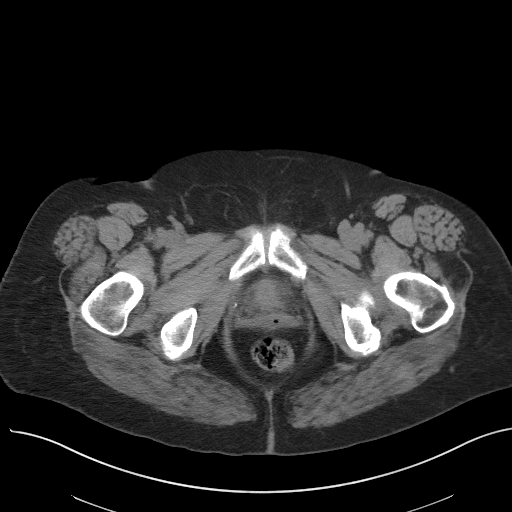
[im 21/95  soft-tissue]
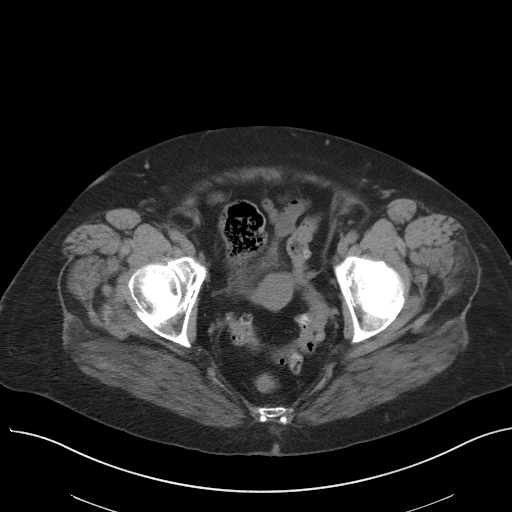
[im 25/95  soft-tissue]
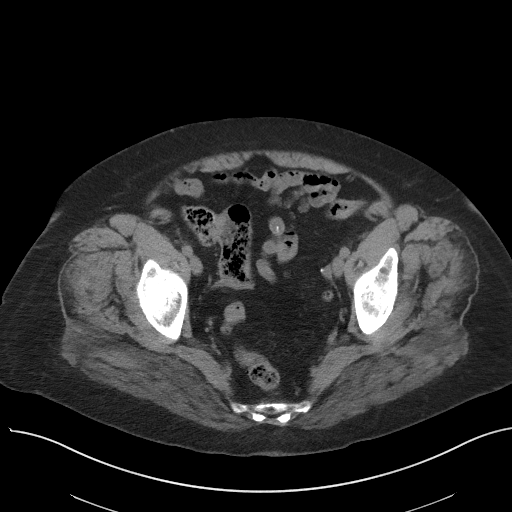
[im 33/95  soft-tissue]
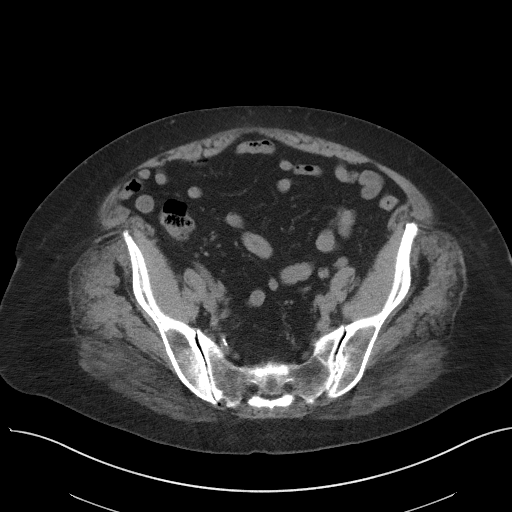
[im 41/95  soft-tissue]
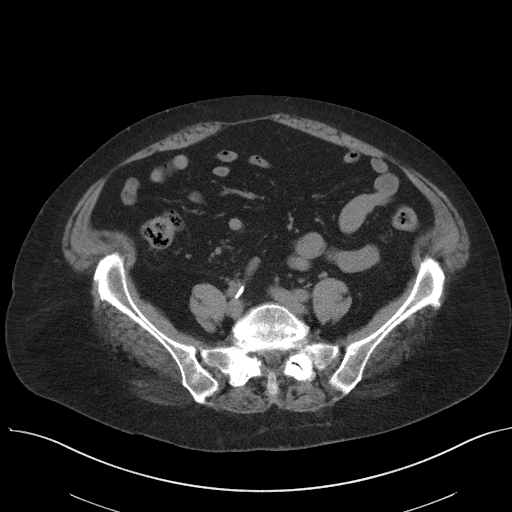
[im 50/95  soft-tissue]
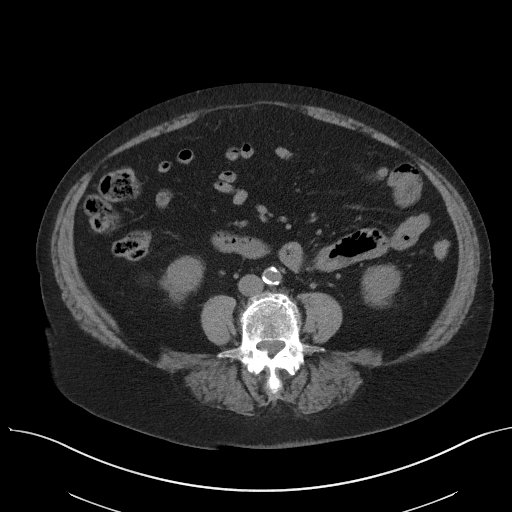
[im 54/95  soft-tissue]
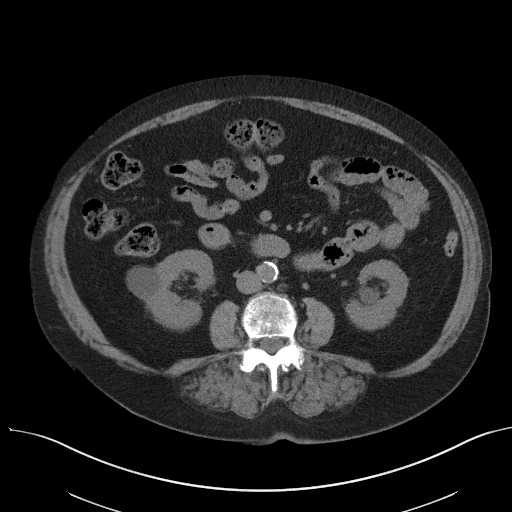
[im 62/95  soft-tissue]
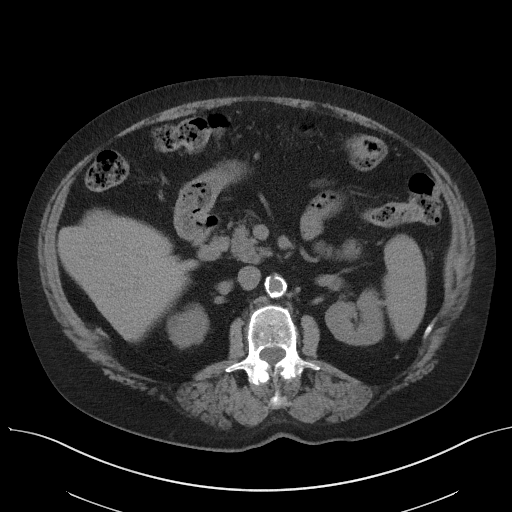
[im 62/95  bone]
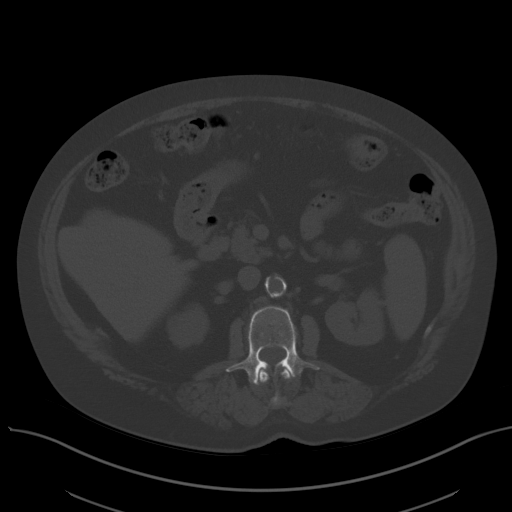
[im 70/95  soft-tissue]
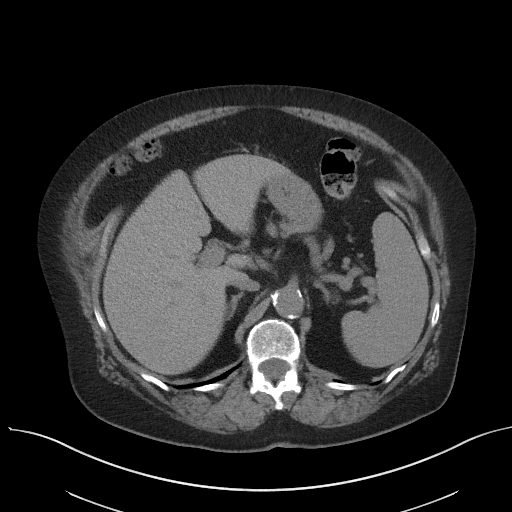
[im 74/95  soft-tissue]
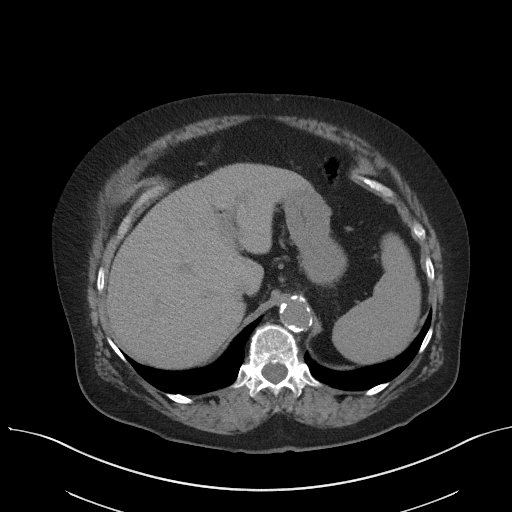
[im 82/95  soft-tissue]
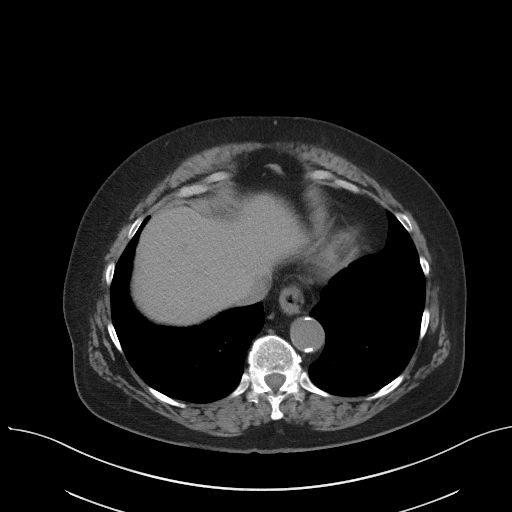
[im 90/95  soft-tissue]
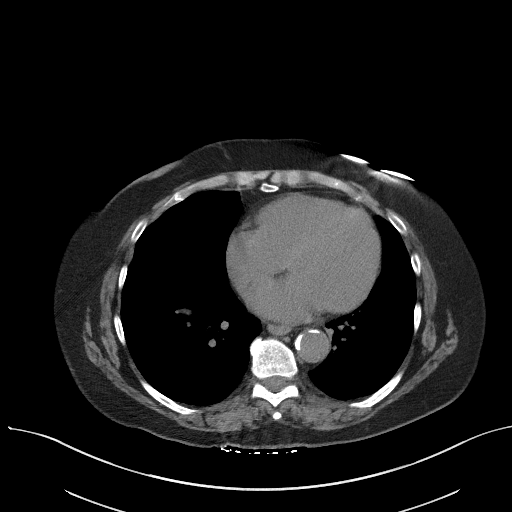

[Series 5: coronal · coronal · 0.81mm/px · 3 of 143 slices shown]
[im 48/143  soft-tissue]
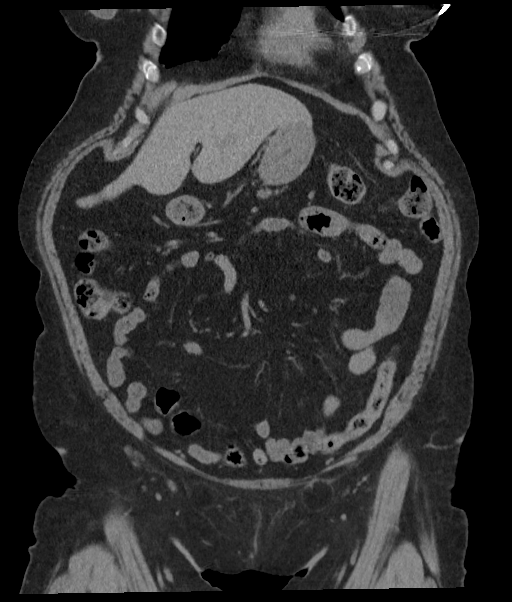
[im 64/143  soft-tissue]
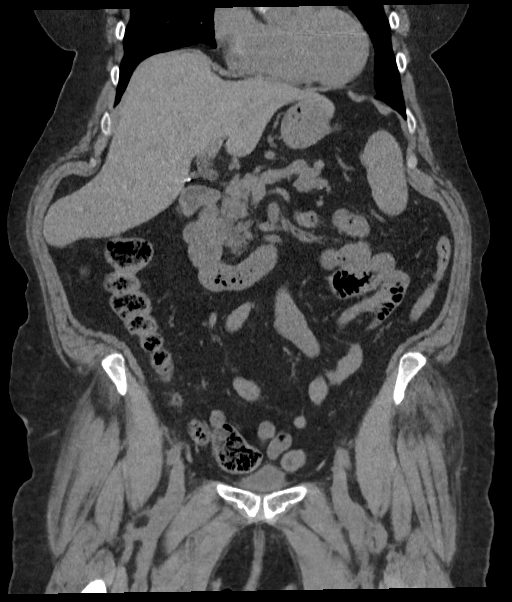
[im 79/143  soft-tissue]
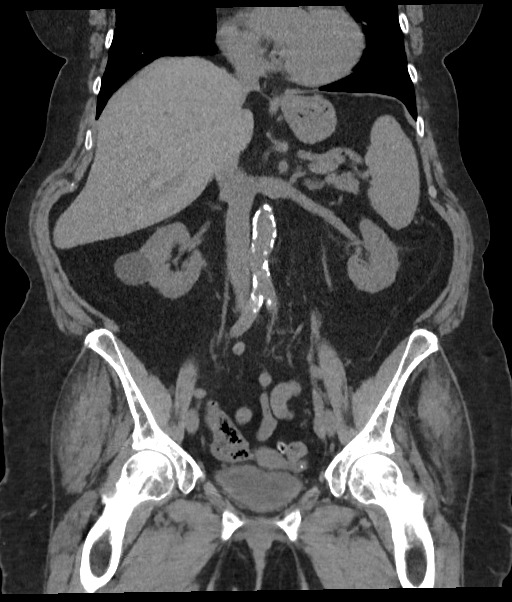

[16 of 46 positions shown; findings below may reference images not displayed]

FINDINGS: Lower chest: The lung bases are clear. Partial visualization of
probable bilateral breast implants.

Hepatobiliary: No focal liver abnormality is seen. Status post
cholecystectomy. No biliary dilatation.

Pancreas: Unremarkable. No pancreatic ductal dilatation or
surrounding inflammatory changes.

Spleen: Normal in size without focal abnormality.

Adrenals/Urinary Tract: Adrenal glands are unremarkable. Right renal
cyst. Kidneys are otherwise normal, without renal calculi, focal
solid lesion, or hydronephrosis. Bladder is unremarkable.

Stomach/Bowel: Stomach is within normal limits. Appendix appears
normal. No evidence of bowel wall thickening, distention, or
inflammatory changes. Diverticulosis of the sigmoid colon without
evidence of diverticulitis.

Vascular/Lymphatic: Aortic atherosclerosis. No enlarged abdominal or
pelvic lymph nodes.

Reproductive: Uterus and bilateral adnexa are unremarkable.

Other: Small bilateral inguinal hernias containing fat. No free air
or free fluid in the abdomen.

Musculoskeletal: Degenerative changes in the spine. No destructive
bone lesions.
IMPRESSION: 1. No acute process demonstrated in the abdomen or pelvis. No renal
or ureteral stone or obstruction. Diverticulosis of the sigmoid
colon without evidence of diverticulitis.
2. Small bilateral inguinal hernias containing fat.
3. Aortic atherosclerosis.

Aortic Atherosclerosis ([id number]).

## 2023-02-20 ENCOUNTER — Emergency Department
Admission: EM | Admit: 2023-02-20 | Discharge: 2023-02-20 | Disposition: A | Discharge status: Home / Self Care | Payer: Medicare Other | Source: Home / Self Care | Attending: Emergency Medicine | Admitting: Emergency Medicine

## 2023-02-20 ENCOUNTER — Encounter: Payer: Self-pay | Admitting: Emergency Medicine

## 2023-02-20 DIAGNOSIS — R350 Frequency of micturition: Secondary | ICD-10-CM | POA: Diagnosis present

## 2023-02-20 DIAGNOSIS — N3000 Acute cystitis without hematuria: Secondary | ICD-10-CM

## 2023-02-20 DIAGNOSIS — Z96651 Presence of right artificial knee joint: Secondary | ICD-10-CM | POA: Diagnosis not present

## 2023-02-20 LAB — URINALYSIS, MICROSCOPIC (REFLEX): WBC, UA: >50 WBC/hpf (ref 0–5)

## 2023-02-20 LAB — URINALYSIS, ROUTINE W REFLEX MICROSCOPIC
Bilirubin Urine: NEGATIVE
Glucose, UA: NEGATIVE mg/dL
Ketones, ur: NEGATIVE mg/dL
Nitrite: NEGATIVE
Protein, ur: NEGATIVE mg/dL
Specific Gravity, Urine: 1.02 (ref 1.005–1.030)
pH: 7 (ref 5.0–8.0)

## 2023-02-20 MED ORDER — CIPROFLOXACIN HCL 500 MG PO TABS: 500.0000 mg | ORAL_TABLET | Freq: Two times a day (BID) | ORAL | 0 refills | Status: AC

## 2023-02-20 NOTE — ED Provider Notes (Signed)
[facility] Provider Note    Event Date/Time   First MD Initiated Contact with Patient 02/20/23 1320     (approximate)   History   Urinary Tract Infection   HPI  [name] is a 82 y.o. female who presents today for evaluation of burning with urination and urinary urgency and frequency that began yesterday.  Patient reports that she has had many urinary tract infections in the past and this feels similar.  She reports that she has a hard time wiping after moving her bowels because of arthritis and she dabs from the front.  No fevers or chills.  No abdominal pain or flank pain.  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          Physical Exam   Triage Vital Signs: ED Triage Vitals  Encounter Vitals Group     BP 02/20/23 1314 (!) 181/80     Systolic BP Percentile --      Diastolic BP Percentile --      Pulse Rate 02/20/23 1314 61     Resp 02/20/23 1314 16     Temp 02/20/23 1314 (!) 97.5 F (36.4 C)     Temp Source 02/20/23 1314 Oral     SpO2 02/20/23 1314 96 %     Weight 02/20/23 1314 145 lb 8.1 oz (66 kg)     Height 02/20/23 1314 5\' 5"  (1.651 m)     Head Circumference --      Peak Flow --      Pain Score 02/20/23 1313 1     Pain Loc --      Pain Education --      Exclude from Growth Chart --     Most recent vital signs: Vitals:   02/20/23 1314  BP: (!) 181/80  Pulse: 61  Resp: 16  Temp: (!) 97.5 F (36.4 C)  SpO2: 96%    Physical Exam Vitals and nursing note reviewed.  Constitutional:      General: Awake and alert. No acute distress.    Appearance: Normal appearance. The patient is normal weight.  HENT:     Head: Normocephalic and atraumatic.     Mouth: Mucous membranes are moist.  Eyes:     General: PERRL. Normal EOMs        Right eye: No discharge.        Left eye: No discharge.     Conjunctiva/sclera: Conjunctivae normal.  Cardiovascular:      Rate and Rhythm: Normal rate and regular rhythm.     Pulses: Normal pulses.  Pulmonary:     Effort: Pulmonary effort is normal. No respiratory distress.     Breath sounds: Normal breath sounds.  Abdominal:     Abdomen is soft. There is no abdominal tenderness. No rebound or guarding. No distention. Musculoskeletal:        General: No swelling. Normal range of motion.     Cervical back: Normal range of motion and neck supple.  Skin:    General: Skin is warm and dry.     Capillary Refill: Capillary refill takes less than 2 seconds.     Findings: No rash.  Neurological:     Mental Status: The patient is awake and alert.      ED Results / Procedures / Treatments   Labs (all labs ordered are listed, but only abnormal results are displayed) Labs Reviewed  URINALYSIS,  ROUTINE W REFLEX MICROSCOPIC - Abnormal; Notable for the following components:      Result Value   APPearance HAZY (*)    Hgb urine dipstick TRACE (*)    Leukocytes,Ua MODERATE (*)    All other components within normal limits  URINALYSIS, MICROSCOPIC (REFLEX) - Abnormal; Notable for the following components:   Bacteria, UA RARE (*)    All other components within normal limits     EKG     RADIOLOGY     PROCEDURES:  Critical Care performed:   Procedures   MEDICATIONS ORDERED IN ED: Medications - No data to display   IMPRESSION / MDM / ASSESSMENT AND PLAN / ED COURSE  I reviewed the triage vital signs and the nursing notes.   Differential diagnosis includes, but is not limited to, urinary tract infection, less likely yeast infection or pyelonephritis.  I reviewed the patient's chart.  Patient has been seen multiple times in the past for urinary tract infection, most recently on July 30, 2018 for which reveals pan sensitive E. coli.  Patient has numerous allergies and is given Cipro each time.  Urinalysis and symptomatology are consistent with urinary tract infection.  No flank pain to suggest  pyelonephritis.  No abdominal pain on exam.  We discussed ways to help minimize the recurrence of these in the future.  I am generally reluctant to give ciprofloxacin to those with advanced age given the side effect profile, though patient is requesting this medicine and reports numerous allergies to other agents.  We discussed at length the side effects of this medication.  We also discussed return precautions.  Patient understands and agrees with plan.  She was discharged in stable condition.   Patient's presentation is most consistent with acute complicated illness / injury requiring diagnostic workup.    FINAL CLINICAL IMPRESSION(S) / ED DIAGNOSES   Final diagnoses:  Acute cystitis without hematuria     Rx / DC Orders   ED Discharge Orders          Ordered    ciprofloxacin (CIPRO) 500 MG tablet  2 times daily        02/20/23 1344             Note:  This document was prepared using Dragon voice recognition software and may include unintentional dictation errors.   Jackelyn Hoehn, PA-C 02/20/23 1410    Pilar Jarvis, MD 02/20/23 [phone]

## 2023-02-20 NOTE — Discharge Instructions (Signed)
Please take the antibiotics as prescribed for your urinary tract infection.  Please return for any new, worsening, or change in symptoms or other concerns.  It was a pleasure caring for you today.

## 2023-02-20 NOTE — ED Triage Notes (Signed)
Pt endorses UTI symptoms since last night with burning. Pt brought UA sample in jar. Endorses pelvic discomfort right now and pain with urination.

## 2024-02-01 ENCOUNTER — Other Ambulatory Visit: Payer: Self-pay | Admitting: Internal Medicine

## 2024-02-01 DIAGNOSIS — R413 Other amnesia: Secondary | ICD-10-CM

## 2024-02-05 ENCOUNTER — Ambulatory Visit

## 2024-02-06 ENCOUNTER — Ambulatory Visit
Admission: RE | Admit: 2024-02-06 | Discharge: 2024-02-06 | Disposition: A | Discharge status: Home / Self Care | Source: Ambulatory Visit | Attending: Internal Medicine | Admitting: Internal Medicine

## 2024-02-06 DIAGNOSIS — R413 Other amnesia: Secondary | ICD-10-CM | POA: Diagnosis present
